# Patient Record
Sex: Male | Born: 1970 | Race: White | Hispanic: No | Marital: Married | Smoking: Former smoker
Health system: Southern US, Community
[De-identification: ages and names within clinical notes are randomized; demographics above are authoritative.]

## PROBLEM LIST (undated history)

## (undated) DIAGNOSIS — G2581 Restless legs syndrome: Secondary | ICD-10-CM

## (undated) DIAGNOSIS — E785 Hyperlipidemia, unspecified: Secondary | ICD-10-CM

## (undated) DIAGNOSIS — I10 Essential (primary) hypertension: Secondary | ICD-10-CM

## (undated) DIAGNOSIS — M549 Dorsalgia, unspecified: Secondary | ICD-10-CM

## (undated) DIAGNOSIS — G20 Parkinson's disease: Secondary | ICD-10-CM

## (undated) DIAGNOSIS — F4310 Post-traumatic stress disorder, unspecified: Secondary | ICD-10-CM

## (undated) DIAGNOSIS — G20A1 Parkinson's disease without dyskinesia, without mention of fluctuations: Secondary | ICD-10-CM

## (undated) DIAGNOSIS — M79609 Pain in unspecified limb: Secondary | ICD-10-CM

## (undated) DIAGNOSIS — J45909 Unspecified asthma, uncomplicated: Secondary | ICD-10-CM

## (undated) DIAGNOSIS — F329 Major depressive disorder, single episode, unspecified: Secondary | ICD-10-CM

## (undated) DIAGNOSIS — G4733 Obstructive sleep apnea (adult) (pediatric): Secondary | ICD-10-CM

## (undated) DIAGNOSIS — F319 Bipolar disorder, unspecified: Secondary | ICD-10-CM

## (undated) DIAGNOSIS — G259 Extrapyramidal and movement disorder, unspecified: Secondary | ICD-10-CM

## (undated) DIAGNOSIS — E559 Vitamin D deficiency, unspecified: Secondary | ICD-10-CM

## (undated) DIAGNOSIS — I219 Acute myocardial infarction, unspecified: Secondary | ICD-10-CM

## (undated) DIAGNOSIS — E119 Type 2 diabetes mellitus without complications: Secondary | ICD-10-CM

## (undated) HISTORY — PX: TONSILLECTOMY: SUR1361

## (undated) HISTORY — DX: Pain in unspecified limb: M79.609

## (undated) HISTORY — DX: Dorsalgia, unspecified: M54.9

## (undated) HISTORY — DX: Hyperlipidemia, unspecified: E78.5

## (undated) HISTORY — DX: Essential (primary) hypertension: I10

## (undated) HISTORY — DX: Obstructive sleep apnea (adult) (pediatric): G47.33

## (undated) HISTORY — DX: Unspecified asthma, uncomplicated: J45.909

## (undated) HISTORY — PX: KNEE SURGERY: SHX244

## (undated) HISTORY — DX: Extrapyramidal and movement disorder, unspecified: G25.9

## (undated) HISTORY — DX: Restless legs syndrome: G25.81

## (undated) HISTORY — PX: CHOLECYSTECTOMY: SHX55

## (undated) HISTORY — DX: Vitamin D deficiency, unspecified: E55.9

---

## 2013-08-14 HISTORY — PX: CARDIAC CATHETERIZATION: SHX172

## 2013-10-21 ENCOUNTER — Encounter: Payer: Self-pay | Admitting: *Deleted

## 2013-10-22 ENCOUNTER — Ambulatory Visit (INDEPENDENT_AMBULATORY_CARE_PROVIDER_SITE_OTHER): Payer: Medicaid Other | Admitting: Cardiology

## 2013-10-22 ENCOUNTER — Encounter: Payer: Self-pay | Admitting: Cardiology

## 2013-10-22 VITALS — BP 118/62 | HR 79 | Ht 68.0 in | Wt 208.0 lb

## 2013-10-22 DIAGNOSIS — R079 Chest pain, unspecified: Secondary | ICD-10-CM

## 2013-10-22 DIAGNOSIS — Z03810 Encounter for observation for suspected exposure to anthrax ruled out: Secondary | ICD-10-CM

## 2013-10-22 DIAGNOSIS — I10 Essential (primary) hypertension: Secondary | ICD-10-CM | POA: Insufficient documentation

## 2013-10-22 DIAGNOSIS — I200 Unstable angina: Secondary | ICD-10-CM

## 2013-10-22 DIAGNOSIS — E785 Hyperlipidemia, unspecified: Secondary | ICD-10-CM

## 2013-10-22 DIAGNOSIS — I2510 Atherosclerotic heart disease of native coronary artery without angina pectoris: Secondary | ICD-10-CM

## 2013-10-22 LAB — CBC WITH DIFFERENTIAL/PLATELET
Basophils Absolute: 0.1 10*3/uL (ref 0.0–0.1)
Basophils Relative: 0.7 % (ref 0.0–3.0)
Eosinophils Absolute: 0.2 10*3/uL (ref 0.0–0.7)
Eosinophils Relative: 2.4 % (ref 0.0–5.0)
HCT: 45.8 % (ref 39.0–52.0)
Hemoglobin: 15.6 g/dL (ref 13.0–17.0)
Lymphocytes Relative: 28.1 % (ref 12.0–46.0)
Lymphs Abs: 2.8 10*3/uL (ref 0.7–4.0)
MCHC: 34.1 g/dL (ref 30.0–36.0)
MCV: 94.4 fl (ref 78.0–100.0)
Monocytes Absolute: 1 10*3/uL (ref 0.1–1.0)
Monocytes Relative: 10 % (ref 3.0–12.0)
Neutro Abs: 5.8 10*3/uL (ref 1.4–7.7)
Neutrophils Relative %: 58.8 % (ref 43.0–77.0)
Platelets: 288 10*3/uL (ref 150.0–400.0)
RBC: 4.86 Mil/uL (ref 4.22–5.81)
RDW: 13.6 % (ref 11.5–14.6)
WBC: 9.8 10*3/uL (ref 4.5–10.5)

## 2013-10-22 LAB — BASIC METABOLIC PANEL
BUN: 11 mg/dL (ref 6–23)
CO2: 29 mEq/L (ref 19–32)
Calcium: 9.7 mg/dL (ref 8.4–10.5)
Chloride: 102 mEq/L (ref 96–112)
Creatinine, Ser: 0.9 mg/dL (ref 0.4–1.5)
GFR: 100.59 mL/min (ref >60.00)
Glucose, Bld: 90 mg/dL (ref 70–99)
Potassium: 4 mEq/L (ref 3.5–5.1)
Sodium: 137 mEq/L (ref 135–145)

## 2013-10-22 LAB — APTT: aPTT: 30 s — ABNORMAL HIGH (ref 21.7–28.8)

## 2013-10-22 MED ORDER — CARVEDILOL 3.125 MG PO TABS: 3.1250 mg | ORAL_TABLET | Freq: Two times a day (BID) | ORAL | Status: DC

## 2013-10-22 NOTE — Addendum Note (Signed)
Addended by: Lars MassonNELSON, Nurah Petrides H on: 10/22/2013 02:51 PM   Modules accepted: Orders

## 2013-10-22 NOTE — Patient Instructions (Addendum)
**Note De-Identified Samuel Williams Obfuscation** Your physician has requested that you have a cardiac catheterization. Cardiac catheterization is used to diagnose and/or treat various heart conditions. Doctors may recommend this procedure for a number of different reasons. The most common reason is to evaluate chest pain. Chest pain can be a symptom of coronary artery disease (CAD), and cardiac catheterization can show whether plaque is narrowing or blocking your heart's arteries. This procedure is also used to evaluate the valves, as well as measure the blood flow and oxygen levels in different parts of your heart. For further information please visit [email or web address] Please follow instruction sheet, as given.  Your physician has recommended you make the following change in your medication: start taking Carvedilol 3.125 twice daily   Your physician recommends that you return for lab work in: today  Your physician recommends that you schedule a follow-up appointment in: after cath

## 2013-10-22 NOTE — Addendum Note (Signed)
Addended by: Lars MassonNELSON, Slevin Gunby H on: 10/22/2013 02:19 PM   Modules accepted: Orders

## 2013-10-22 NOTE — Progress Notes (Signed)
Patient ID: Jahel A Berton, male   DOB: 08/06/1971, 43 y.o.   MRN: [id number]    Patient Name: Samuel Williams Date of Encounter: 10/22/2013  Primary Care Provider:  HAMRICK,MAURA L, MD Primary Cardiologist:  Dimond Crotty H  Problem List   Past Medical History  Diagnosis Date  . Restless legs syndrome (RLS)   . Obstructive sleep apnea (adult) (pediatric)   . Pain in limb   . 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 Cough   . 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 Other and unspecified hyperlipidemia   . Backache, unspecified   . Unspecified essential hypertension   . Diarrhea   . Unspecified vitamin D deficiency   . Pain in limb   . Unspecified asthma(493.90)   . Coronary atherosclerosis of unspecified type of vessel, native or graft    Past Surgical History  Procedure Laterality Date  . Cholecystectomy      Allergies  No Known Allergies  HPI  A 43-year-old male with prior medical history of hypertension, hyperlipidemia and known coronary artery disease. The patient states that in his early 30s he went to 2 different hospitals in [place] and was diagnosed with myocardial infarction on both occasions. He underwent cardiac catheterization that is not sure if he underwent any intervention or stenting. He moved to [place] 4 years ago and was followed with primary care physician only. For the last few months he has experienced profound fatigue and dyspnea on exertion. 3 weeks ago he had an episode of severe retrosternal chest pain that felt like his 2 prior episodes of myocardial infarction. At the time he was at the gas station he was also diaphoretic and was approached by an EMS personnel that found him confused and called EMS. He was taken to the [facility] where myocardial infarction was ruled out and an exercise nuclear stress test didn't show any ischemia. The patient has very significant family history of  coronary artery disease where every  male from the father's side has had a myocardial infarction as early as in their 20s. Exercise nuclear stress test showed normal ejection fraction, no EKG changes and no ischemia. No wall motion abnormalities. He exercised for 7 minutes on standard to do a Bruce protocol and achieved 8 METS.  Home Medications  Prior to Admission medications   Medication Sig Start Date End Date Taking? Authorizing Provider  aspirin 81 MG tablet Take 81 mg by mouth daily.   Yes Historical Provider, MD  cyclobenzaprine (FLEXERIL) 5 MG tablet Take 5 mg by mouth 3 (three) times daily as needed for muscle spasms.   Yes Historical Provider, MD  EPINEPHrine (EPIPEN 2-PAK) 0.3 mg/0.3 mL SOAJ injection Inject into the muscle once.   Yes Historical Provider, MD  gabapentin (NEURONTIN) 300 MG capsule Take 300 mg by mouth 3 (three) times daily.   Yes Historical Provider, MD  meloxicam (MOBIC) 15 MG tablet Take 15 mg by mouth daily.   Yes Historical Provider, MD  omeprazole (PRILOSEC) 20 MG capsule Take 20 mg by mouth daily.   Yes Historical Provider, MD  pravastatin (PRAVACHOL) 40 MG tablet Take 40 mg by mouth daily.   Yes Historical Provider, MD  primidone (MYSOLINE) 50 MG tablet Take by mouth 4 (four) times daily.   Yes Historical Provider, MD    Family History  Family History  Problem Relation Age of Onset  . Hypertension Father   . Heart disease Father   .   Hypercholesterolemia Father   . Thyroid disease Father   . Stroke Father   . Cancer Father   . Hypertension Paternal Grandfather   . Heart disease Paternal Grandfather   . Hypercholesterolemia Paternal Grandfather   . Stroke Paternal Grandfather   . Colon cancer Paternal Grandfather   . Diabetes Paternal Grandfather   . Hypertension Sister   . Hypercholesterolemia Sister   . Heart disease Maternal Grandfather   . Diabetes Maternal Grandfather   . Lung cancer Maternal Grandmother   . Diabetes Maternal Grandmother     . Diabetes Paternal Grandmother   . Arthritis    . Brain cancer Mother     Social History  History   Social History  . Marital Status: Single    Spouse Name: N/A    Number of Children: N/A  . Years of Education: N/A   Occupational History  . Not on file.   Social History Main Topics  . Smoking status: Former Smoker  . Smokeless tobacco: Current User    Types: Chew  . Alcohol Use: Yes     Comment: occasional  . Drug Use: No  . Sexual Activity: Not on file   Other Topics Concern  . Not on file   Social History Narrative  . No narrative on file     Review of Systems, as per HPI, otherwise negative General:  No chills, fever, night sweats or weight changes.  Cardiovascular:  No chest pain, dyspnea on exertion, edema, orthopnea, palpitations, paroxysmal nocturnal dyspnea. Dermatological: No rash, lesions/masses Respiratory: No cough, dyspnea Urologic: No hematuria, dysuria Abdominal:   No nausea, vomiting, diarrhea, bright red blood per rectum, melena, or hematemesis Neurologic:  No visual changes, wkns, changes in mental status. All other systems reviewed and are otherwise negative except as noted above.  Physical Exam  Blood pressure 118/62, pulse 79, height 5' 8" (1.727 m), weight 208 lb (94.348 kg).  General: Pleasant, NAD Psych: Normal affect. Neuro: Alert and oriented X 3. Moves all extremities spontaneously. HEENT: Normal  Neck: Supple without bruits or JVD. Lungs:  Resp regular and unlabored, CTA. Heart: RRR no s3, s4, or murmurs. Abdomen: Soft, non-tender, non-distended, BS + x 4.  Extremities: No clubbing, cyanosis or edema. DP/PT/Radials 2+ and equal bilaterally.  Labs:  Accessory Clinical Findings  echocardiogram  ECG - SR, normal ECG   Assessment & Plan  42-year-old male  1. CAD - now typical chest pain and profound fatigue, prior history of coronary artery disease and myocardial infarction. The patient's EKG is normal and his stress  test is negative, however based on symptoms and history I think we need to Pain to evaluate for his abdomen he and plan for future management. We will continue aspirin, pravastatin, and add carvedilol 3.125 mg twice a day.  2. Hypertension - controlled  3. Hyperlipidemia - currently on pravastatin we'll recheck  We will plan for left cardiac catheterization on Tuesday, 10/28/2013. Followup after cardiac catheterization.   Lizbett Garciagarcia H, MD, FACC 10/22/2013, 10:01 AM     

## 2013-10-28 ENCOUNTER — Encounter (HOSPITAL_COMMUNITY)
Admission: RE | Disposition: A | Discharge status: Home / Self Care | Payer: Self-pay | Source: Ambulatory Visit | Attending: Cardiology

## 2013-10-28 ENCOUNTER — Ambulatory Visit (HOSPITAL_COMMUNITY)
Admission: RE | Admit: 2013-10-28 | Discharge: 2013-10-28 | Disposition: A | Discharge status: Home / Self Care | Payer: Medicaid Other | Source: Ambulatory Visit | Attending: Cardiology | Admitting: Cardiology

## 2013-10-28 DIAGNOSIS — Z8249 Family history of ischemic heart disease and other diseases of the circulatory system: Secondary | ICD-10-CM | POA: Insufficient documentation

## 2013-10-28 DIAGNOSIS — M79609 Pain in unspecified limb: Secondary | ICD-10-CM | POA: Insufficient documentation

## 2013-10-28 DIAGNOSIS — G252 Other specified forms of tremor: Secondary | ICD-10-CM

## 2013-10-28 DIAGNOSIS — Z87891 Personal history of nicotine dependence: Secondary | ICD-10-CM | POA: Insufficient documentation

## 2013-10-28 DIAGNOSIS — I2510 Atherosclerotic heart disease of native coronary artery without angina pectoris: Secondary | ICD-10-CM | POA: Insufficient documentation

## 2013-10-28 DIAGNOSIS — G4733 Obstructive sleep apnea (adult) (pediatric): Secondary | ICD-10-CM | POA: Insufficient documentation

## 2013-10-28 DIAGNOSIS — R0609 Other forms of dyspnea: Secondary | ICD-10-CM | POA: Insufficient documentation

## 2013-10-28 DIAGNOSIS — E785 Hyperlipidemia, unspecified: Secondary | ICD-10-CM | POA: Insufficient documentation

## 2013-10-28 DIAGNOSIS — R079 Chest pain, unspecified: Secondary | ICD-10-CM | POA: Insufficient documentation

## 2013-10-28 DIAGNOSIS — J45909 Unspecified asthma, uncomplicated: Secondary | ICD-10-CM | POA: Insufficient documentation

## 2013-10-28 DIAGNOSIS — I252 Old myocardial infarction: Secondary | ICD-10-CM | POA: Insufficient documentation

## 2013-10-28 DIAGNOSIS — R0989 Other specified symptoms and signs involving the circulatory and respiratory systems: Secondary | ICD-10-CM | POA: Insufficient documentation

## 2013-10-28 DIAGNOSIS — G250 Essential tremor: Secondary | ICD-10-CM | POA: Insufficient documentation

## 2013-10-28 DIAGNOSIS — M549 Dorsalgia, unspecified: Secondary | ICD-10-CM | POA: Insufficient documentation

## 2013-10-28 DIAGNOSIS — G2581 Restless legs syndrome: Secondary | ICD-10-CM | POA: Insufficient documentation

## 2013-10-28 DIAGNOSIS — E559 Vitamin D deficiency, unspecified: Secondary | ICD-10-CM | POA: Insufficient documentation

## 2013-10-28 DIAGNOSIS — Z7982 Long term (current) use of aspirin: Secondary | ICD-10-CM | POA: Insufficient documentation

## 2013-10-28 DIAGNOSIS — I10 Essential (primary) hypertension: Secondary | ICD-10-CM | POA: Insufficient documentation

## 2013-10-28 HISTORY — PX: LEFT HEART CATHETERIZATION WITH CORONARY ANGIOGRAM: SHX5451

## 2013-10-28 LAB — PROTIME-INR
INR: 1.02 (ref 0.00–1.49)
Prothrombin Time: 13.2 seconds (ref 11.6–15.2)

## 2013-10-28 SURGERY — LEFT HEART CATHETERIZATION WITH CORONARY ANGIOGRAM
Anesthesia: LOCAL

## 2013-10-28 MED ORDER — VERAPAMIL HCL 2.5 MG/ML IV SOLN
INTRAVENOUS | Status: AC
  Filled 2013-10-28: qty 2

## 2013-10-28 MED ORDER — SODIUM CHLORIDE 0.9 % IV SOLN
INTRAVENOUS | Status: DC
  Administered 2013-10-28: 07:00:00 via INTRAVENOUS

## 2013-10-28 MED ORDER — NITROGLYCERIN 0.2 MG/ML ON CALL CATH LAB
INTRAVENOUS | Status: AC
  Filled 2013-10-28: qty 1

## 2013-10-28 MED ORDER — HEPARIN (PORCINE) IN NACL 2-0.9 UNIT/ML-% IJ SOLN
INTRAMUSCULAR | Status: AC
  Filled 2013-10-28: qty 1000

## 2013-10-28 MED ORDER — LIDOCAINE HCL (PF) 1 % IJ SOLN
INTRAMUSCULAR | Status: AC
  Filled 2013-10-28: qty 30

## 2013-10-28 MED ORDER — ASPIRIN 81 MG PO CHEW
81.0000 mg | CHEWABLE_TABLET | ORAL | Status: DC
  Filled 2013-10-28: qty 1

## 2013-10-28 MED ORDER — MIDAZOLAM HCL 2 MG/2ML IJ SOLN
INTRAMUSCULAR | Status: AC
  Filled 2013-10-28: qty 2

## 2013-10-28 MED ORDER — SODIUM CHLORIDE 0.9 % IV SOLN
250.0000 mL | INTRAVENOUS | Status: DC | PRN
Start: 2013-10-28 — End: 2013-10-28

## 2013-10-28 MED ORDER — HEPARIN SODIUM (PORCINE) 1000 UNIT/ML IJ SOLN
INTRAMUSCULAR | Status: AC
  Filled 2013-10-28: qty 1

## 2013-10-28 MED ORDER — SODIUM CHLORIDE 0.9 % IJ SOLN
3.0000 mL | INTRAMUSCULAR | Status: DC | PRN
Start: 2013-10-28 — End: 2013-10-28

## 2013-10-28 MED ORDER — FENTANYL CITRATE 0.05 MG/ML IJ SOLN
INTRAMUSCULAR | Status: AC
  Filled 2013-10-28: qty 2

## 2013-10-28 MED ORDER — SODIUM CHLORIDE 0.9 % IV SOLN: 1.0000 mL/kg/h | INTRAVENOUS | Status: DC

## 2013-10-28 MED ORDER — SODIUM CHLORIDE 0.9 % IJ SOLN: 3.0000 mL | Freq: Two times a day (BID) | INTRAMUSCULAR | Status: DC

## 2013-10-28 NOTE — H&P (View-Only) (Signed)
Patient ID: Samuel CrownMichael A Crownover, male   DOB: 30-Dec-1970, 43 y.o.   MRN: [id number]    Patient Name: Samuel Williams Date of Encounter: 10/22/2013  Primary Care Provider:  Ailene RavelHAMRICK,MAURA L, MD Primary Cardiologist:  Lars MassonNELSON, Jeziah Kretschmer H  Problem List   Past Medical History  Diagnosis Date  . Restless legs syndrome (RLS)   . Obstructive sleep apnea (adult) (pediatric)   . Pain in limb   . 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 Cough   . 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 Other and unspecified hyperlipidemia   . Backache, unspecified   . Unspecified essential hypertension   . Diarrhea   . Unspecified vitamin D deficiency   . Pain in limb   . Unspecified asthma(493.90)   . Coronary atherosclerosis of unspecified type of vessel, native or graft    Past Surgical History  Procedure Laterality Date  . Cholecystectomy      Allergies  No Known Allergies  HPI  A 43 year old male with prior medical history of hypertension, hyperlipidemia and known coronary artery disease. The patient states that in his early 1930s he went to 2 different hospitals in [place] and was diagnosed with myocardial infarction on both occasions. He underwent cardiac catheterization that is not sure if he underwent any intervention or stenting. He moved to [place] 4 years ago and was followed with primary care physician only. For the last few months he has experienced profound fatigue and dyspnea on exertion. 3 weeks ago he had an episode of severe retrosternal chest pain that felt like his 2 prior episodes of myocardial infarction. At the time he was at the gas station he was also diaphoretic and was approached by an EMS personnel that found him confused and called EMS. [facility] where myocardial infarction was ruled out and an exercise nuclear stress test didn't show any ischemia. The patient has very significant family history of  coronary artery disease where every  male from the father's side has had a myocardial infarction as early as in their 5220s. Exercise nuclear stress test showed normal ejection fraction, no EKG changes and no ischemia. No wall motion abnormalities. He exercised for 7 minutes on standard to do a Bruce protocol and achieved 8 METS.  Home Medications  Prior to Admission medications   Medication Sig Start Date End Date Taking? Authorizing Provider  aspirin 81 MG tablet Take 81 mg by mouth daily.   Yes Historical Provider, MD  cyclobenzaprine (FLEXERIL) 5 MG tablet Take 5 mg by mouth 3 (three) times daily as needed for muscle spasms.   Yes Historical Provider, MD  EPINEPHrine (EPIPEN 2-PAK) 0.3 mg/0.3 mL SOAJ injection Inject into the muscle once.   Yes Historical Provider, MD  gabapentin (NEURONTIN) 300 MG capsule Take 300 mg by mouth 3 (three) times daily.   Yes Historical Provider, MD  meloxicam (MOBIC) 15 MG tablet Take 15 mg by mouth daily.   Yes Historical Provider, MD  omeprazole (PRILOSEC) 20 MG capsule Take 20 mg by mouth daily.   Yes Historical Provider, MD  pravastatin (PRAVACHOL) 40 MG tablet Take 40 mg by mouth daily.   Yes Historical Provider, MD  primidone (MYSOLINE) 50 MG tablet Take by mouth 4 (four) times daily.   Yes Historical Provider, MD    Family History  Family History  Problem Relation Age of Onset  . Hypertension Father   . Heart disease Father   .  Hypercholesterolemia Father   . Thyroid disease Father   . Stroke Father   . Cancer Father   . Hypertension Paternal Grandfather   . Heart disease Paternal Grandfather   . Hypercholesterolemia Paternal Grandfather   . Stroke Paternal Grandfather   . Colon cancer Paternal Grandfather   . Diabetes Paternal Grandfather   . Hypertension Sister   . Hypercholesterolemia Sister   . Heart disease Maternal Grandfather   . Diabetes Maternal Grandfather   . Lung cancer Maternal Grandmother   . Diabetes Maternal Grandmother     . Diabetes Paternal Grandmother   . Arthritis    . Brain cancer Mother     Social History  History   Social History  . Marital Status: Single    Spouse Name: N/A    Number of Children: N/A  . Years of Education: N/A   Occupational History  . Not on file.   Social History Main Topics  . Smoking status: Former Games developer  . Smokeless tobacco: Current User    Types: Chew  . Alcohol Use: Yes     Comment: occasional  . Drug Use: No  . Sexual Activity: Not on file   Other Topics Concern  . Not on file   Social History Narrative  . No narrative on file     Review of Systems, as per HPI, otherwise negative General:  No chills, fever, night sweats or weight changes.  Cardiovascular:  No chest pain, dyspnea on exertion, edema, orthopnea, palpitations, paroxysmal nocturnal dyspnea. Dermatological: No rash, lesions/masses Respiratory: No cough, dyspnea Urologic: No hematuria, dysuria Abdominal:   No nausea, vomiting, diarrhea, bright red blood per rectum, melena, or hematemesis Neurologic:  No visual changes, wkns, changes in mental status. All other systems reviewed and are otherwise negative except as noted above.  Physical Exam  Blood pressure 118/62, pulse 79, height 5\' 8"  (1.727 m), weight 208 lb (94.348 kg).  General: Pleasant, NAD Psych: Normal affect. Neuro: Alert and oriented X 3. Moves all extremities spontaneously. HEENT: Normal  Neck: Supple without bruits or JVD. Lungs:  Resp regular and unlabored, CTA. Heart: RRR no s3, s4, or murmurs. Abdomen: Soft, non-tender, non-distended, BS + x 4.  Extremities: No clubbing, cyanosis or edema. DP/PT/Radials 2+ and equal bilaterally.  Labs:  Accessory Clinical Findings  echocardiogram  ECG - SR, normal ECG   Assessment & Plan  43 year old male  1. CAD - now typical chest pain and profound fatigue, prior history of coronary artery disease and myocardial infarction. The patient's EKG is normal and his stress  test is negative, however based on symptoms and history I think we need to Pain to evaluate for his abdomen he and plan for future management. We will continue aspirin, pravastatin, and add carvedilol 3.125 mg twice a day.  2. Hypertension - controlled  3. Hyperlipidemia - currently on pravastatin we'll recheck  We will plan for left cardiac catheterization on Tuesday, 10/28/2013. Followup after cardiac catheterization.   Lars Masson, MD, [facility] 10/22/2013, 10:01 AM

## 2013-10-28 NOTE — CV Procedure (Signed)
    Cardiac Catheterization Procedure Note  Name: Samuel Williams MRN: [id number] DOB: Feb 03, 1971  Procedure: Left Heart Cath, Selective Coronary Angiography, LV angiography  Indication: 43 yo WM with history of HTN and hyperlipidemia presents with symptoms of chest pain, fatigue, and dyspnea. Prior Myoview study was normal. Cardiac cath in 2004 in [place] was normal.   Procedural Details: The right wrist was prepped, draped, and anesthetized with 1% lidocaine. Using the modified Seldinger technique, a 5 French sheath was introduced into the right radial artery. 3 mg of verapamil was administered through the sheath, weight-based unfractionated heparin was administered intravenously. Standard Judkins catheters were used for selective coronary angiography and left ventriculography. Catheter exchanges were performed over an exchange length guidewire. There were no immediate procedural complications. A TR band was used for radial hemostasis at the completion of the procedure.  The patient was transferred to the post catheterization recovery area for further monitoring.  Procedural Findings: Hemodynamics: AO 127/80 mean 107 mm Hg LV 133/16 mm Hg  Coronary angiography: Coronary dominance: right  Left mainstem: Normal  Left anterior descending (LAD): Focal 20% mid LAD  Left circumflex (LCx): Normal  Right coronary artery (RCA): Normal  Left ventriculography: Left ventricular systolic function is normal, LVEF is estimated at 55-65%, there is moderate MR post PVC that clears with normal beats.  Final Conclusions:   1. No significant CAD 2. Normal LV function.  Recommendations: Consider other noncardiac reasons for patient's symptoms.  Theron Aristaeter [facility],FACC 10/28/2013, 8:59 AM

## 2013-10-28 NOTE — Progress Notes (Signed)
TRB band removed and 2x2 applied with occlusive dressing.  Site unremarkable.  Will monitor prior to discharge.

## 2013-10-28 NOTE — Progress Notes (Signed)
Received from cath lab via stretcher.  Pt transferred to recliner without difficulty.  Awake, A&O.  Right wrist site CDI with TRB.  Post procedure instructions reviewed with pt and significant other.  Understanding voiced.  Po's offered, call bell in reach.  Will continue to monitor.

## 2013-10-28 NOTE — Interval H&P Note (Signed)
History and Physical Interval Note:  10/28/2013 8:31 AM  Samuel Williams  has presented today for surgery, with the diagnosis of cp  The various methods of treatment have been discussed with the patient and family. After consideration of risks, benefits and other options for treatment, the patient has consented to  Procedure(s): LEFT HEART CATHETERIZATION WITH CORONARY ANGIOGRAM (N/A) as a surgical intervention .  The patient's history has been reviewed, patient examined, no change in status, stable for surgery.  I have reviewed the patient's chart and labs.  Questions were answered to the patient's satisfaction.    Cath Lab Visit (complete for each Cath Lab visit)  Clinical Evaluation Leading to the Procedure:   ACS: no  Non-ACS:    Anginal Classification: CCS III  Anti-ischemic medical therapy: Minimal Therapy (1 class of medications)  Non-Invasive Test Results: Low-risk stress test findings: cardiac mortality <1%/year  Prior CABG: No previous CABG       Theron Aristaeter [facility],FACC 10/28/2013 8:31 AM

## 2013-10-28 NOTE — Discharge Instructions (Signed)

## 2014-05-01 ENCOUNTER — Encounter (HOSPITAL_COMMUNITY): Payer: Self-pay | Admitting: Emergency Medicine

## 2014-05-01 ENCOUNTER — Emergency Department (HOSPITAL_COMMUNITY)
Admission: EM | Admit: 2014-05-01 | Discharge: 2014-05-02 | Disposition: A | Discharge status: Home / Self Care | Payer: Medicaid Other | Attending: Emergency Medicine | Admitting: Emergency Medicine

## 2014-05-01 ENCOUNTER — Emergency Department (HOSPITAL_COMMUNITY): Payer: Medicaid Other

## 2014-05-01 DIAGNOSIS — Z8669 Personal history of other diseases of the nervous system and sense organs: Secondary | ICD-10-CM | POA: Diagnosis not present

## 2014-05-01 DIAGNOSIS — I10 Essential (primary) hypertension: Secondary | ICD-10-CM | POA: Insufficient documentation

## 2014-05-01 DIAGNOSIS — Z87891 Personal history of nicotine dependence: Secondary | ICD-10-CM | POA: Insufficient documentation

## 2014-05-01 DIAGNOSIS — R61 Generalized hyperhidrosis: Secondary | ICD-10-CM | POA: Insufficient documentation

## 2014-05-01 DIAGNOSIS — Z7982 Long term (current) use of aspirin: Secondary | ICD-10-CM | POA: Diagnosis not present

## 2014-05-01 DIAGNOSIS — R0789 Other chest pain: Secondary | ICD-10-CM | POA: Diagnosis not present

## 2014-05-01 DIAGNOSIS — R079 Chest pain, unspecified: Secondary | ICD-10-CM | POA: Diagnosis present

## 2014-05-01 DIAGNOSIS — IMO0002 Reserved for concepts with insufficient information to code with codable children: Secondary | ICD-10-CM | POA: Diagnosis not present

## 2014-05-01 DIAGNOSIS — J45909 Unspecified asthma, uncomplicated: Secondary | ICD-10-CM | POA: Diagnosis not present

## 2014-05-01 DIAGNOSIS — I2510 Atherosclerotic heart disease of native coronary artery without angina pectoris: Secondary | ICD-10-CM | POA: Diagnosis not present

## 2014-05-01 DIAGNOSIS — E785 Hyperlipidemia, unspecified: Secondary | ICD-10-CM | POA: Diagnosis not present

## 2014-05-01 DIAGNOSIS — R110 Nausea: Secondary | ICD-10-CM | POA: Insufficient documentation

## 2014-05-01 LAB — COMPREHENSIVE METABOLIC PANEL
ALT: 45 U/L (ref 0–53)
ANION GAP: 15 (ref 5–15)
AST: 45 U/L — AB (ref 0–37)
Albumin: 3.6 g/dL (ref 3.5–5.2)
Alkaline Phosphatase: 71 U/L (ref 39–117)
BILIRUBIN TOTAL: 0.5 mg/dL (ref 0.3–1.2)
BUN: 8 mg/dL (ref 6–23)
CHLORIDE: 105 meq/L (ref 96–112)
CO2: 23 mEq/L (ref 19–32)
CREATININE: 0.79 mg/dL (ref 0.50–1.35)
Calcium: 8.8 mg/dL (ref 8.4–10.5)
GFR calc Af Amer: >90 mL/min (ref >90)
GFR calc non Af Amer: >90 mL/min (ref >90)
Glucose, Bld: 90 mg/dL (ref 70–99)
Potassium: 3.5 mEq/L — ABNORMAL LOW (ref 3.7–5.3)
Sodium: 143 mEq/L (ref 137–147)
Total Protein: 6.5 g/dL (ref 6.0–8.3)

## 2014-05-01 LAB — URINALYSIS, ROUTINE W REFLEX MICROSCOPIC
BILIRUBIN URINE: NEGATIVE
Glucose, UA: NEGATIVE mg/dL
Hgb urine dipstick: NEGATIVE
Ketones, ur: NEGATIVE mg/dL
LEUKOCYTES UA: NEGATIVE
NITRITE: NEGATIVE
Protein, ur: NEGATIVE mg/dL
SPECIFIC GRAVITY, URINE: 1.008 (ref 1.005–1.030)
UROBILINOGEN UA: 0.2 mg/dL (ref 0.0–1.0)
pH: 5.5 (ref 5.0–8.0)

## 2014-05-01 LAB — TROPONIN I

## 2014-05-01 LAB — CBC
HCT: 43.3 % (ref 39.0–52.0)
Hemoglobin: 15.1 g/dL (ref 13.0–17.0)
MCH: 32.7 pg (ref 26.0–34.0)
MCHC: 34.9 g/dL (ref 30.0–36.0)
MCV: 93.7 fL (ref 78.0–100.0)
PLATELETS: 267 10*3/uL (ref 150–400)
RBC: 4.62 MIL/uL (ref 4.22–5.81)
RDW: 13 % (ref 11.5–15.5)
WBC: 9.7 10*3/uL (ref 4.0–10.5)

## 2014-05-01 LAB — I-STAT TROPONIN, ED: TROPONIN I, POC: 0 ng/mL (ref 0.00–0.08)

## 2014-05-01 IMAGING — CR DG CHEST 2V
2 series · 2 of 2 positions shown · non-contrast
Comparison: [DATE]

CLINICAL DATA: Chest pain

EXAM:
CHEST  2 VIEW

[w chest pa]
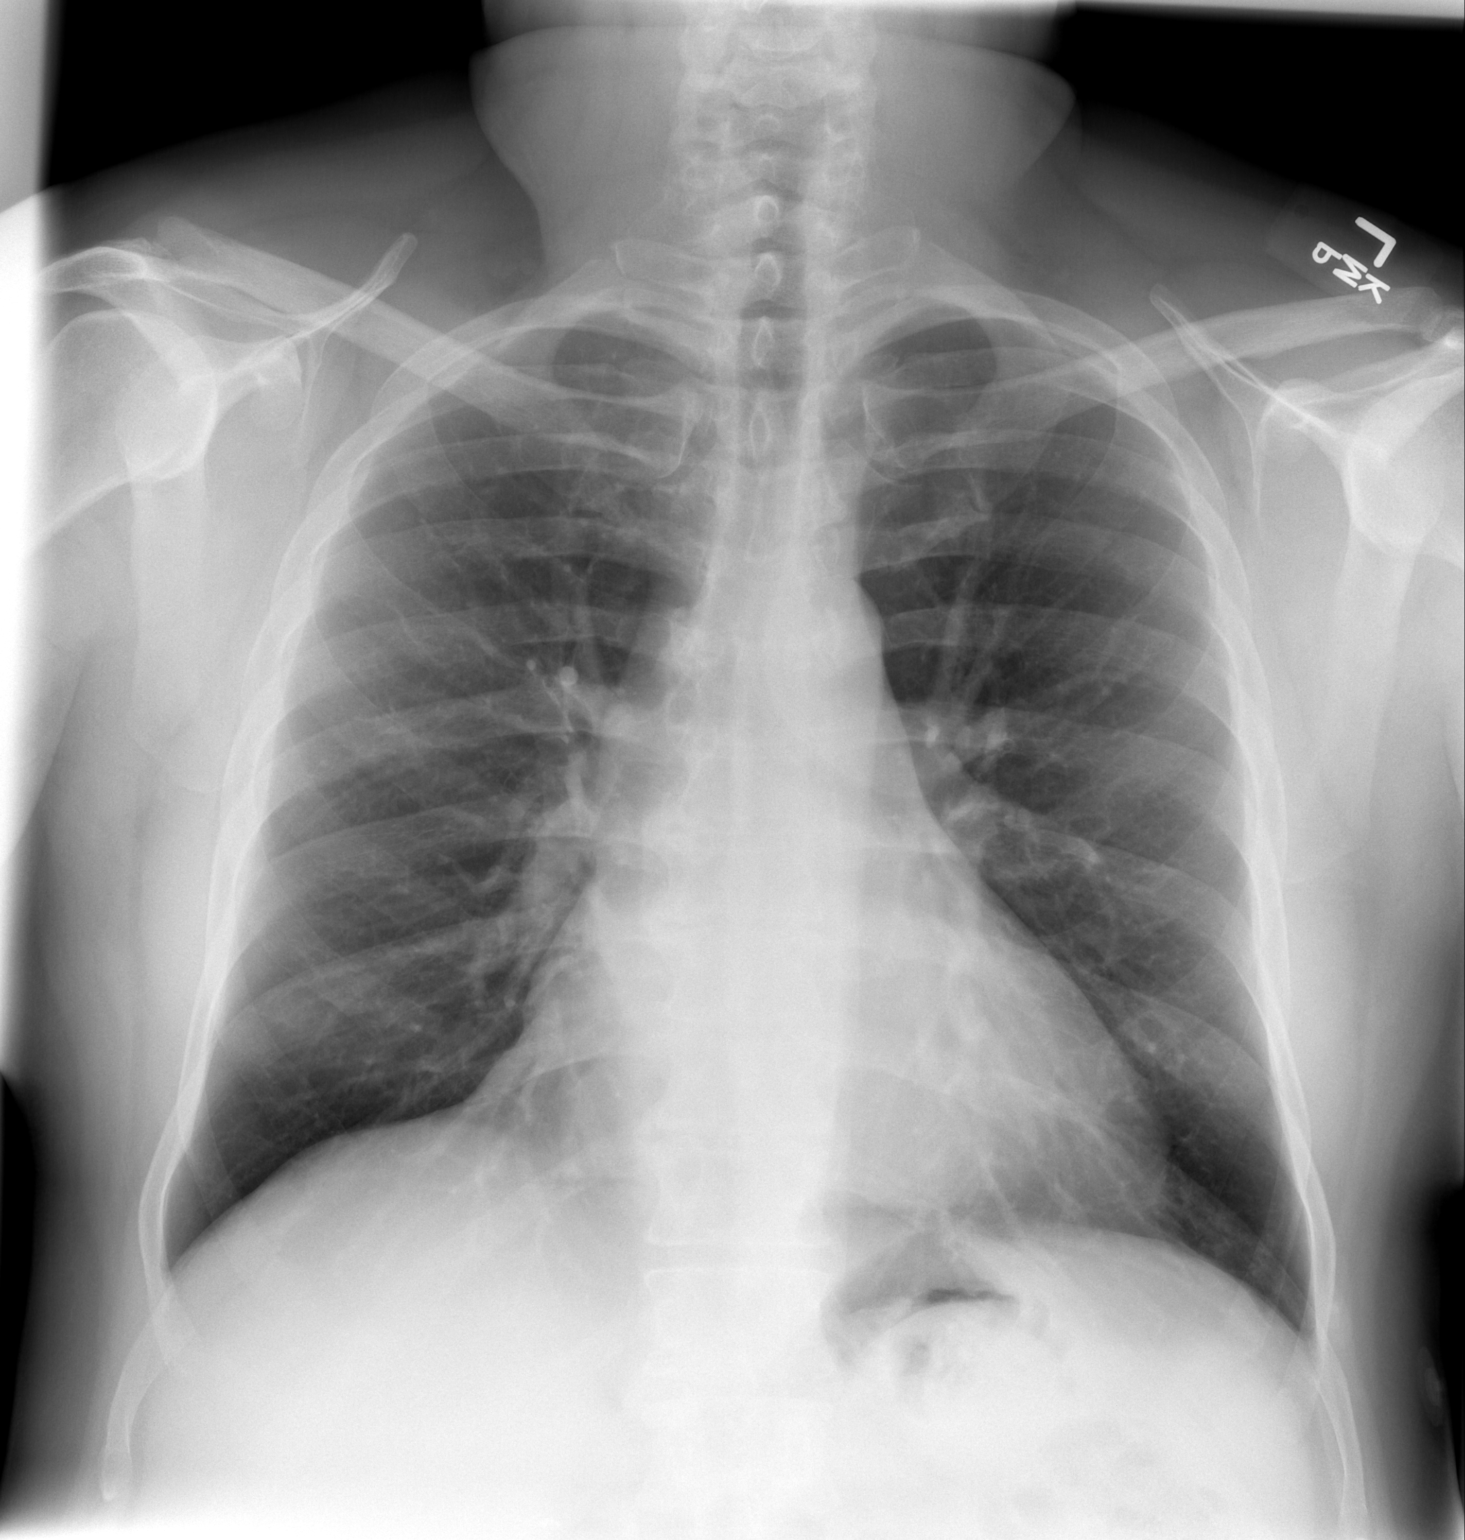

[w chest lat]
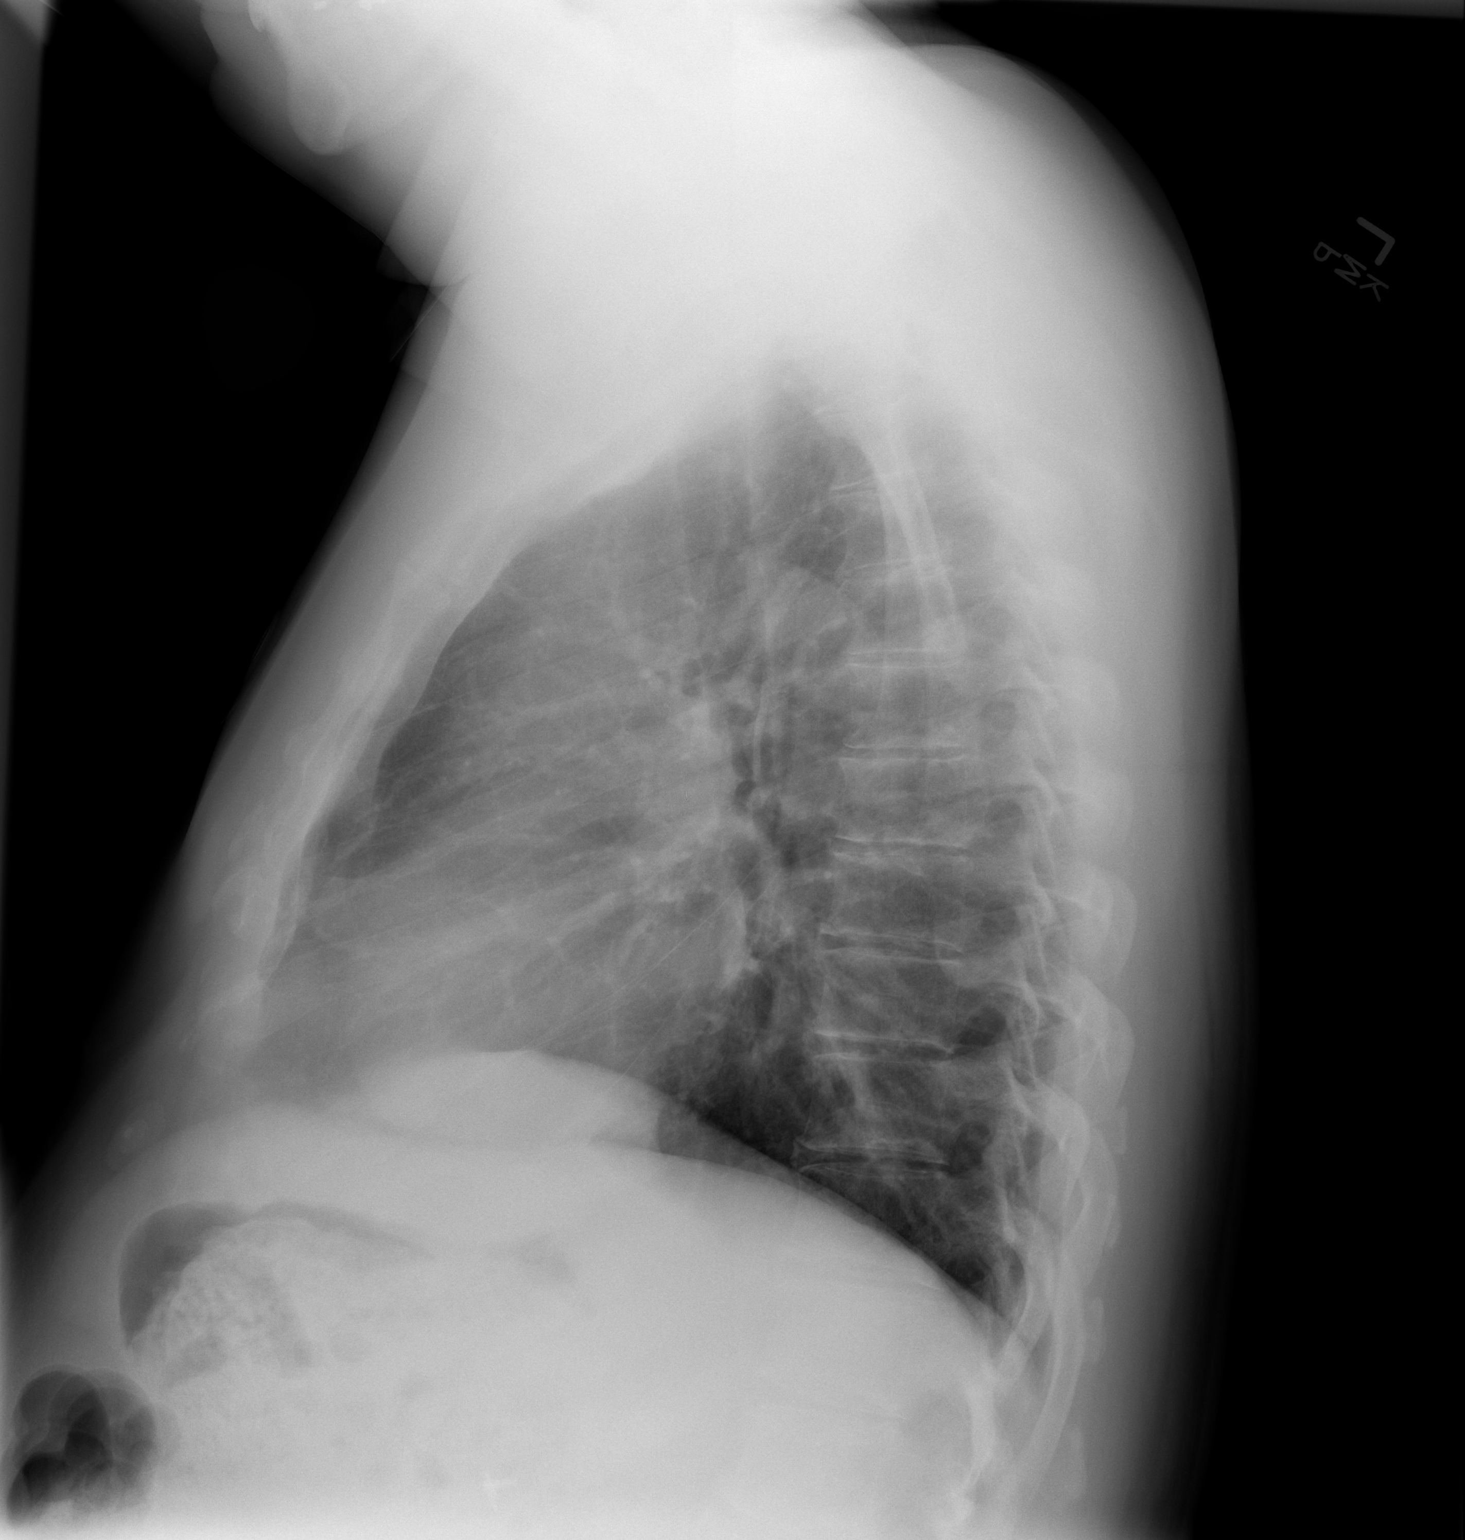

[2 of 2 positions shown; findings below may reference images not displayed]

FINDINGS: The heart size and mediastinal contours are within normal limits.
Both lungs are clear. The visualized skeletal structures are
unremarkable.
IMPRESSION: No active cardiopulmonary disease.

## 2014-05-01 MED ORDER — ASPIRIN 81 MG PO CHEW
324.0000 mg | CHEWABLE_TABLET | Freq: Once | ORAL | Status: AC
  Administered 2014-05-01: 324 mg via ORAL
  Filled 2014-05-01 (×3): qty 4

## 2014-05-01 MED ORDER — NITROGLYCERIN 2 % TD OINT
1.0000 [in_us] | TOPICAL_OINTMENT | Freq: Once | TRANSDERMAL | Status: AC
  Administered 2014-05-01: 1 [in_us] via TOPICAL
  Filled 2014-05-01 (×2): qty 1

## 2014-05-01 NOTE — ED Notes (Signed)
The pt wants to go home.  He does not want to wait for lab work.  hes ready to go

## 2014-05-01 NOTE — ED Provider Notes (Signed)
CSN: [id number]     Arrival date & time 05/01/14  1923 History   First MD Initiated Contact with Patient 05/01/14 1923     Chief Complaint  Patient presents with  . Chest Pain     (Consider location/radiation/quality/duration/timing/severity/associated sxs/prior Treatment) The history is provided by the patient and medical records. No language interpreter was used.    Samuel Williams is a 43 y.o. male  with a hx of MI (age 50, 64, 89, 37)  presents to the Emergency Department complaining of gradual, persistent, progressively worsening chest pressure onset around 6pm rapidly worsening within 10 minutes.  Last MI in Feb 2015.  Pt reports tonight he had crushing chest pressure that radiated down his right arm that was not relieved with 2 rounds of nitro.  Pt reports he feels better now, but is still having some chest pressure/tightness.  Associated symptoms include nausea, sweaty palms, dizziness.  Pt took 1 baby ASA before calling 911.  Nitro makes the chest pressure better and exertion makes it worse.  Pt denies fever, chills, headache, neck pain, dental pain, vomiting, diarrhea, syncope, near syncope, dysuria, hematuria.    Cardiologist: [facility] PCP: [facility]  Past Medical History  Diagnosis Date  . Restless legs syndrome (RLS)   . Obstructive sleep apnea (adult) (pediatric)   . Pain in limb   . 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 Cough   . 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 Other and unspecified hyperlipidemia   . Backache, unspecified   . Unspecified essential hypertension   . Diarrhea   . Unspecified vitamin D deficiency   . Pain in limb   . Unspecified asthma(493.90)   . Coronary atherosclerosis of unspecified type of vessel, native or graft    Past Surgical History  Procedure Laterality Date  . Cholecystectomy     Family History  Problem Relation Age of Onset  . Hypertension Father   . Heart  disease Father   . Hypercholesterolemia Father   . Thyroid disease Father   . Stroke Father   . Cancer Father   . Hypertension Paternal Grandfather   . Heart disease Paternal Grandfather   . Hypercholesterolemia Paternal Grandfather   . Stroke Paternal Grandfather   . Colon cancer Paternal Grandfather   . Diabetes Paternal Grandfather   . Hypertension Sister   . Hypercholesterolemia Sister   . Heart disease Maternal Grandfather   . Diabetes Maternal Grandfather   . Lung cancer Maternal Grandmother   . Diabetes Maternal Grandmother   . Diabetes Paternal Grandmother   . Arthritis    . Brain cancer Mother    History  Substance Use Topics  . Smoking status: Former Games developer  . Smokeless tobacco: Current User    Types: Chew  . Alcohol Use: Yes     Comment: occasional    Review of Systems  Constitutional: Positive for diaphoresis. Negative for fever, appetite change, fatigue and unexpected weight change.  HENT: Negative for mouth sores.   Eyes: Negative for visual disturbance.  Respiratory: Positive for chest tightness. Negative for cough, shortness of breath and wheezing.   Cardiovascular: Positive for chest pain.  Gastrointestinal: Positive for nausea. Negative for vomiting, abdominal pain, diarrhea and constipation.  Endocrine: Negative for polydipsia, polyphagia and polyuria.  Genitourinary: Negative for dysuria, urgency, frequency and hematuria.  Musculoskeletal: Negative for back pain and neck stiffness.  Skin: Negative for rash.  Allergic/Immunologic: Negative for immunocompromised state.  Neurological:  Negative for syncope, light-headedness and headaches.  Hematological: Does not bruise/bleed easily.  Psychiatric/Behavioral: Negative for sleep disturbance. The patient is not nervous/anxious.       Allergies  Bee venom and Mushroom extract complex  Home Medications   Prior to Admission medications   Medication Sig Start Date End Date Taking? Authorizing Provider   aspirin 81 MG tablet Take 81 mg by mouth daily.   Yes Historical Provider, MD  cyclobenzaprine (FLEXERIL) 5 MG tablet Take 5 mg by mouth 3 (three) times daily as needed for muscle spasms.   Yes Historical Provider, MD  EPINEPHrine (EPIPEN 2-PAK) 0.3 mg/0.3 mL SOAJ injection Inject 0.3 mg into the muscle once.    Yes Historical Provider, MD  meloxicam (MOBIC) 15 MG tablet Take 15 mg by mouth daily.   Yes Historical Provider, MD  nitroGLYCERIN (NITROSTAT) 0.4 MG SL tablet Place 0.4 mg under the tongue every 5 (five) minutes as needed for chest pain.   Yes Historical Provider, MD  pravastatin (PRAVACHOL) 40 MG tablet Take 40 mg by mouth daily.   Yes Historical Provider, MD  primidone (MYSOLINE) 50 MG tablet Take 300 mg by mouth 2 (two) times daily.    Yes Historical Provider, MD   BP 112/71  Pulse 84  Temp(Src) 98.1 F (36.7 C) (Oral)  Resp 16  SpO2 100% Physical Exam  Nursing note and vitals reviewed. Constitutional: He appears well-developed and well-nourished. No distress.  Awake, alert, nontoxic appearance  HENT:  Head: Normocephalic and atraumatic.  Mouth/Throat: Oropharynx is clear and moist. No oropharyngeal exudate.  Eyes: Conjunctivae are normal. No scleral icterus.  Neck: Normal range of motion. Neck supple.  Cardiovascular: Normal rate, regular rhythm, normal heart sounds and intact distal pulses.   No murmur heard. Pulmonary/Chest: Effort normal and breath sounds normal. No respiratory distress. He has no wheezes.  Equal chest expansion Clear and equal breath sounds  Abdominal: Soft. Bowel sounds are normal. He exhibits no mass. There is no tenderness. There is no rebound and no guarding.  Musculoskeletal: Normal range of motion. He exhibits no edema.  Neurological: He is alert.  Speech is clear and goal oriented Moves extremities without ataxia  Skin: Skin is warm and dry. He is not diaphoretic.  Psychiatric: He has a normal mood and affect.    ED Course  Procedures  (including critical care time) Labs Review Labs Reviewed  COMPREHENSIVE METABOLIC PANEL - Abnormal; Notable for the following:    Potassium 3.5 (*)    AST 45 (*)    All other components within normal limits  TROPONIN I  URINALYSIS, ROUTINE W REFLEX MICROSCOPIC  CBC  TROPONIN I  I-STAT TROPOININ, ED  Rosezena Sensor, ED    Imaging Review Dg Chest 2 View  05/01/2014   CLINICAL DATA:  Chest pain  EXAM: CHEST  2 VIEW  COMPARISON:  09/17/2013  FINDINGS: The heart size and mediastinal contours are within normal limits. Both lungs are clear. The visualized skeletal structures are unremarkable.  IMPRESSION: No active cardiopulmonary disease.   Electronically Signed   By: Amie Portland M.D.   On: 05/01/2014 20:56     EKG Interpretation   Date/Time:  Friday May 01 2014 19:37:26 EDT Ventricular Rate:  103 PR Interval:  156 QRS Duration: 94 QT Interval:  356 QTC Calculation: 466 R Axis:   35 Text Interpretation:  Sinus tachycardia Otherwise normal ECG No old  tracing to compare Confirmed by BELFI  MD, MELANIE ([id number]) on 05/02/2014  12:24:43 AM  ECG:  Date: 05/01/2014  Rate: 103  Rhythm: sinus tachycardia  QRS Axis: normal  Intervals: normal  ST/T Wave abnormalities: normal  Conduction Disutrbances:none  Narrative Interpretation: nonischemic ECG, unchanged from 10/22/13  Old EKG Reviewed: unchanged    10/28/13 Cath: Left ventriculography: Left ventricular systolic function is normal, LVEF is estimated at 55-65%, there is moderate MR post PVC that clears with normal beats.   Final Conclusions:  1. No significant CAD  2. Normal LV function.   MDM   Final diagnoses:  Chest pain, unspecified chest pain type   Baldwin Crown presents with chest pain, sudden onset 6pm tonight without relief from home nitro.  Pt with reported Hx of MI x5 and reports pain tonight is the same as previous.  ECT via EMS without ischemia. No STEMI.    10:01 PM Discussed with  Cardiology (Dr. Adolm Joseph) who is comfortable with 6 hour troponin and d/c home if normal.  Pt is chest pain free at this time.  Pt with normal Cath in 2015 and hx of normal NST.  ECG without ischemia.    12:38 AM Repeat troponin negative.  Chest pain is not likely of cardiac etiology d/t presentation, PERC negative, VSS, no tracheal deviation, no JVD or new murmur, RRR, breath sounds equal bilaterally, EKG without acute abnormalities, negative troponin x2, and negative CXR and recent clean cardiac cath. Pt has been advised to return to the ED if symptoms return, specifically, if CP becomes exertional, associated with diaphoresis or nausea, radiates to left jaw/arm, worsens or becomes concerning in any way. Pt appears reliable for follow up and is agreeable to discharge.   Case has been discussed with Dr. Fredderick Phenix who agrees with the above plan to discharge.    BP 112/71  Pulse 84  Temp(Src) 98.1 F (36.7 C) (Oral)  Resp 16  SpO2 100%   Dierdre Forth, PA-C 05/02/14 0059

## 2014-05-01 NOTE — ED Notes (Signed)
[facility] presents with a 43 yo male from home with chest pain.  Pt was eating dinner and chest pain episode occurred with right arm numbness and bilateral shoulder pain.  Pain was 8 out of 10 and pt took 1 81 mg ASA and 2 NTG with no relief.  [facility] gave 1 NTG and pain reduced to 2 out 10 uypon arrival to this facility.  Hx of CP and cardiac issues.  NSR on monitor.

## 2014-05-01 NOTE — Consult Note (Signed)
CARDIOLOGY CONSULT NOTE  Patient ID: Samuel Williams, MRN: [id number], DOB/AGE: 43-Jan-1972 43 y.o. Admit date: 05/01/2014 Date of Consult: 05/01/2014  Primary Physician: Ailene Ravel, MD Primary Cardiologist: Dr. Delton See  Chief Complaint: chest pain Reason for Consultation: disposition  HPI: 43 y.o. male w/ PMHx significant for prior history of multiple heart attacks with 3 caths, no interventions, HTN though off medications, reports Parkinson disease ([facility] from [facility] tremor) who presented to [facility] on 05/01/2014 with complaints of chest pain. Started at 6, central location that radiates to his right arm.  Occcured after starting to eat dinner, spaghetti. Resolved after 1 hour after being seen by EMS and given SL nitro.  Reviewed recent history in chart and with patient. He states that over that last 10 years, he has had 5 heart attacks, diagnosed in [place] at [place]. Leane Call and [facility]. Has had 2 caths at OSH but no interventions and no blockages. Previously reports being on multiple medications for cholesterol, blood pressure but over time, found that he no longer need BP meds and his  Had stress in 10/2013 at [facility] that was negative for ischemia and then was evaluated by Dr. Delton See who based upon symptoms, warranted a cath to investigate symptoms. Underwent catheterization that demonstrated a 20% lesion in LAD but otherwise, none other. Was discharged back to PCP with the understanding that his symptoms were not due to epicardial dz.  He has never had an EGD but he is to see a Solicitor for ongoing postprandial diarrhea. Also reports having a cholecystectomy for abdominal discomfort though the surgery did not resolve his symptoms. Previously was on antiacids for a prolonged period though he does not think this helped with the pain. These chest pain are rare episodes every couple of months.  FH of father with MI at advanced age. Nonsmoker.  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  Past Medical History  Diagnosis Date  . Restless legs syndrome (RLS)   . Obstructive sleep apnea (adult) (pediatric)   . Pain in limb   . 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 Cough   . 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 Other and unspecified hyperlipidemia   . Backache, unspecified   . Unspecified essential hypertension   . Diarrhea   . Unspecified vitamin D deficiency   . Pain in limb   . Unspecified asthma(493.90)   . Coronary atherosclerosis of unspecified type of vessel, native or graft       Surgical History:  Past Surgical History  Procedure Laterality Date  . Cholecystectomy       Home Meds: Prior to Admission medications   Medication Sig Start Date End Date Taking? Authorizing Provider  aspirin 81 MG tablet Take 81 mg by mouth daily.   Yes Historical Provider, MD  cyclobenzaprine (FLEXERIL) 5 MG tablet Take 5 mg by mouth 3 (three) times daily as needed for muscle spasms.   Yes Historical Provider, MD  EPINEPHrine (EPIPEN 2-PAK) 0.3 mg/0.3 mL SOAJ injection Inject 0.3 mg into the muscle once.    Yes Historical Provider, MD  meloxicam (MOBIC) 15 MG tablet Take 15 mg by mouth daily.   Yes Historical Provider, MD  nitroGLYCERIN (NITROSTAT) 0.4 MG SL tablet Place 0.4 mg under the tongue every 5 (five) minutes as needed for chest pain.   Yes Historical Provider, MD  pravastatin (PRAVACHOL) 40 MG tablet Take 40 mg by mouth daily.   Yes  Historical Provider, MD  primidone (MYSOLINE) 50 MG tablet Take 300 mg by mouth 2 (two) times daily.    Yes Historical Provider, MD    Inpatient Medications:    Allergies:  Allergies  Allergen Reactions  . Bee Venom Anaphylaxis  . Mushroom Extract Complex Anaphylaxis    RAW ONLY    History   Social History  . Marital Status: Single    Spouse Name: N/A    Number of Children: N/A  . Years of Education: N/A   Occupational History   . Not on file.   Social History Main Topics  . Smoking status: Former Games developer  . Smokeless tobacco: Current User    Types: Chew  . Alcohol Use: Yes     Comment: occasional  . Drug Use: No  . Sexual Activity: Not on file   Other Topics Concern  . Not on file   Social History Narrative  . No narrative on file     Family History  Problem Relation Age of Onset  . Hypertension Father   . Heart disease Father   . Hypercholesterolemia Father   . Thyroid disease Father   . Stroke Father   . Cancer Father   . Hypertension Paternal Grandfather   . Heart disease Paternal Grandfather   . Hypercholesterolemia Paternal Grandfather   . Stroke Paternal Grandfather   . Colon cancer Paternal Grandfather   . Diabetes Paternal Grandfather   . Hypertension Sister   . Hypercholesterolemia Sister   . Heart disease Maternal Grandfather   . Diabetes Maternal Grandfather   . Lung cancer Maternal Grandmother   . Diabetes Maternal Grandmother   . Diabetes Paternal Grandmother   . Arthritis    . Brain cancer Mother      Review of Systems: General: negative for chills, fever, night sweats or weight changes.  Cardiovascular: see HPI Dermatological: negative for rash Respiratory: negative for cough or wheezing Urologic: negative for hematuria Abdominal: negative for nausea, vomiting, +diarrhea, bright red blood per rectum, melena, or hematemesis Neurologic: negative for visual changes, syncope, or dizziness All other systems reviewed and are otherwise negative except as noted above.  Labs:  Recent Labs  05/01/14 2035  TROPONINI <0.30   Lab Results  Component Value Date   WBC 9.7 05/01/2014   HGB 15.1 05/01/2014   HCT 43.3 05/01/2014   MCV 93.7 05/01/2014   PLT 267 05/01/2014    Recent Labs Lab 05/01/14 2035  NA 143  K 3.5*  CL 105  CO2 23  BUN 8  CREATININE 0.79  CALCIUM 8.8  PROT 6.5  BILITOT 0.5  ALKPHOS 71  ALT 45  AST 45*  GLUCOSE 90   No results found for this  basename: CHOL, HDL, LDLCALC, TRIG    Radiology/Studies:  Dg Chest 2 View  05/01/2014   CLINICAL DATA:  Chest pain  EXAM: CHEST  2 VIEW  COMPARISON:  09/17/2013  FINDINGS: The heart size and mediastinal contours are within normal limits. Both lungs are clear. The visualized skeletal structures are unremarkable.  IMPRESSION: No active cardiopulmonary disease.   Electronically Signed   By: Amie Portland M.D.   On: 05/01/2014 20:56    EKG: sinus, normal  Physical Exam: Blood pressure 138/87, pulse 86, temperature 98.1 F (36.7 C), temperature source Oral, resp. rate 18, SpO2 98.00%. General: Well developed, well nourished, in no acute distress. Obese, multiple tattoos Head: Normocephalic, atraumatic, sclera non-icteric, no xanthomas, nares are without discharge.  Neck: Supple. Negative for carotid  bruits. JVD not elevated. Lungs: Clear bilaterally to auscultation without wheezes, rales, or rhonchi. Breathing is unlabored. Heart: RRR with S1 S2. No murmurs, rubs, or gallops appreciated. Abdomen: Soft, non-tender, non-distended with normoactive bowel sounds. No hepatomegaly. No rebound/guarding. No obvious abdominal masses. Msk:  Strength and tone appear normal for age. Extremities: No clubbing or cyanosis. No edema.  Distal pedal pulses are 2+ and equal bilaterally. Neuro: Alert and oriented X 3. Moves all extremities spontaneously. Psych:  Responds to questions appropriately with a normal affect.     Assessment and Plan:  43 y.o. male w/ PMHx significant for prior history of multiple heart attacks with 3 caths, no interventions, HTN though off medications, reports Parkinson disease ([facility] from [facility] tremor) who presented to [facility] on 05/01/2014 with complaints of chest pain.  Encouragingly, has had negative troponin initially and a completely normal EKG. His Heart score (ignoring the questionable history of multiple heart attacks) is low. Furthermore, had similar symptoms  in March of this year and had both a negative stress and no signficant epicardial disease noted on cath. I am not sure what to make of this history of multiple MIs previously (do not have the records) but it is atypical to have had 5 heart attacks and 3 negative caths. Does not fit the picture of Prinzmetals (normal EKG, not male, no diabetic).  Also interesting constellation of tremor (he says Parkinsons but [facility]where says tremor) and post-prandial diarrhea. The GI symptoms suggest that perhaps he has some GI etiology of the symptoms and his upcoming visit to a gastroenterologist will be helpful.  In summary, he is rather low risk given nl EKG, minimal risk factors, and a negative workup for epicardial disease 6 months ago. If follow up 7 hr post troponin is negative, he can be safely discharged home. Discussed indications to seek emergent care again and both him and his wife verbalized understanding. He is welcome to followup with Dr. Delton See for cardiac needs but hopefully, the GI consult will be clarifying.    Signed, Adolm Joseph, Nayvie Lips C. MD 05/01/2014, 11:22 PM

## 2014-05-02 LAB — TROPONIN I

## 2014-05-02 LAB — I-STAT TROPONIN, ED: Troponin i, poc: 0 ng/mL (ref 0.00–0.08)

## 2014-05-02 NOTE — ED Provider Notes (Signed)
Medical screening examination/treatment/procedure(s) were performed by non-physician practitioner and as supervising physician I was immediately available for consultation/collaboration.   EKG Interpretation   Date/Time:  Friday May 01 2014 19:37:26 EDT Ventricular Rate:  103 PR Interval:  156 QRS Duration: 94 QT Interval:  356 QTC Calculation: 466 R Axis:   35 Text Interpretation:  Sinus tachycardia Otherwise normal ECG No old  tracing to compare Confirmed by Lequita Meadowcroft  MD, Nazeer Romney ([id number]) on 05/02/2014  12:24:43 AM        Rolan Bucco, MD 05/02/14 1503

## 2014-05-02 NOTE — Discharge Instructions (Signed)
1. Medications: usual home medications 2. Treatment: rest, drink plenty of fluids,  3. Follow Up: Please followup with your primary doctor and your cardiologist for discussion of your diagnoses and further evaluation after today's visit;      Chest Pain (Nonspecific) It is often hard to give a specific diagnosis for the cause of chest pain. There is always a chance that your pain could be related to something serious, such as a heart attack or a blood clot in the lungs. You need to follow up with your health care provider for further evaluation. CAUSES   Heartburn.  Pneumonia or bronchitis.  Anxiety or stress.  Inflammation around your heart (pericarditis) or lung (pleuritis or pleurisy).  A blood clot in the lung.  A collapsed lung (pneumothorax). It can develop suddenly on its own (spontaneous pneumothorax) or from trauma to the chest.  Shingles infection (herpes zoster virus). The chest wall is composed of bones, muscles, and cartilage. Any of these can be the source of the pain.  The bones can be bruised by injury.  The muscles or cartilage can be strained by coughing or overwork.  The cartilage can be affected by inflammation and become sore (costochondritis). DIAGNOSIS  Lab tests or other studies may be needed to find the cause of your pain. Your health care provider may have you take a test called an ambulatory electrocardiogram (ECG). An ECG records your heartbeat patterns over a 24-hour period. You may also have other tests, such as:  Transthoracic echocardiogram (TTE). During echocardiography, sound waves are used to evaluate how blood flows through your heart.  Transesophageal echocardiogram (TEE).  Cardiac monitoring. This allows your health care provider to monitor your heart rate and rhythm in real time.  Holter monitor. This is a portable device that records your heartbeat and can help diagnose heart arrhythmias. It allows your health care provider to track your  heart activity for several days, if needed.  Stress tests by exercise or by giving medicine that makes the heart beat faster. TREATMENT   Treatment depends on what may be causing your chest pain. Treatment may include:  Acid blockers for heartburn.  Anti-inflammatory medicine.  Pain medicine for inflammatory conditions.  Antibiotics if an infection is present.  You may be advised to change lifestyle habits. This includes stopping smoking and avoiding alcohol, caffeine, and chocolate.  You may be advised to keep your head raised (elevated) when sleeping. This reduces the chance of acid going backward from your stomach into your esophagus. Most of the time, nonspecific chest pain will improve within 2-3 days with rest and mild pain medicine.  HOME CARE INSTRUCTIONS   If antibiotics were prescribed, take them as directed. Finish them even if you start to feel better.  For the next few days, avoid physical activities that bring on chest pain. Continue physical activities as directed.  Do not use any tobacco products, including cigarettes, chewing tobacco, or electronic cigarettes.  Avoid drinking alcohol.  Only take medicine as directed by your health care provider.  Follow your health care provider's suggestions for further testing if your chest pain does not go away.  Keep any follow-up appointments you made. If you do not go to an appointment, you could develop lasting (chronic) problems with pain. If there is any problem keeping an appointment, call to reschedule. SEEK MEDICAL CARE IF:   Your chest pain does not go away, even after treatment.  You have a rash with blisters on your chest.  You have  a fever. SEEK IMMEDIATE MEDICAL CARE IF:   You have increased chest pain or pain that spreads to your arm, neck, jaw, back, or abdomen.  You have shortness of breath.  You have an increasing cough, or you cough up blood.  You have severe back or abdominal pain.  You feel  nauseous or vomit.  You have severe weakness.  You faint.  You have chills. This is an emergency. Do not wait to see if the pain will go away. Get medical help at once. Call your local emergency services (911 in [place] Do not drive yourself to the hospital. MAKE SURE YOU:   Understand these instructions.  Will watch your condition.  Will get help right away if you are not doing well or get worse. Document Released: 05/10/2005 Document Revised: 08/05/2013 Document Reviewed: 03/05/2008 [facility] Patient Information 2015 [place], [place]. This information is not intended to replace advice given to you by your health care provider. Make sure you discuss any questions you have with your health care provider.

## 2014-07-18 ENCOUNTER — Encounter (HOSPITAL_COMMUNITY): Payer: Self-pay | Admitting: Nurse Practitioner

## 2014-07-18 ENCOUNTER — Inpatient Hospital Stay (HOSPITAL_COMMUNITY)
Admission: EM | Admit: 2014-07-18 | Discharge: 2014-07-20 | DRG: 918 | Disposition: A | Discharge status: Home / Self Care | Payer: Medicaid Other | Attending: Internal Medicine | Admitting: Internal Medicine

## 2014-07-18 ENCOUNTER — Emergency Department (HOSPITAL_COMMUNITY): Payer: Medicaid Other

## 2014-07-18 DIAGNOSIS — Z91030 Bee allergy status: Secondary | ICD-10-CM

## 2014-07-18 DIAGNOSIS — E785 Hyperlipidemia, unspecified: Secondary | ICD-10-CM | POA: Diagnosis present

## 2014-07-18 DIAGNOSIS — F1010 Alcohol abuse, uncomplicated: Secondary | ICD-10-CM | POA: Diagnosis present

## 2014-07-18 DIAGNOSIS — G20 Parkinson's disease: Secondary | ICD-10-CM | POA: Diagnosis present

## 2014-07-18 DIAGNOSIS — G20A1 Parkinson's disease without dyskinesia, without mention of fluctuations: Secondary | ICD-10-CM | POA: Diagnosis present

## 2014-07-18 DIAGNOSIS — Z8249 Family history of ischemic heart disease and other diseases of the circulatory system: Secondary | ICD-10-CM

## 2014-07-18 DIAGNOSIS — G2581 Restless legs syndrome: Secondary | ICD-10-CM | POA: Diagnosis present

## 2014-07-18 DIAGNOSIS — R079 Chest pain, unspecified: Secondary | ICD-10-CM | POA: Diagnosis present

## 2014-07-18 DIAGNOSIS — R55 Syncope and collapse: Secondary | ICD-10-CM | POA: Diagnosis present

## 2014-07-18 DIAGNOSIS — G4733 Obstructive sleep apnea (adult) (pediatric): Secondary | ICD-10-CM | POA: Diagnosis present

## 2014-07-18 DIAGNOSIS — I2510 Atherosclerotic heart disease of native coronary artery without angina pectoris: Secondary | ICD-10-CM | POA: Diagnosis present

## 2014-07-18 DIAGNOSIS — Z955 Presence of coronary angioplasty implant and graft: Secondary | ICD-10-CM

## 2014-07-18 DIAGNOSIS — Z7982 Long term (current) use of aspirin: Secondary | ICD-10-CM

## 2014-07-18 DIAGNOSIS — T426X5A Adverse effect of other antiepileptic and sedative-hypnotic drugs, initial encounter: Secondary | ICD-10-CM | POA: Diagnosis present

## 2014-07-18 DIAGNOSIS — S0990XA Unspecified injury of head, initial encounter: Secondary | ICD-10-CM

## 2014-07-18 DIAGNOSIS — I10 Essential (primary) hypertension: Secondary | ICD-10-CM | POA: Diagnosis present

## 2014-07-18 DIAGNOSIS — F17220 Nicotine dependence, chewing tobacco, uncomplicated: Secondary | ICD-10-CM | POA: Diagnosis present

## 2014-07-18 DIAGNOSIS — Z833 Family history of diabetes mellitus: Secondary | ICD-10-CM

## 2014-07-18 DIAGNOSIS — T426X1A Poisoning by other antiepileptic and sedative-hypnotic drugs, accidental (unintentional), initial encounter: Principal | ICD-10-CM | POA: Diagnosis present

## 2014-07-18 DIAGNOSIS — I252 Old myocardial infarction: Secondary | ICD-10-CM

## 2014-07-18 DIAGNOSIS — E119 Type 2 diabetes mellitus without complications: Secondary | ICD-10-CM | POA: Diagnosis present

## 2014-07-18 HISTORY — DX: Hyperlipidemia, unspecified: E78.5

## 2014-07-18 HISTORY — DX: Parkinson's disease: G20

## 2014-07-18 HISTORY — DX: Type 2 diabetes mellitus without complications: E11.9

## 2014-07-18 HISTORY — DX: Parkinson's disease without dyskinesia, without mention of fluctuations: G20.A1

## 2014-07-18 HISTORY — DX: Acute myocardial infarction, unspecified: I21.9

## 2014-07-18 LAB — COMPREHENSIVE METABOLIC PANEL
ALT: 26 U/L (ref 0–53)
AST: 24 U/L (ref 0–37)
Albumin: 4 g/dL (ref 3.5–5.2)
Alkaline Phosphatase: 72 U/L (ref 39–117)
Anion gap: 14 (ref 5–15)
BUN: 11 mg/dL (ref 6–23)
CALCIUM: 9.1 mg/dL (ref 8.4–10.5)
CO2: 26 meq/L (ref 19–32)
Chloride: 105 mEq/L (ref 96–112)
Creatinine, Ser: 0.83 mg/dL (ref 0.50–1.35)
GFR calc Af Amer: >90 mL/min (ref >90)
GLUCOSE: 91 mg/dL (ref 70–99)
Potassium: 4.1 mEq/L (ref 3.7–5.3)
SODIUM: 145 meq/L (ref 137–147)
Total Bilirubin: 0.4 mg/dL (ref 0.3–1.2)
Total Protein: 7.1 g/dL (ref 6.0–8.3)

## 2014-07-18 LAB — RAPID URINE DRUG SCREEN, HOSP PERFORMED
Amphetamines: NOT DETECTED
BARBITURATES: NOT DETECTED
Benzodiazepines: NOT DETECTED
Cocaine: NOT DETECTED
Opiates: NOT DETECTED
Tetrahydrocannabinol: NOT DETECTED

## 2014-07-18 LAB — URINALYSIS, ROUTINE W REFLEX MICROSCOPIC
BILIRUBIN URINE: NEGATIVE
GLUCOSE, UA: NEGATIVE mg/dL
HGB URINE DIPSTICK: NEGATIVE
Ketones, ur: NEGATIVE mg/dL
Leukocytes, UA: NEGATIVE
Nitrite: NEGATIVE
PROTEIN: NEGATIVE mg/dL
Specific Gravity, Urine: 1.006 (ref 1.005–1.030)
Urobilinogen, UA: 0.2 mg/dL (ref 0.0–1.0)
pH: 5.5 (ref 5.0–8.0)

## 2014-07-18 LAB — CBC
HCT: 45.4 % (ref 39.0–52.0)
Hemoglobin: 15.8 g/dL (ref 13.0–17.0)
MCH: 32.3 pg (ref 26.0–34.0)
MCHC: 34.8 g/dL (ref 30.0–36.0)
MCV: 92.8 fL (ref 78.0–100.0)
PLATELETS: 255 10*3/uL (ref 150–400)
RBC: 4.89 MIL/uL (ref 4.22–5.81)
RDW: 12.9 % (ref 11.5–15.5)
WBC: 11.5 10*3/uL — ABNORMAL HIGH (ref 4.0–10.5)

## 2014-07-18 LAB — TROPONIN I
Troponin I: <0.3 ng/mL (ref <0.30)
Troponin I: <0.3 ng/mL (ref <0.30)

## 2014-07-18 LAB — PRO B NATRIURETIC PEPTIDE: Pro B Natriuretic peptide (BNP): 43.5 pg/mL (ref 0–125)

## 2014-07-18 IMAGING — CR DG CHEST 1V PORT
1 series · 1 of 1 positions shown · non-contrast
Comparison: [DATE]

CLINICAL DATA: Shortness of breath, chest pain, nausea x2 days

EXAM:
PORTABLE CHEST - 1 VIEW

[AP]
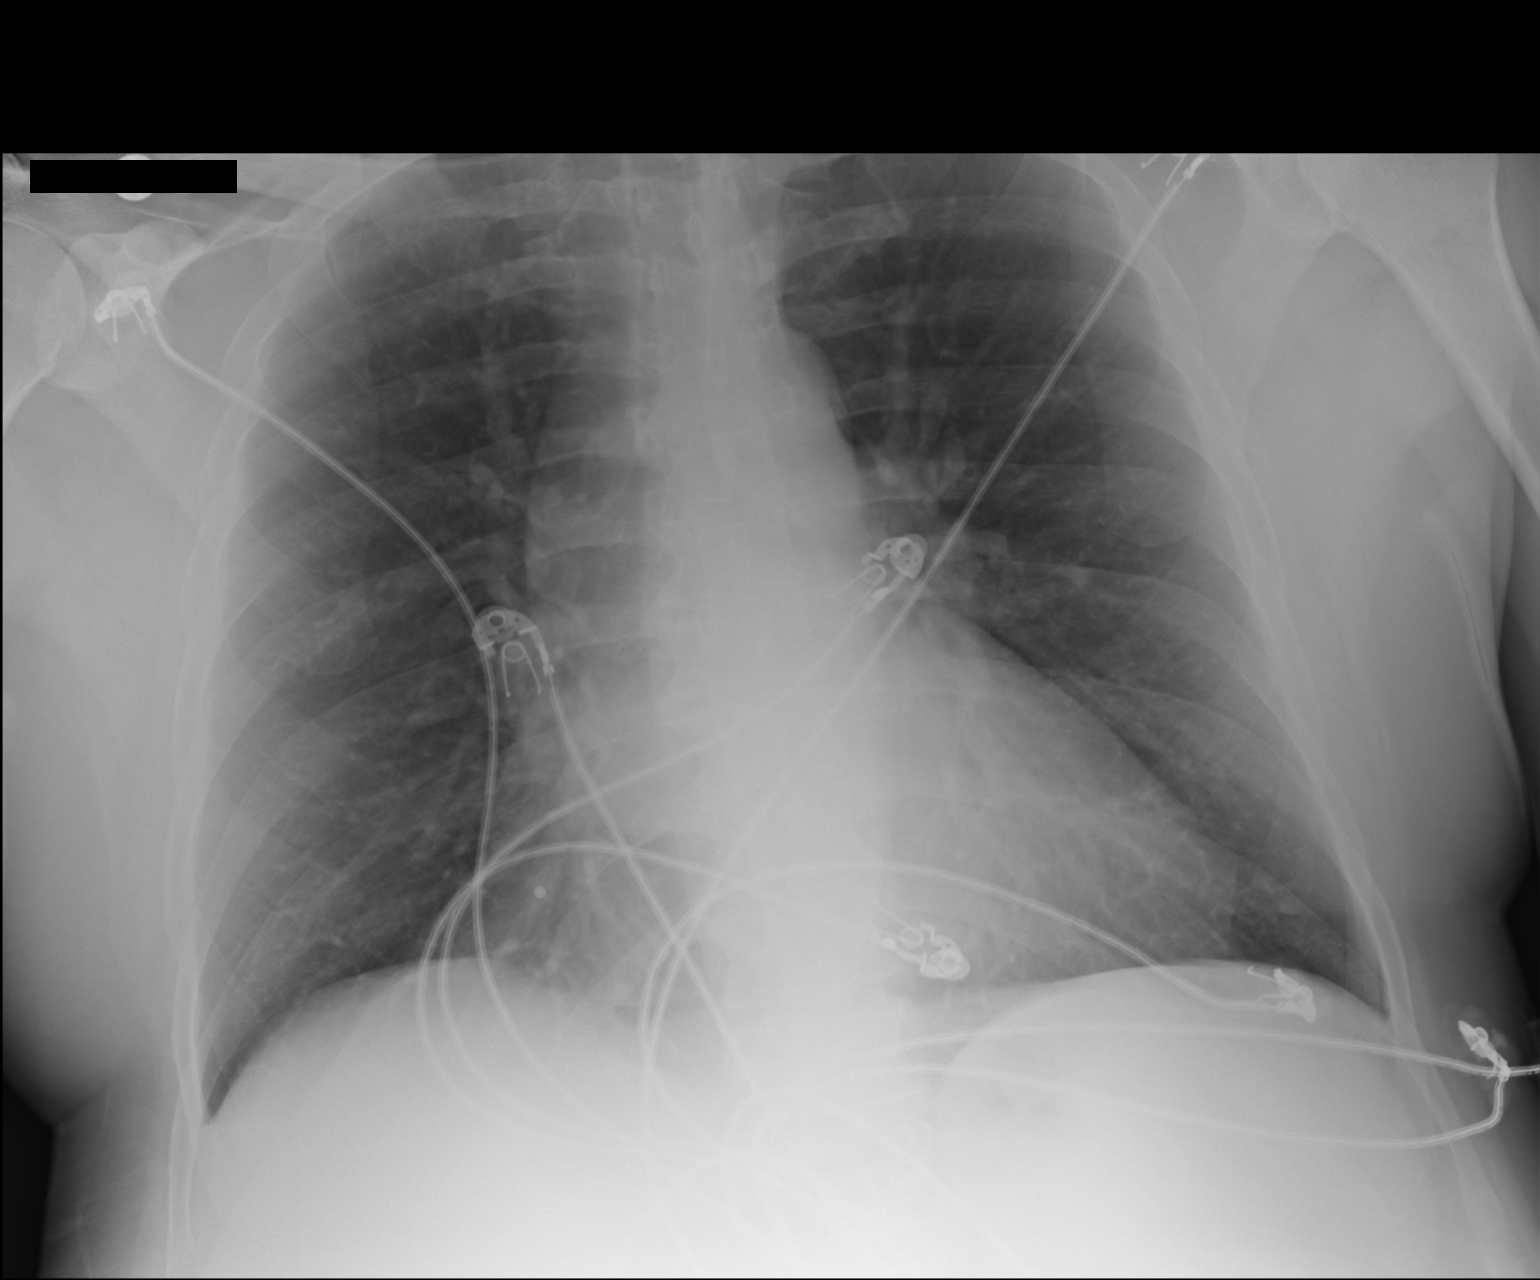

[1 of 1 positions shown; findings below may reference images not displayed]

FINDINGS: Lungs are clear.  No pleural effusion or pneumothorax.

Cardiomegaly.
IMPRESSION: No evidence of acute cardiopulmonary disease.

## 2014-07-18 MED ORDER — NITROGLYCERIN 2 % TD OINT
1.0000 [in_us] | TOPICAL_OINTMENT | Freq: Once | TRANSDERMAL | Status: AC
Start: 2014-07-18 — End: 2014-07-18
  Administered 2014-07-18: 1 [in_us] via TOPICAL
  Filled 2014-07-18: qty 1

## 2014-07-18 NOTE — ED Provider Notes (Signed)
Pt with hx of CP X 3 days - had syncope events X 2 back to back today - this was ! 30 minutes on the ground unconscious according to son who witnessed it - denies ETOH or drug abuse.  On exam has normal ECG and normal heart and lung sounds, neuro normal  Needs admission for cardiac monitoring.  Medical screening examination/treatment/procedure(s) were conducted as a shared visit with non-physician practitioner(s) and myself.  I personally evaluated the patient during the encounter.  Clinical Impression:   Final diagnoses:  Syncope and collapse  Chest pain, unspecified chest pain type         Vida RollerBrian D Kadeja Granada, MD 07/18/14 [phone]

## 2014-07-18 NOTE — ED Notes (Signed)
Per EMS pt was in the woods and walking back to house and sts passed out x2 while walking back. Pt endorses heaviness & crushing pain 9/10 chest pain at 4:45pm. Pt took 1 nitro at home pain 7/10. Pt endorses moderate nausea and ShOB. Pt has had 324 ASA 250mL of LR and 1nitro- pt now 1/10  EKG- initially ST rate 102 and NSR on arrival

## 2014-07-18 NOTE — H&P (Signed)
PCP:  Ailene RavelHAMRICK,MAURA L, MD  [facility]  Chief Complaint:  syncope  HPI: [name] is a 43 y.o. 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   Presented with  Patient has been out in the woods building a "man-cave" with his teenage son. He has been drinking some reports about 5-6 beer bottles and some mountain dew. Patient was sitting by the fire he stood up and passed out. Hit the floor he stood up and fainted again. He started to walk towards the house and fainted again. For the total of 3 times. Each episode would last for almost 10 min. Son checked his pulse and noted that patient was breathing. Reports no seizure activity, no myoclonic jerks. Son reports that patient hit his head. Patient reports that this was associated with sensation of train pounding him in his chest. Currently he is chest pain free.  Of note last Cardiac cath from March 2015 showed no CAD.  In ER troponin wnl, CXR wnl  Hospitalist was called for admission for Syncope  Review of Systems:    Pertinent positives include:  syncope palpitations. He reports for a about a week his feet been feeling ice cold  Constitutional:  No weight loss, night sweats, Fevers, chills, fatigue, weight loss  HEENT:  No headaches, Difficulty swallowing,Tooth/dental problems,Sore throat,  No sneezing, itching, ear ache, nasal congestion, post nasal drip,  Cardio-vascular:  No  chest pain, Orthopnea, PND, anasarca, dizziness,no Bilateral lower extremity swelling  GI:  No heartburn, indigestion, abdominal pain, nausea, vomiting, diarrhea, change in bowel habits, loss of appetite, melena, blood in stool, hematemesis Resp:  no shortness of breath at rest. No dyspnea on exertion, No excess mucus, no productive cough, No non-productive cough, No coughing up of blood.No change in color of mucus.No wheezing. Skin:  no rash or lesions. No jaundice GU:  no dysuria, change in color of urine, no urgency or frequency. No straining to urinate.  No flank pain.  Musculoskeletal:  No joint pain or no joint swelling. No decreased range of motion. No back pain.  Psych:  No change in mood or affect. No depression or anxiety. No memory loss.  Neuro: no localizing neurological complaints, no tingling, no weakness, no double vision, no gait abnormality, no slurred speech, no confusion  Otherwise ROS are negative except for above, 10 systems were reviewed  Past Medical History: Past Medical History  Diagnosis Date  . Restless legs syndrome (RLS)   . Obstructive sleep apnea (adult) (pediatric)   . Pain in limb   . 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 Cough   . 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 Other and unspecified hyperlipidemia   . Backache, unspecified   . Unspecified essential hypertension   . Diarrhea   . Unspecified vitamin D deficiency   . Pain in limb   . Unspecified asthma(493.90)   . Coronary atherosclerosis of unspecified type of vessel, native or graft   . MI (myocardial infarction) x5  . Diabetes mellitus without complication   . Parkinson disease  Past Surgical History  Procedure Laterality Date  . Cholecystectomy    . Coronary stent placement       Medications: Prior to Admission medications   Medication Sig Start Date End Date Taking? Authorizing Provider  aspirin 81 MG tablet Take 81 mg by mouth  daily.   Yes Historical Provider, MD  cyclobenzaprine (FLEXERIL) 5 MG tablet Take 5 mg by mouth 3 (three) times daily as needed for muscle spasms.   Yes Historical Provider, MD  EPINEPHrine (EPIPEN 2-PAK) 0.3 mg/0.3 mL SOAJ injection Inject 0.3 mg into the muscle as needed (anaphylaxis).    Yes Historical Provider, MD  nitroGLYCERIN (NITROSTAT) 0.4 MG SL tablet Place 0.4 mg under the tongue every 5 (five) minutes as needed for chest pain.   Yes Historical Provider, MD  pravastatin (PRAVACHOL) 40 MG tablet Take 40 mg by mouth daily.   Yes Historical Provider, MD  primidone (MYSOLINE) 50 MG tablet Take 300 mg by mouth 2 (two) times daily.    Yes Historical Provider, MD    Allergies:   Allergies  Allergen Reactions  . Bee Venom Anaphylaxis  . Mushroom Extract Complex Anaphylaxis    RAW ONLY    Social History:  Ambulatory   Independently  Lives at home  With family     reports that he has quit smoking. His smokeless tobacco use includes Chew. He reports that he drinks alcohol. He reports that he does not use illicit drugs.    Family History: family history includes Arthritis in an other family member; Brain cancer in his mother; Cancer in his father; Colon cancer in his paternal grandfather; Diabetes in his maternal grandfather, maternal grandmother, paternal grandfather, and paternal grandmother; Heart disease in his father, maternal grandfather, and paternal grandfather; Hypercholesterolemia in his father, paternal grandfather, and sister; Hypertension in his father, paternal grandfather, and sister; Lung cancer in his maternal grandmother; Stroke in his father and paternal grandfather; Thyroid disease in his father.    Physical Exam: Patient Vitals for the past 24 hrs:  BP Temp Temp src Pulse Resp SpO2 Height Weight  07/18/14 2145 116/62 mmHg - - 94 18 95 % - -  07/18/14 2130 112/60 mmHg - - 91 17 96 % - -  07/18/14 2115 117/70 mmHg - - 87 18 96 % - -  07/18/14 2100 114/76 mmHg - -  89 21 97 % - -  07/18/14 2045 128/77 mmHg - - 87 18 98 % - -  07/18/14 2030 140/83 mmHg - - 93 23 99 % - -  07/18/14 2015 126/81 mmHg - - 89 19 98 % - -  07/18/14 2000 123/85 mmHg - - 88 19 97 % - -  07/18/14 1945 132/82 mmHg - - 86 12 98 % - -  07/18/14 1934 - - - - - - 5\' 6"  (1.676 m) 87.091 kg (192 lb)  07/18/14 1930 134/79 mmHg - - 86 18 97 % - -  07/18/14 1923 137/82 mmHg 98.7 F (37.1 C) Oral 95 18 100 % - -  07/18/14 1918 - - - 96 20 100 % - -  07/18/14 1912 - - - - - 98 % - -    1. General:  in No Acute distress disheveled 2. Psychological: Alert and Oriented 3. Head/ENT:   Moist  Mucous Membranes                          Head Non traumatic, neck supple  Poor Dentition 4. SKIN:   decreased Skin turgor,  Skin clean Dry and intact no rash 5. Heart: Regular rate and rhythm no Murmur, Rub or gallop 6. Lungs: no wheezes occasional crackles   7. Abdomen: Soft, non-tender, Non distended 8. Lower extremities: no clubbing, cyanosis, or edema 9. Neurologically strength 5 out of 5 in all 4 extremities cranial nerves intact 10. MSK: Normal range of motion  body mass index is 31 kg/(m^2).   Labs on Admission:   Results for orders placed or performed during the hospital encounter of 07/18/14 (from the past 24 hour(s))  CBC     Status: Abnormal   Collection Time: 07/18/14  7:29 PM  Result Value Ref Range   WBC 11.5 (H) 4.0 - 10.5 K/uL   RBC 4.89 4.22 - 5.81 MIL/uL   Hemoglobin 15.8 13.0 - 17.0 g/dL   HCT 96.0 45.4 - 09.8 %   MCV 92.8 78.0 - 100.0 fL   MCH 32.3 26.0 - 34.0 pg   MCHC 34.8 30.0 - 36.0 g/dL   RDW 11.9 14.7 - 82.9 %   Platelets 255 150 - 400 K/uL  Troponin I (order at [facility])     Status: None   Collection Time: 07/18/14  7:29 PM  Result Value Ref Range   Troponin I <0.30 <0.30 ng/mL  Comprehensive metabolic panel     Status: None   Collection Time: 07/18/14  7:29 PM  Result Value Ref Range   Sodium 145 137 - 147 mEq/L   Potassium 4.1 3.7  - 5.3 mEq/L   Chloride 105 96 - 112 mEq/L   CO2 26 19 - 32 mEq/L   Glucose, Bld 91 70 - 99 mg/dL   BUN 11 6 - 23 mg/dL   Creatinine, Ser 5.62 0.50 - 1.35 mg/dL   Calcium 9.1 8.4 - 13.0 mg/dL   Total Protein 7.1 6.0 - 8.3 g/dL   Albumin 4.0 3.5 - 5.2 g/dL   AST 24 0 - 37 U/L   ALT 26 0 - 53 U/L   Alkaline Phosphatase 72 39 - 117 U/L   Total Bilirubin 0.4 0.3 - 1.2 mg/dL   GFR calc non Af Amer >90 >90 mL/min   GFR calc Af Amer >90 >90 mL/min   Anion gap 14 5 - 15  Pro b natriuretic peptide (order if patient c/o SOB ONLY)     Status: None   Collection Time: 07/18/14  7:33 PM  Result Value Ref Range   Pro B Natriuretic peptide (BNP) 43.5 0 - 125 pg/mL  Urinalysis, Routine w reflex microscopic     Status: None   Collection Time: 07/18/14  7:45 PM  Result Value Ref Range   Color, Urine YELLOW YELLOW   APPearance CLEAR CLEAR   Specific Gravity, Urine 1.006 1.005 - 1.030   pH 5.5 5.0 - 8.0   Glucose, UA NEGATIVE NEGATIVE mg/dL   Hgb urine dipstick NEGATIVE NEGATIVE   Bilirubin Urine NEGATIVE NEGATIVE   Ketones, ur NEGATIVE NEGATIVE mg/dL   Protein, ur NEGATIVE NEGATIVE mg/dL   Urobilinogen, UA 0.2 0.0 - 1.0 mg/dL   Nitrite NEGATIVE NEGATIVE   Leukocytes, UA NEGATIVE NEGATIVE  Urine rapid drug screen (hosp performed)     Status: None   Collection Time: 07/18/14  8:40 PM  Result Value Ref Range   Opiates NONE DETECTED NONE DETECTED   Cocaine NONE DETECTED NONE DETECTED   Benzodiazepines NONE DETECTED NONE DETECTED   Amphetamines NONE DETECTED NONE DETECTED   Tetrahydrocannabinol  NONE DETECTED NONE DETECTED   Barbiturates NONE DETECTED NONE DETECTED    UA no evidence of UTI  No results found for: HGBA1C  Estimated Creatinine Clearance: 118.7 mL/min (by C-G formula based on Cr of 0.83).  BNP (last 3 results)  Recent Labs  07/18/14 1933  PROBNP 43.5    Other results:  I have pearsonaly reviewed this: ECG REPORT  Rate: 93  Rhythm: Sinus rhythm ST&T Change: No acute  ischemic changes   Filed Weights   07/18/14 1934  Weight: 87.091 kg (192 lb)     Cultures: No results found for: SDES, SPECREQUEST, CULT, REPTSTATUS   Radiological Exams on Admission: Dg Chest Portable 1 View  07/18/2014   CLINICAL DATA:  Shortness of breath, chest pain, nausea x2 days  EXAM: PORTABLE CHEST - 1 VIEW  COMPARISON:  05/01/2014  FINDINGS: Lungs are clear.  No pleural effusion or pneumothorax.  Cardiomegaly.  IMPRESSION: No evidence of acute cardiopulmonary disease.   Electronically Signed   By: Charline Bills M.D.   On: 07/18/2014 20:28    Chart has been reviewed  Assessment/Plan  44 year old male with history of essential hypertension hyperlipidemia and self-reported coronary artery disease with a catheterization done at [facility] in March 2015 showing preserved EF and no evidence of coronary artery disease presents with chest pain and syncope possibly orthostatic.  Present on Admission:  . Syncope and collapse - in a setting of alcohol use, and caffeinated beverages syncope upon standing not. Last cardiac catheterization was done in March was unremarkable  . Essential hypertension - continue home medications currently stable  . Hyperlipidemia continue statin  . Chest pain - somewhat atypical but will cycle cardiac enzymes make nothing by mouth for possible stress test obtain echogram check lipid panel and TSH chest pain nonpleuritic currently resolved the fact that patient had had negative coronary artery catheterization within this year is reassuring  . Parkinson disease - continue home medications this could contribute to vital signs and stability Patient reports history of hitting his head of obtain CT of the head  Prophylaxis: Lovenox if CT head negative  , Protonix  CODE STATUS:  FULL CODE   Other plan as per orders.  I have spent a total of 55 min on this admission  Aldeen Riga 07/18/2014, 9:58 PM  [facility]  Pager [phone]   after 2  AM please page floor coverage PA If 7AM-7PM, please contact the day team taking care of the patient  [email or web address]  Password [email or web address]

## 2014-07-18 NOTE — Progress Notes (Signed)
Patient arrived to floor via stretcher awake and oriented x 4. Denies c/o pain/dsicomfort at present time. Maintained on telemetry and is NSR, with a heart rate of 87. Patient oriented to room and call bell system. Placed on high fall risk protocol, and encouraged to call for assistance when ambulating to the bathroom secondary to recent fall. Will continue to monitor.  Sharlene Doryanya Branston Halsted, RN

## 2014-07-18 NOTE — ED Provider Notes (Signed)
CSN: [id number]     Arrival date & time 07/18/14  1859 History   First MD Initiated Contact with Patient 07/18/14 1901     Chief Complaint  Patient presents with  . Chest Pain     (Consider location/radiation/quality/duration/timing/severity/associated sxs/prior Treatment) HPI Comments: Patient is a 43 year old male past medical history significant for DM, hypertension, hyperlipidemia, history of MI with 3 coronary stent placements (last 2 years ago) presenting to the emergency department for central chest heaviness with radiation to bilateral shoulders that began Thursday and has been getting progressively worse. Patient states he developed shortness of breath, nausea, vomiting today. He had 2 syncopal episodes due to increased pain today. His pain has improved to a 2 out of 10 after sublingual nitroglycerin and 324 mg of aspirin given via EMS. His last echocardiogram was 6 months ago. He also underwent a stress test at that time but was unable to complete due to syncope.  Patient is a 43 y.o. male presenting with chest pain.  Chest Pain Associated symptoms: diaphoresis, nausea, shortness of breath and vomiting     Past Medical History  Diagnosis Date  . Restless legs syndrome (RLS)   . Obstructive sleep apnea (adult) (pediatric)   . Pain in limb   . 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 Cough   . 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 Other and unspecified hyperlipidemia   . Backache, unspecified   . Unspecified essential hypertension   . Diarrhea   . Unspecified vitamin D deficiency   . Pain in limb   . Unspecified asthma(493.90)   . Coronary atherosclerosis of unspecified type of vessel, native or graft   . MI (myocardial infarction) x5  . Diabetes mellitus without complication   . Parkinson disease    Past Surgical History  Procedure Laterality Date  . Cholecystectomy    . Coronary stent placement     Family History  Problem  Relation Age of Onset  . Hypertension Father   . Heart disease Father   . Hypercholesterolemia Father   . Thyroid disease Father   . Stroke Father   . Cancer Father   . Hypertension Paternal Grandfather   . Heart disease Paternal Grandfather   . Hypercholesterolemia Paternal Grandfather   . Stroke Paternal Grandfather   . Colon cancer Paternal Grandfather   . Diabetes Paternal Grandfather   . Hypertension Sister   . Hypercholesterolemia Sister   . Heart disease Maternal Grandfather   . Diabetes Maternal Grandfather   . Lung cancer Maternal Grandmother   . Diabetes Maternal Grandmother   . Diabetes Paternal Grandmother   . Arthritis    . Brain cancer Mother    History  Substance Use Topics  . Smoking status: Former Games developermoker  . Smokeless tobacco: Current User    Types: Chew  . Alcohol Use: Yes     Comment: occasional    Review of Systems  Constitutional: Positive for diaphoresis.  Respiratory: Positive for shortness of breath.   Cardiovascular: Positive for chest pain.  Gastrointestinal: Positive for nausea and vomiting.  All other systems reviewed and are negative.     Allergies  Bee venom and Mushroom extract complex  Home Medications   Prior to Admission medications   Medication Sig Start Date End Date Taking? Authorizing Provider  aspirin 81 MG tablet Take 81 mg by mouth daily.   Yes Historical Provider, MD  cyclobenzaprine (FLEXERIL) 5 MG tablet Take 5 mg by mouth  3 (three) times daily as needed for muscle spasms.   Yes Historical Provider, MD  EPINEPHrine (EPIPEN 2-PAK) 0.3 mg/0.3 mL SOAJ injection Inject 0.3 mg into the muscle as needed (anaphylaxis).    Yes Historical Provider, MD  nitroGLYCERIN (NITROSTAT) 0.4 MG SL tablet Place 0.4 mg under the tongue every 5 (five) minutes as needed for chest pain.   Yes Historical Provider, MD  pravastatin (PRAVACHOL) 40 MG tablet Take 40 mg by mouth daily.   Yes Historical Provider, MD  primidone (MYSOLINE) 50 MG tablet  Take 300 mg by mouth 2 (two) times daily.    Yes Historical Provider, MD   BP 115/86 mmHg  Pulse 87  Temp(Src) 98.7 F (37.1 C) (Oral)  Resp 20  Ht 5\' 6"  (1.676 m)  Wt 192 lb (87.091 kg)  BMI 31.00 kg/m2  SpO2 99% Physical Exam  Constitutional: He is oriented to person, place, and time. He appears well-developed and well-nourished. No distress.  HENT:  Head: Normocephalic and atraumatic.  Right Ear: External ear normal.  Left Ear: External ear normal.  Nose: Nose normal.  Mouth/Throat: Oropharynx is clear and moist.  Eyes: Conjunctivae are normal.  Neck: Normal range of motion. Neck supple.  Cardiovascular: Normal rate, regular rhythm, normal heart sounds and intact distal pulses.   Pulmonary/Chest: Effort normal and breath sounds normal. No respiratory distress.  Abdominal: Soft. There is no tenderness.  Musculoskeletal: Normal range of motion.  Neurological: He is alert and oriented to person, place, and time.  Skin: Skin is warm and dry. He is not diaphoretic.  Psychiatric: He has a normal mood and affect.  Nursing note and vitals reviewed.   ED Course  Procedures (including critical care time) Medications  nitroGLYCERIN (NITROGLYN) 2 % ointment 1 inch (1 inch Topical Given 07/18/14 1948)    Labs Review Labs Reviewed  CBC - Abnormal; Notable for the following:    WBC 11.5 (*)    All other components within normal limits  PRO B NATRIURETIC PEPTIDE  TROPONIN I  URINALYSIS, ROUTINE W REFLEX MICROSCOPIC  COMPREHENSIVE METABOLIC PANEL  URINE RAPID DRUG SCREEN (HOSP PERFORMED)  TROPONIN I  TROPONIN I  TROPONIN I    Imaging Review Dg Chest Portable 1 View  07/18/2014   CLINICAL DATA:  Shortness of breath, chest pain, nausea x2 days  EXAM: PORTABLE CHEST - 1 VIEW  COMPARISON:  05/01/2014  FINDINGS: Lungs are clear.  No pleural effusion or pneumothorax.  Cardiomegaly.  IMPRESSION: No evidence of acute cardiopulmonary disease.   Electronically Signed   By: Charline Bills M.D.   On: 07/18/2014 20:28     EKG Interpretation None       Date: 07/18/2014  Rate: 93  Rhythm: normal sinus rhythm  QRS Axis: normal  Intervals: normal  ST/T Wave abnormalities: normal  Conduction Disutrbances:none  Narrative Interpretation:   Old EKG Reviewed: unchanged   MDM   Final diagnoses:  Syncope and collapse  Chest pain, unspecified chest pain type    Filed Vitals:   07/18/14 2230  BP: 115/86  Pulse: 87  Temp:   Resp: 20   Afebrile, NAD, non-toxic appearing, AAOx4.  Concern for cardiac etiology of Chest Pain/Syncope. Hospitalist has been consulted and will see patient in the ED for likely admit. Pt does not meet criteria for CP protocol and a further evaluation is recommended. Pt has been re-evaluated prior to consult and VSS, NAD, heart RRR, pain 0/10, lungs CTAB. No acute abnormalities found on EKG and first  round of cardiac enzymes negative. This case was discussed with Dr. Hyacinth MeekerMiller who has seen the patient and agrees with plan to admit.      Jeannetta EllisJennifer L Emaya Preston, PA-C 07/18/14 2250  Vida RollerBrian D Miller, MD 07/18/14 [phone]

## 2014-07-19 ENCOUNTER — Observation Stay (HOSPITAL_COMMUNITY): Payer: Medicaid Other

## 2014-07-19 DIAGNOSIS — R55 Syncope and collapse: Secondary | ICD-10-CM

## 2014-07-19 DIAGNOSIS — R072 Precordial pain: Secondary | ICD-10-CM

## 2014-07-19 DIAGNOSIS — I519 Heart disease, unspecified: Secondary | ICD-10-CM

## 2014-07-19 LAB — LIPID PANEL
Cholesterol: 224 mg/dL — ABNORMAL HIGH (ref 0–200)
HDL: 41 mg/dL (ref >39)
LDL CALC: 117 mg/dL — AB (ref 0–99)
Total CHOL/HDL Ratio: 5.5 RATIO
Triglycerides: 332 mg/dL — ABNORMAL HIGH (ref <150)
VLDL: 66 mg/dL — ABNORMAL HIGH (ref 0–40)

## 2014-07-19 LAB — TROPONIN I
Troponin I: <0.3 ng/mL (ref <0.30)
Troponin I: <0.3 ng/mL (ref <0.30)

## 2014-07-19 LAB — TSH: TSH: 1.56 u[IU]/mL (ref 0.350–4.500)

## 2014-07-19 IMAGING — CT CT HEAD W/O CM
1 series · 16 of 29 positions shown, 20 images · non-contrast
Comparison: [DATE]

CLINICAL DATA: Syncope

EXAM:
CT HEAD WITHOUT CONTRAST
TECHNIQUE: Contiguous axial images were obtained from the base of the skull
through the vertex without intravenous contrast.

[Series 2: head 5.0 h30s · axial · 0.42mm/px · z∈[-95,+35]mm · 16 of 29 slices shown, 20 images]
[im 2/29  brain]
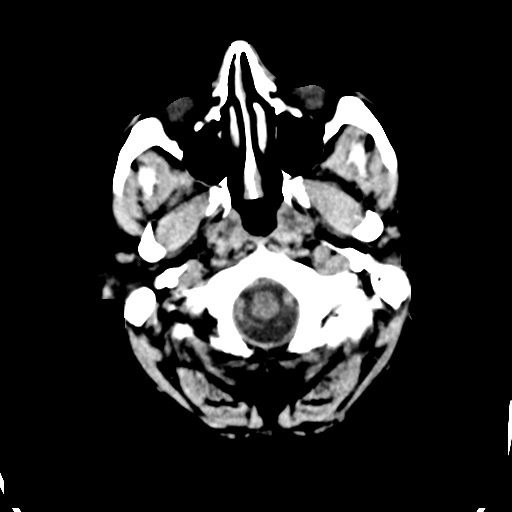
[im 2/29  bone]
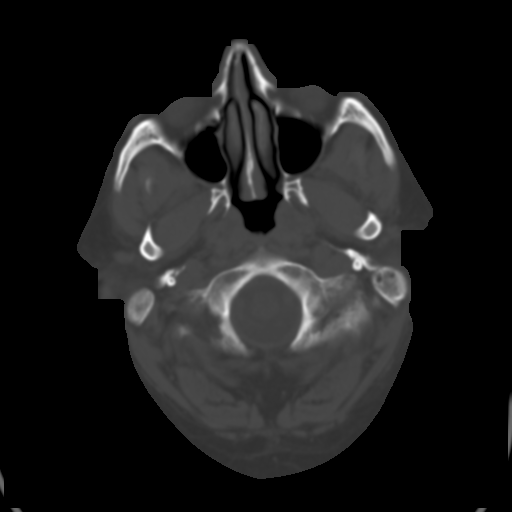
[im 4/29  brain]
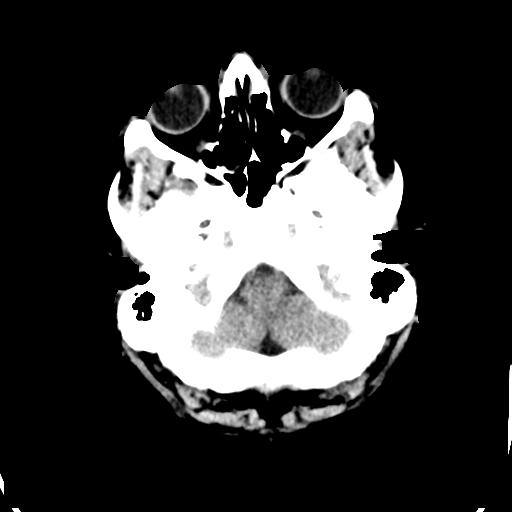
[im 6/29  brain]
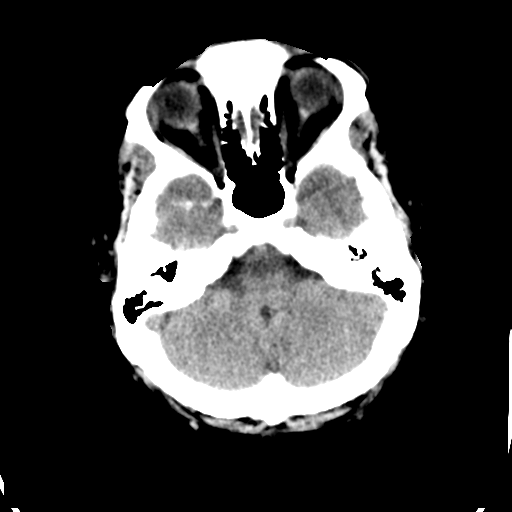
[im 7/29  brain]
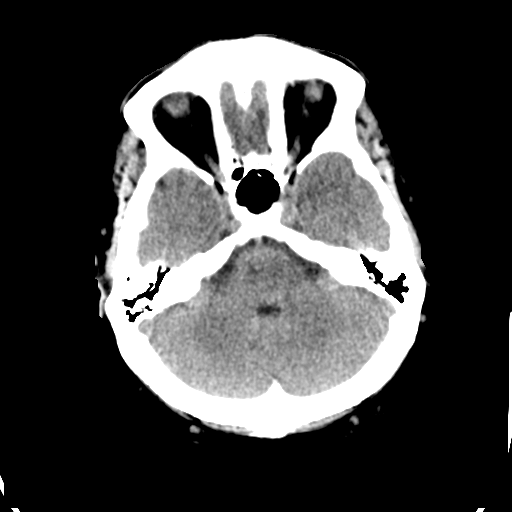
[im 9/29  brain]
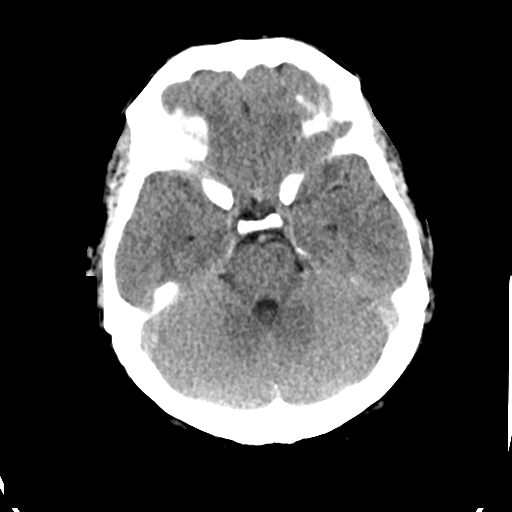
[im 9/29  bone]
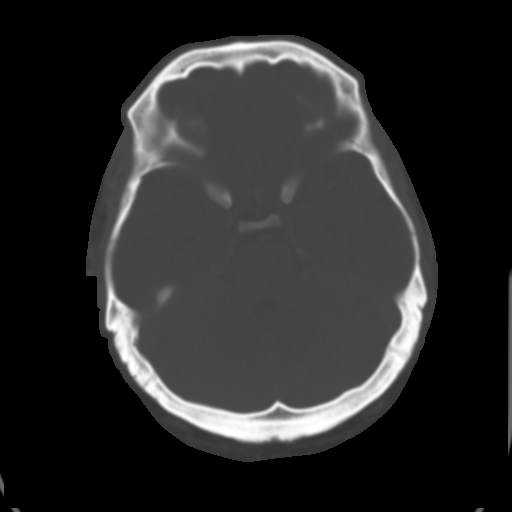
[im 11/29  brain]
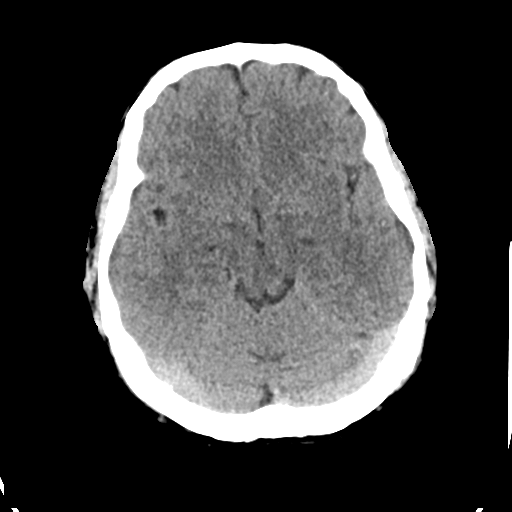
[im 12/29  brain]
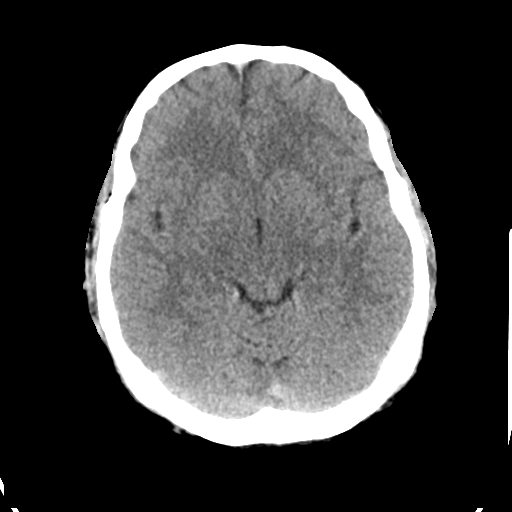
[im 14/29  brain]
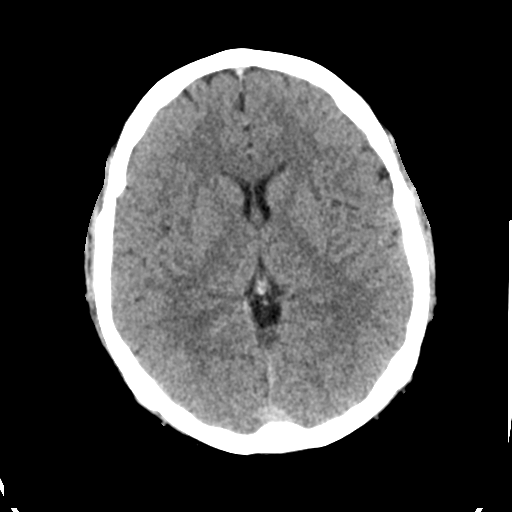
[im 16/29  brain]
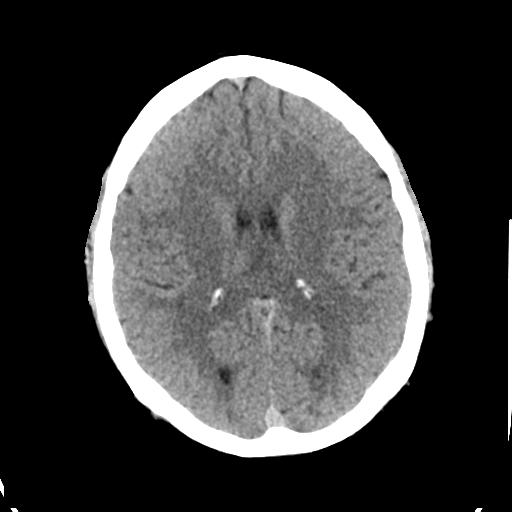
[im 16/29  bone]
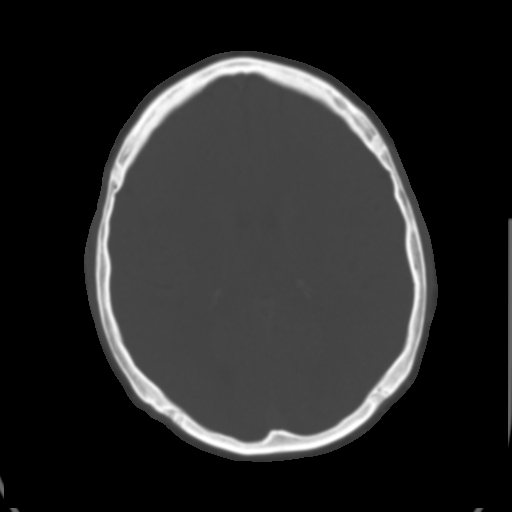
[im 18/29  brain]
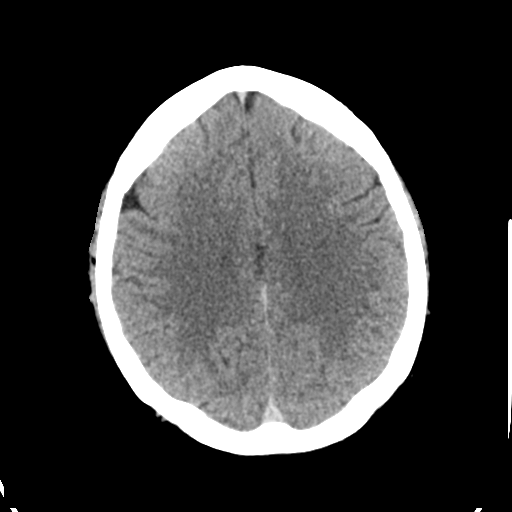
[im 19/29  brain]
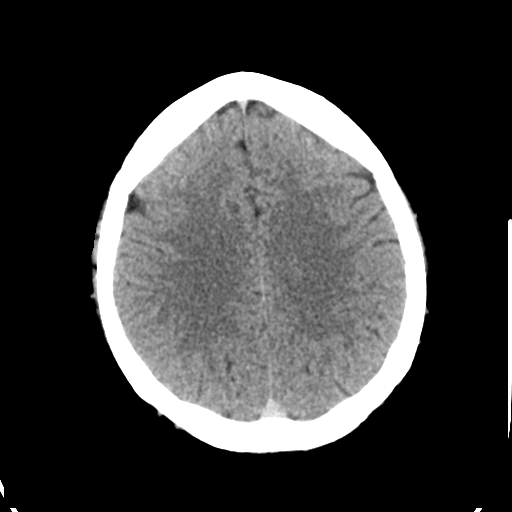
[im 21/29  brain]
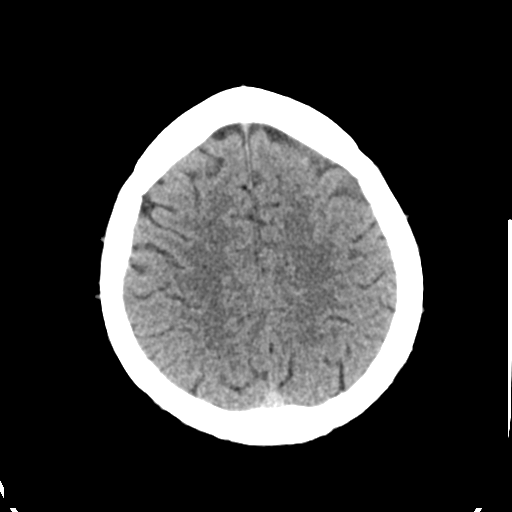
[im 23/29  brain]
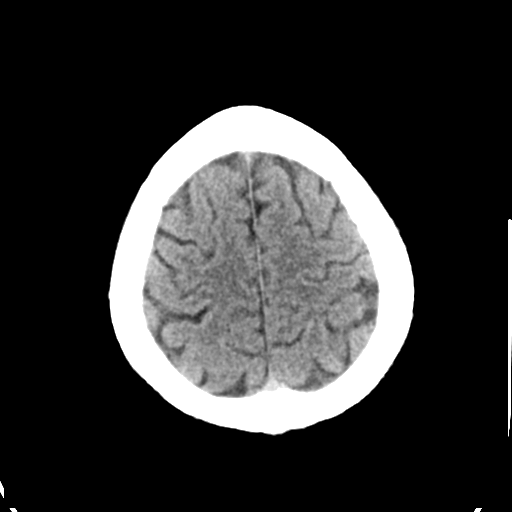
[im 23/29  bone]
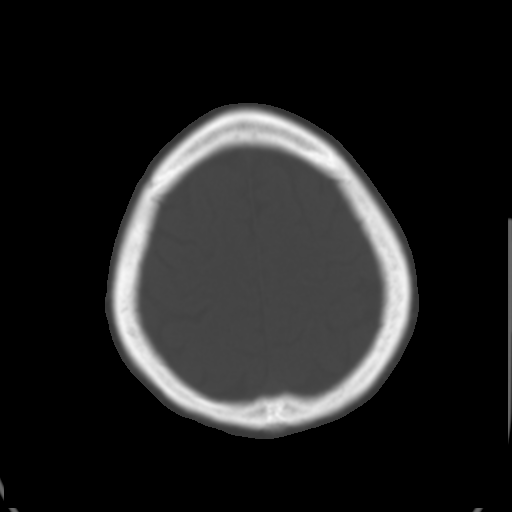
[im 24/29  brain]
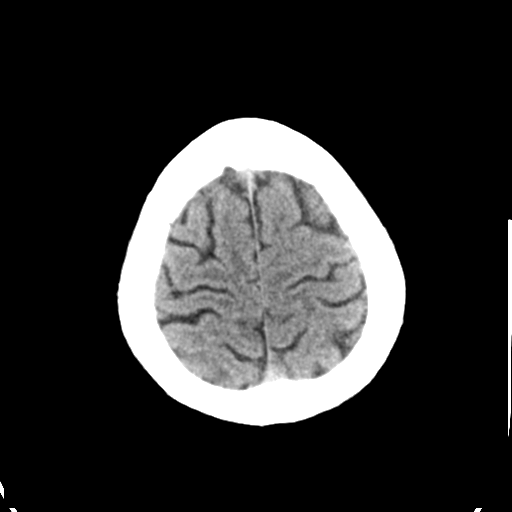
[im 26/29  brain]
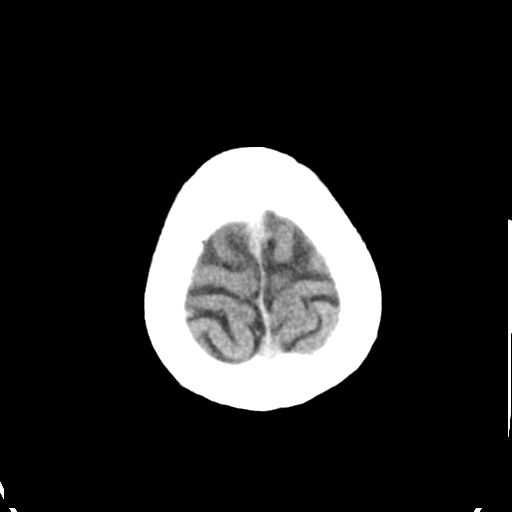
[im 28/29  brain]
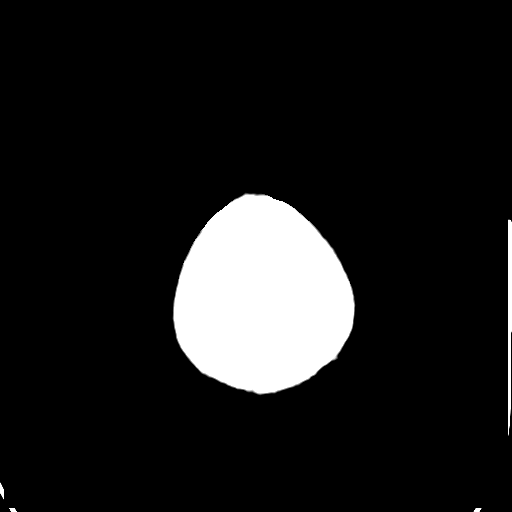

[16 of 29 positions shown; findings below may reference images not displayed]

FINDINGS: No evidence of parenchymal hemorrhage or extra-axial fluid
collection. No mass lesion, mass effect, or midline shift.

No CT evidence of acute infarction.

Cerebral volume is within normal limits.  No ventriculomegaly.

The visualized paranasal sinuses are essentially clear. The mastoid
air cells are unopacified.

No evidence of calvarial fracture.
IMPRESSION: No evidence of acute intracranial abnormality.

## 2014-07-19 MED ORDER — SODIUM CHLORIDE 0.9 % IV SOLN
INTRAVENOUS | Status: AC
Start: 2014-07-19 — End: 2014-07-19
  Administered 2014-07-19: 02:00:00 via INTRAVENOUS

## 2014-07-19 MED ORDER — CYCLOBENZAPRINE HCL 10 MG PO TABS: 5.0000 mg | ORAL_TABLET | Freq: Three times a day (TID) | ORAL | Status: DC | PRN

## 2014-07-19 MED ORDER — PRAVASTATIN SODIUM 40 MG PO TABS
40.0000 mg | ORAL_TABLET | Freq: Every day | ORAL | Status: DC
  Administered 2014-07-19 – 2014-07-20 (×2): 40 mg via ORAL
  Filled 2014-07-19 (×2): qty 1

## 2014-07-19 MED ORDER — ENOXAPARIN SODIUM 40 MG/0.4ML ~~LOC~~ SOLN
40.0000 mg | SUBCUTANEOUS | Status: DC
  Administered 2014-07-19 – 2014-07-20 (×2): 40 mg via SUBCUTANEOUS
  Filled 2014-07-19 (×2): qty 0.4

## 2014-07-19 MED ORDER — ACETAMINOPHEN 325 MG PO TABS
ORAL_TABLET | ORAL | Status: AC
  Filled 2014-07-19: qty 2

## 2014-07-19 MED ORDER — PRIMIDONE 50 MG PO TABS
300.0000 mg | ORAL_TABLET | Freq: Two times a day (BID) | ORAL | Status: DC
  Administered 2014-07-19 – 2014-07-20 (×4): 300 mg via ORAL
  Filled 2014-07-19 (×5): qty 1

## 2014-07-19 MED ORDER — MORPHINE SULFATE 2 MG/ML IJ SOLN: 2.0000 mg | INTRAMUSCULAR | Status: DC | PRN

## 2014-07-19 MED ORDER — NITROGLYCERIN 0.4 MG SL SUBL: 0.4000 mg | SUBLINGUAL_TABLET | SUBLINGUAL | Status: DC | PRN

## 2014-07-19 MED ORDER — ASPIRIN 81 MG PO CHEW
81.0000 mg | CHEWABLE_TABLET | Freq: Every day | ORAL | Status: DC
Start: 2014-07-19 — End: 2014-07-20
  Administered 2014-07-19 – 2014-07-20 (×2): 81 mg via ORAL
  Filled 2014-07-19 (×3): qty 1

## 2014-07-19 MED ORDER — ACETAMINOPHEN 325 MG PO TABS
650.0000 mg | ORAL_TABLET | ORAL | Status: DC | PRN
  Administered 2014-07-19: 650 mg via ORAL

## 2014-07-19 MED ORDER — ONDANSETRON HCL 4 MG/2ML IJ SOLN: 4.0000 mg | Freq: Four times a day (QID) | INTRAMUSCULAR | Status: DC | PRN

## 2014-07-19 NOTE — Consult Note (Signed)
CONSULTATION NOTE  Reason for Consult: Syncope, chest pain  Requesting Physician: Dr. Clementeen Graham   Cardiologist: Dr. Meda Coffee  HPI: This is a 43 y.o. male patient who was initially seen by Dr. Meda Coffee in March 2015. He has a history of hypertension, dyslipidemia and known coronary artery disease. 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 He's been living in [place] for about 4 years and was seen in March of this year for confusion and diaphoresis. He had a stress test at [facility] which was negative for ischemia. Ultimately aced on recurrent chest pain symptoms he was referred for repeat cardiac catheterization on 10/28/2013 which demonstrated no significant coronary artery disease and normal LV function. He also has a history of restless leg syndrome and obstructive sleep apnea.   He now presents with syncope in the setting of alcohol and caffeine use. He reports he had a syncopal episode in March when he presented with this chest pain however that was not recorded on his chart. He says he's had numerous syncopal episodes over the years and it was all "related to his heart". He has never worn a monitor for extended periods of time. He said this episode was with his son and was witnessed and he was walking out of the woods when he fell without any warning. He was apparently out for several minutes and then stood up and then fell down again and was out for. At time. He does not recall either of those episodes.  PMHx:  Past Medical History  Diagnosis Date  . Restless legs syndrome (RLS)   . Obstructive sleep apnea (adult) (pediatric)   . Pain in limb   . 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 Cough   . 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 Other and unspecified hyperlipidemia   . Backache, unspecified   . Unspecified essential hypertension   .  Diarrhea   . Unspecified vitamin D deficiency   . Pain in limb   . Unspecified asthma(493.90)   . Coronary atherosclerosis of unspecified type of vessel, native or graft   . MI (myocardial infarction) x5  . Diabetes mellitus without complication   . Parkinson disease    Past Surgical History  Procedure Laterality Date  . Cholecystectomy    . Coronary stent placement      FAMHx: Family History  Problem Relation Age of Onset  . Hypertension Father   . Heart disease Father   . Hypercholesterolemia Father   . Thyroid disease Father   . Stroke Father   . Cancer Father   . Hypertension Paternal Grandfather   . Heart disease Paternal Grandfather   . Hypercholesterolemia Paternal Grandfather   . Stroke Paternal Grandfather   . Colon cancer Paternal Grandfather   . Diabetes Paternal Grandfather   . Hypertension Sister   . Hypercholesterolemia Sister   . Heart disease Maternal Grandfather   . Diabetes Maternal Grandfather   . Lung cancer Maternal Grandmother   . Diabetes Maternal Grandmother   . Diabetes Paternal Grandmother   . Arthritis    . Brain cancer Mother     SOCHx:  reports that he has quit smoking. His smokeless tobacco use includes Chew. He reports that he drinks alcohol. He reports that he does not use illicit drugs.  ALLERGIES: Allergies  Allergen Reactions  . Bee Venom Anaphylaxis  .  Mushroom Extract Complex Anaphylaxis    RAW ONLY    ROS: A comprehensive review of systems was negative except for: Cardiovascular: positive for syncope  HOME MEDICATIONS: Prescriptions prior to admission  Medication Sig Dispense Refill Last Dose  . aspirin 81 MG tablet Take 81 mg by mouth daily.   07/18/2014 at Unknown time  . cyclobenzaprine (FLEXERIL) 5 MG tablet Take 5 mg by mouth 3 (three) times daily as needed for muscle spasms.   Past Week at Unknown time  . EPINEPHrine (EPIPEN 2-PAK) 0.3 mg/0.3 mL SOAJ injection Inject 0.3 mg into the muscle as needed (anaphylaxis).     unk  . nitroGLYCERIN (NITROSTAT) 0.4 MG SL tablet Place 0.4 mg under the tongue every 5 (five) minutes as needed for chest pain.   07/18/2014 at Unknown time  . pravastatin (PRAVACHOL) 40 MG tablet Take 40 mg by mouth daily.   07/17/2014 at Unknown time  . primidone (MYSOLINE) 50 MG tablet Take 300 mg by mouth 2 (two) times daily.    07/18/2014 at Unknown time    HOSPITAL MEDICATIONS: Prior to Admission:  Prescriptions prior to admission  Medication Sig Dispense Refill Last Dose  . aspirin 81 MG tablet Take 81 mg by mouth daily.   07/18/2014 at Unknown time  . cyclobenzaprine (FLEXERIL) 5 MG tablet Take 5 mg by mouth 3 (three) times daily as needed for muscle spasms.   Past Week at Unknown time  . EPINEPHrine (EPIPEN 2-PAK) 0.3 mg/0.3 mL SOAJ injection Inject 0.3 mg into the muscle as needed (anaphylaxis).    unk  . nitroGLYCERIN (NITROSTAT) 0.4 MG SL tablet Place 0.4 mg under the tongue every 5 (five) minutes as needed for chest pain.   07/18/2014 at Unknown time  . pravastatin (PRAVACHOL) 40 MG tablet Take 40 mg by mouth daily.   07/17/2014 at Unknown time  . primidone (MYSOLINE) 50 MG tablet Take 300 mg by mouth 2 (two) times daily.    07/18/2014 at Unknown time    VITALS: Blood pressure 126/68, pulse 88, temperature 98.7 F (37.1 C), temperature source Oral, resp. rate 20, height 5' 7"  (1.702 m), weight 198 lb 13.7 oz (90.2 kg), SpO2 97 %.  PHYSICAL EXAM: General appearance: alert and no distress Neck: no carotid bruit and no JVD Lungs: clear to auscultation bilaterally Heart: regular rate and rhythm, S1, S2 normal, no murmur, click, rub or gallop Abdomen: soft, non-tender; bowel sounds normal; no masses,  no organomegaly Extremities: extremities normal, atraumatic, no cyanosis or edema Pulses: 2+ and symmetric Skin: Skin color, texture, turgor normal. No rashes or lesions Neurologic: Grossly normal Psych: flat mood, affect  LABS: Results for orders placed or performed during the  hospital encounter of 07/18/14 (from the past 48 hour(s))  CBC     Status: Abnormal   Collection Time: 07/18/14  7:29 PM  Result Value Ref Range   WBC 11.5 (H) 4.0 - 10.5 K/uL   RBC 4.89 4.22 - 5.81 MIL/uL   Hemoglobin 15.8 13.0 - 17.0 g/dL   HCT 45.4 39.0 - 52.0 %   MCV 92.8 78.0 - 100.0 fL   MCH 32.3 26.0 - 34.0 pg   MCHC 34.8 30.0 - 36.0 g/dL   RDW 12.9 11.5 - 15.5 %   Platelets 255 150 - 400 K/uL  Troponin I (order at [facility])     Status: None   Collection Time: 07/18/14  7:29 PM  Result Value Ref Range   Troponin I <0.30 <0.30 ng/mL  Comment:        Due to the release kinetics of cTnI, a negative result within the first hours of the onset of symptoms does not rule out myocardial infarction with certainty. If myocardial infarction is still suspected, repeat the test at appropriate intervals.   Comprehensive metabolic panel     Status: None   Collection Time: 07/18/14  7:29 PM  Result Value Ref Range   Sodium 145 137 - 147 mEq/L   Potassium 4.1 3.7 - 5.3 mEq/L   Chloride 105 96 - 112 mEq/L   CO2 26 19 - 32 mEq/L   Glucose, Bld 91 70 - 99 mg/dL   BUN 11 6 - 23 mg/dL   Creatinine, Ser 0.83 0.50 - 1.35 mg/dL   Calcium 9.1 8.4 - 10.5 mg/dL   Total Protein 7.1 6.0 - 8.3 g/dL   Albumin 4.0 3.5 - 5.2 g/dL   AST 24 0 - 37 U/L   ALT 26 0 - 53 U/L   Alkaline Phosphatase 72 39 - 117 U/L   Total Bilirubin 0.4 0.3 - 1.2 mg/dL   GFR calc non Af Amer >90 >90 mL/min   GFR calc Af Amer >90 >90 mL/min    Comment: (NOTE) The eGFR has been calculated using the CKD EPI equation. This calculation has not been validated in all clinical situations. eGFR's persistently <90 mL/min signify possible Chronic Kidney Disease.    Anion gap 14 5 - 15  Pro b natriuretic peptide (order if patient c/o SOB ONLY)     Status: None   Collection Time: 07/18/14  7:33 PM  Result Value Ref Range   Pro B Natriuretic peptide (BNP) 43.5 0 - 125 pg/mL  Urinalysis, Routine w reflex microscopic     Status:  None   Collection Time: 07/18/14  7:45 PM  Result Value Ref Range   Color, Urine YELLOW YELLOW   APPearance CLEAR CLEAR   Specific Gravity, Urine 1.006 1.005 - 1.030   pH 5.5 5.0 - 8.0   Glucose, UA NEGATIVE NEGATIVE mg/dL   Hgb urine dipstick NEGATIVE NEGATIVE   Bilirubin Urine NEGATIVE NEGATIVE   Ketones, ur NEGATIVE NEGATIVE mg/dL   Protein, ur NEGATIVE NEGATIVE mg/dL   Urobilinogen, UA 0.2 0.0 - 1.0 mg/dL   Nitrite NEGATIVE NEGATIVE   Leukocytes, UA NEGATIVE NEGATIVE    Comment: MICROSCOPIC NOT DONE ON URINES WITH NEGATIVE PROTEIN, BLOOD, LEUKOCYTES, NITRITE, OR GLUCOSE <1000 mg/dL.  Urine rapid drug screen (hosp performed)     Status: None   Collection Time: 07/18/14  8:40 PM  Result Value Ref Range   Opiates NONE DETECTED NONE DETECTED   Cocaine NONE DETECTED NONE DETECTED   Benzodiazepines NONE DETECTED NONE DETECTED   Amphetamines NONE DETECTED NONE DETECTED   Tetrahydrocannabinol NONE DETECTED NONE DETECTED   Barbiturates NONE DETECTED NONE DETECTED    Comment:        DRUG SCREEN FOR MEDICAL PURPOSES ONLY.  IF CONFIRMATION IS NEEDED FOR ANY PURPOSE, NOTIFY LAB WITHIN 5 DAYS.        LOWEST DETECTABLE LIMITS FOR URINE DRUG SCREEN Drug Class       Cutoff (ng/mL) Amphetamine      1000 Barbiturate      200 Benzodiazepine   165 Tricyclics       537 Opiates          300 Cocaine          300 THC  50   Troponin I (q 6hr x 3)     Status: None   Collection Time: 07/18/14 10:27 PM  Result Value Ref Range   Troponin I <0.30 <0.30 ng/mL    Comment:        Due to the release kinetics of cTnI, a negative result within the first hours of the onset of symptoms does not rule out myocardial infarction with certainty. If myocardial infarction is still suspected, repeat the test at appropriate intervals.   Troponin I (q 6hr x 3)     Status: None   Collection Time: 07/19/14  4:30 AM  Result Value Ref Range   Troponin I <0.30 <0.30 ng/mL    Comment:        Due  to the release kinetics of cTnI, a negative result within the first hours of the onset of symptoms does not rule out myocardial infarction with certainty. If myocardial infarction is still suspected, repeat the test at appropriate intervals.   TSH     Status: None   Collection Time: 07/19/14  4:30 AM  Result Value Ref Range   TSH 1.560 0.350 - 4.500 uIU/mL  Lipid panel     Status: Abnormal   Collection Time: 07/19/14  4:30 AM  Result Value Ref Range   Cholesterol 224 (H) 0 - 200 mg/dL   Triglycerides 332 (H) <150 mg/dL   HDL 41 >39 mg/dL   Total CHOL/HDL Ratio 5.5 RATIO   VLDL 66 (H) 0 - 40 mg/dL   LDL Cholesterol 117 (H) 0 - 99 mg/dL    Comment:        Total Cholesterol/HDL:CHD Risk Coronary Heart Disease Risk Table                     Men   Women  1/2 Average Risk   3.4   3.3  Average Risk       5.0   4.4  2 X Average Risk   9.6   7.1  3 X Average Risk  23.4   11.0        Use the calculated Patient Ratio above and the CHD Risk Table to determine the patient's CHD Risk.        ATP III CLASSIFICATION (LDL):  <100     mg/dL   Optimal  100-129  mg/dL   Near or Above                    Optimal  130-159  mg/dL   Borderline  160-189  mg/dL   High  >190     mg/dL   Very High     IMAGING: Ct Head Wo Contrast  07/19/2014   CLINICAL DATA:  Syncope  EXAM: CT HEAD WITHOUT CONTRAST  TECHNIQUE: Contiguous axial images were obtained from the base of the skull through the vertex without intravenous contrast.  COMPARISON:  11/22/2010  FINDINGS: No evidence of parenchymal hemorrhage or extra-axial fluid collection. No mass lesion, mass effect, or midline shift.  No CT evidence of acute infarction.  Cerebral volume is within normal limits.  No ventriculomegaly.  The visualized paranasal sinuses are essentially clear. The mastoid air cells are unopacified.  No evidence of calvarial fracture.  IMPRESSION: No evidence of acute intracranial abnormality.   Electronically Signed   By: Julian Hy M.D.   On: 07/19/2014 00:29   Dg Chest Portable 1 View  07/18/2014   CLINICAL DATA:  Shortness of breath, chest  pain, nausea x2 days  EXAM: PORTABLE CHEST - 1 VIEW  COMPARISON:  05/01/2014  FINDINGS: Lungs are clear.  No pleural effusion or pneumothorax.  Cardiomegaly.  IMPRESSION: No evidence of acute cardiopulmonary disease.   Electronically Signed   By: Julian Hy M.D.   On: 07/18/2014 20:28    HOSPITAL DIAGNOSES: Active Problems:   Unstable angina   Essential hypertension   Hyperlipidemia   Syncope and collapse   Syncope   Chest pain   Pain in the chest   Parkinson disease   IMPRESSION: 1. Recurrent Syncope  RECOMMENDATION: 1. Mr. Pimenta is having recurrent syncope and had a negative cardiac workup in March of this year which showed very minimal coronary disease. He does have normal EF which makes life threatening arrhythmia less likely. There was no clear prodrome to this episode however there was some alcohol use prior to it. He could potentially be having paroxysmal atrial fibrillation with a rapid ventricular response. The episodes are very infrequent therefore would be difficult to capture on a monitor. I'm recommending an implanted loop recorder. We should be able to get that placed tomorrow through EP. I do not believe he needs to be nothing by mouth for the procedure as it is done under local anesthesia.  Follow-up is recommended with EP and Dr. Meda Coffee.  Time Spent Directly with Patient: 45 minutes  Pixie Casino, MD, [facility] Attending Cardiologist [facility]  Cloys Vera C 07/19/2014, 10:20 AM

## 2014-07-19 NOTE — Progress Notes (Addendum)
[facility] PROGRESS NOTE  Samuel CrownMichael A Williams WUJ:[id number] DOB: April 08, 1971 DOA: 07/18/2014 PCP: Ailene RavelHAMRICK,MAURA L, MD  Assessment/Plan: Syncope  vasovagal vs neurogenic Stable on tele Serial troponin negative. Reports having these symptoms for past several years. Had one episode earlier this year. orthostasis negative. doesnot recall the events. No witnessed seizures. Seen by cardiology. Plan on implantable loop recorder in am.  Neurology consulted. Ordered MRI brain and EEG  Essential Hypertension stable  Chest pain  Seems atypical. Has has 3 cardiac caths , most recent one earlier this years negative for ischemia 2D echo ordered. continue ASA  Parkinson's disease Continue home meds  hypperlipidemia  continue statin   DVT prophylaxis: sq lovenox  Diet: cardiac  Code Status: full code Family Communication: son at bedside Disposition Plan: home once w/up completed   Consultants:  cardiology   neurology  Procedures:  none  Antibiotics:  none  HPI/Subjective: Seen and examined . Admission H&P reviewed. Denies headache or dizziness at this time.  Objective: Filed Vitals:   07/19/14 0500  BP: 126/68  Pulse: 88  Temp: 98.7 F (37.1 C)  Resp: 20   No intake or output data in the 24 hours ending 07/19/14 1047 Filed Weights   07/18/14 1934 07/18/14 2308  Weight: 87.091 kg (192 lb) 90.2 kg (198 lb 13.7 oz)    Exam:   General: middle aged male in NAD   HEENT: no pallor, moist oral mucosa  Chest: clear b/l, no added sounds  CVS: N S1&S2, no murmurs  Abd: soft, NT, ND, BS+  Ext: warm, no edema  C NS: alert and oreinted . Non focal   Data Reviewed: Basic Metabolic Panel:  Recent Labs Lab 07/18/14 1929  NA 145  K 4.1  CL 105  CO2 26  GLUCOSE 91  BUN 11  CREATININE 0.83  CALCIUM 9.1   Liver Function Tests:  Recent Labs Lab 07/18/14 1929  AST 24  ALT 26  ALKPHOS 72  BILITOT 0.4  PROT 7.1  ALBUMIN 4.0   No results for  input(s): LIPASE, AMYLASE in the last 168 hours. No results for input(s): AMMONIA in the last 168 hours. CBC:  Recent Labs Lab 07/18/14 1929  WBC 11.5*  HGB 15.8  HCT 45.4  MCV 92.8  PLT 255   Cardiac Enzymes:  Recent Labs Lab 07/18/14 1929 07/18/14 2227 07/19/14 0430  TROPONINI <0.30 <0.30 <0.30   BNP (last 3 results)  Recent Labs  07/18/14 1933  PROBNP 43.5   CBG: No results for input(s): GLUCAP in the last 168 hours.  No results found for this or any previous visit (from the past 240 hour(s)).   Studies: Ct Head Wo Contrast  07/19/2014   CLINICAL DATA:  Syncope  EXAM: CT HEAD WITHOUT CONTRAST  TECHNIQUE: Contiguous axial images were obtained from the base of the skull through the vertex without intravenous contrast.  COMPARISON:  11/22/2010  FINDINGS: No evidence of parenchymal hemorrhage or extra-axial fluid collection. No mass lesion, mass effect, or midline shift.  No CT evidence of acute infarction.  Cerebral volume is within normal limits.  No ventriculomegaly.  The visualized paranasal sinuses are essentially clear. The mastoid air cells are unopacified.  No evidence of calvarial fracture.  IMPRESSION: No evidence of acute intracranial abnormality.   Electronically Signed   By: Charline BillsSriyesh  Krishnan M.D.   On: 07/19/2014 00:29   Dg Chest Portable 1 View  07/18/2014   CLINICAL DATA:  Shortness of breath, chest pain, nausea x2 days  EXAM: PORTABLE CHEST - 1 VIEW  COMPARISON:  05/01/2014  FINDINGS: Lungs are clear.  No pleural effusion or pneumothorax.  Cardiomegaly.  IMPRESSION: No evidence of acute cardiopulmonary disease.   Electronically Signed   By: Charline BillsSriyesh  Krishnan M.D.   On: 07/18/2014 20:28    Scheduled Meds: . aspirin  81 mg Oral Daily  . enoxaparin (LOVENOX) injection  40 mg Subcutaneous Q24H  . pravastatin  40 mg Oral Daily  . primidone  300 mg Oral BID   Continuous Infusions: . sodium chloride 75 mL/hr at 07/19/14 0130      Time spent: 25  minutes    Dyanna Seiter  [facility] Pager [phone]. If 7PM-7AM, please contact night-coverage at [email or web address], password [email or web address] 07/19/2014, 10:47 AM  LOS: 1 day

## 2014-07-19 NOTE — Progress Notes (Signed)
Utilization Review Completed.   Kade Demicco, RN, BSN Nurse Case Manager  

## 2014-07-19 NOTE — Progress Notes (Signed)
  Echocardiogram 2D Echocardiogram has been performed.  Samuel Williams, Samuel Williams 07/19/2014, 12:27 PM

## 2014-07-19 NOTE — Consult Note (Signed)
Consult Reason for Consult:syncope Referring Physician: Dr Gonzella Lexhungel  CC: "I passed out"  HPI: Samuel Williams is an 43 y.o. male presenting for recurrent episodes of loss of consciousness with question of seizure vs syncope. Notes working in the woods, drank 5-6 beers, stood up and passed out. Stood up and passed out again. Started walking towards house and passed out again. Each episode lasted around 10minutes. No noted abnormal movements. Son reports patient did hit his head during a fall. Denies any aura or symptoms prior to LOC. Son reports minimal confusion after the event.   Had cardiac cath in March 2015 which was unremarkable. Of note patient has diagnosis of Parkinson's disease states [facility]. Reports being on primidone? No notes available in care everywhere.   MRI brain imaging was reviewed, is overall unremarkable.   Past Medical History  Diagnosis Date  . Restless legs syndrome (RLS)   . Obstructive sleep apnea (adult) (pediatric)   . Pain in limb   . 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 Cough   . 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 Other and unspecified hyperlipidemia   . Backache, unspecified   . Unspecified essential hypertension   . Diarrhea   . Unspecified vitamin D deficiency   . Pain in limb   . Unspecified asthma(493.90)   . Coronary atherosclerosis of unspecified type of vessel, native or graft   . MI (myocardial infarction) x5  . Diabetes mellitus without complication   . Parkinson disease     Past Surgical History  Procedure Laterality Date  . Cholecystectomy    . Coronary stent placement      Family History  Problem Relation Age of Onset  . Hypertension Father   . Heart disease Father   . Hypercholesterolemia Father   . Thyroid disease Father   . Stroke Father   . Cancer Father   . Hypertension Paternal Grandfather   . Heart disease Paternal Grandfather   .  Hypercholesterolemia Paternal Grandfather   . Stroke Paternal Grandfather   . Colon cancer Paternal Grandfather   . Diabetes Paternal Grandfather   . Hypertension Sister   . Hypercholesterolemia Sister   . Heart disease Maternal Grandfather   . Diabetes Maternal Grandfather   . Lung cancer Maternal Grandmother   . Diabetes Maternal Grandmother   . Diabetes Paternal Grandmother   . Arthritis    . Brain cancer Mother     Social History:  reports that he has quit smoking. His smokeless tobacco use includes Chew. He reports that he drinks alcohol. He reports that he does not use illicit drugs.  Allergies  Allergen Reactions  . Bee Venom Anaphylaxis  . Mushroom Extract Complex Anaphylaxis    RAW ONLY    Medications:  Scheduled: . aspirin  81 mg Oral Daily  . enoxaparin (LOVENOX) injection  40 mg Subcutaneous Q24H  . pravastatin  40 mg Oral Daily  . primidone  300 mg Oral BID     ROS: Out of a complete 14 system review, the patient complains of only the following symptoms, and all other reviewed systems are negative. + fatigue, denies all other ROS  Physical Examination: Filed Vitals:   07/19/14 1402  BP: 165/90  Pulse: 94  Temp: 98.6 F (37 C)  Resp: 18   Physical Exam  Constitutional: He appears well-developed and well-nourished.  Psych: Affect appropriate to situation Eyes: No scleral injection HENT: No OP obstrucion  Head: Normocephalic.  Cardiovascular: Normal rate and regular rhythm.  Respiratory: Effort normal and breath sounds normal.  GI: Soft. Bowel sounds are normal. No distension. There is no tenderness.  Skin: WDI  Neurologic Examination Mental Status: Alert, oriented, thought content appropriate.  Speech fluent without evidence of aphasia.  Able to follow 3 step commands without difficulty. Cranial Nerves: II: unable to visualize fundi, visual fields grossly normal, pupils equal, round, reactive to light and accommodation III,IV, VI: ptosis  not present, extra-ocular motions intact bilaterally V,VII: smile symmetric, facial light touch sensation normal bilaterally VIII: hearing normal bilaterally IX,X: cough reflex present XI: trapezius strength/neck flexion strength normal bilaterally XII: tongue strength normal  Motor: 5/5 strength in all extremities.  Minimal masked facies noted, no postural resting or intention tremor noted. Minimal bradykinesia with finger taps Tone and bulk:normal tone throughout; no atrophy noted Sensory: Pinprick and light touch intact throughout, bilaterally Deep Tendon Reflexes: 1+ and symmetric throughout Plantars: Right: downgoing   Left: downgoing Cerebellar: normal finger-to-nose, normal rapid alternating movements and normal heel-to-shin test Gait: normal gait and station  Laboratory Studies:   Basic Metabolic Panel:  Recent Labs Lab 07/18/14 1929  NA 145  K 4.1  CL 105  CO2 26  GLUCOSE 91  BUN 11  CREATININE 0.83  CALCIUM 9.1    Liver Function Tests:  Recent Labs Lab 07/18/14 1929  AST 24  ALT 26  ALKPHOS 72  BILITOT 0.4  PROT 7.1  ALBUMIN 4.0   No results for input(s): LIPASE, AMYLASE in the last 168 hours. No results for input(s): AMMONIA in the last 168 hours.  CBC:  Recent Labs Lab 07/18/14 1929  WBC 11.5*  HGB 15.8  HCT 45.4  MCV 92.8  PLT 255    Cardiac Enzymes:  Recent Labs Lab 07/18/14 1929 07/18/14 2227 07/19/14 0430 07/19/14 1003  TROPONINI <0.30 <0.30 <0.30 <0.30    BNP: Invalid input(s): POCBNP  CBG: No results for input(s): GLUCAP in the last 168 hours.  Microbiology: No results found for this or any previous visit.  Coagulation Studies: No results for input(s): LABPROT, INR in the last 72 hours.  Urinalysis:  Recent Labs Lab 07/18/14 1945  COLORURINE YELLOW  LABSPEC 1.006  PHURINE 5.5  GLUCOSEU NEGATIVE  HGBUR NEGATIVE  BILIRUBINUR NEGATIVE  KETONESUR NEGATIVE  PROTEINUR NEGATIVE  UROBILINOGEN 0.2  NITRITE  NEGATIVE  LEUKOCYTESUR NEGATIVE    Lipid Panel:     Component Value Date/Time   CHOL 224* 07/19/2014 0430   TRIG 332* 07/19/2014 0430   HDL 41 07/19/2014 0430   CHOLHDL 5.5 07/19/2014 0430   VLDL 66* 07/19/2014 0430   LDLCALC 117* 07/19/2014 0430    HgbA1C: No results found for: HGBA1C  Urine Drug Screen:     Component Value Date/Time   LABOPIA NONE DETECTED 07/18/2014 2040   COCAINSCRNUR NONE DETECTED 07/18/2014 2040   LABBENZ NONE DETECTED 07/18/2014 2040   AMPHETMU NONE DETECTED 07/18/2014 2040   THCU NONE DETECTED 07/18/2014 2040   LABBARB NONE DETECTED 07/18/2014 2040    Alcohol Level: No results for input(s): ETH in the last 168 hours.  Other results:  Imaging: Ct Head Wo Contrast  07/19/2014   CLINICAL DATA:  Syncope  EXAM: CT HEAD WITHOUT CONTRAST  TECHNIQUE: Contiguous axial images were obtained from the base of the skull through the vertex without intravenous contrast.  COMPARISON:  11/22/2010  FINDINGS: No evidence of parenchymal hemorrhage or extra-axial fluid collection. No mass lesion, mass effect, or midline shift.  No CT evidence of acute infarction.  Cerebral volume is within normal limits.  No ventriculomegaly.  The visualized paranasal sinuses are essentially clear. The mastoid air cells are unopacified.  No evidence of calvarial fracture.  IMPRESSION: No evidence of acute intracranial abnormality.   Electronically Signed   By: Charline Bills M.D.   On: 07/19/2014 00:29   Dg Chest Portable 1 View  07/18/2014   CLINICAL DATA:  Shortness of breath, chest pain, nausea x2 days  EXAM: PORTABLE CHEST - 1 VIEW  COMPARISON:  05/01/2014  FINDINGS: Lungs are clear.  No pleural effusion or pneumothorax.  Cardiomegaly.  IMPRESSION: No evidence of acute cardiopulmonary disease.   Electronically Signed   By: Charline Bills M.D.   On: 07/18/2014 20:28     Assessment/Plan:  43y/o gentleman with history CAD, DM, RLS and self reported history of Parkinson's disease  presenting with recurrent episodes of loss of consciousness. Unclear etiology. Differential would be seizure vs syncope. Duration of LOC is more consistent with seizure but history is atypical for this. EtOH intake coupled with high dose of primidone likely played a role in his symptoms.   Unclear what patients underlying movement disorder is. He reports he was diagnosed with Parkinson's and takes primidone for this. Primidone is not a standard PD medication and is more commonly used for essential tremor. He is also on a very high dose of primidone.    -check carotid doppler -EEG -cardiology for loop recorder placement -suggest outpatient neurology follow up for clarification of underlying movement disorder and need for high dose primidone. Can return to [facility] or if he prefers a local doctor suggest follow up with Dr Tat at [facility] -will continue to follow  Elspeth Cho, DO [facility] [phone]  If 7pm- 7am, please page neurology on call as listed in AMION. 07/19/2014, 4:06 PM

## 2014-07-20 ENCOUNTER — Observation Stay (HOSPITAL_COMMUNITY): Payer: Medicaid Other

## 2014-07-20 ENCOUNTER — Encounter (HOSPITAL_COMMUNITY): Payer: Self-pay | Admitting: *Deleted

## 2014-07-20 ENCOUNTER — Encounter (HOSPITAL_COMMUNITY)
Admission: EM | Disposition: A | Discharge status: Home / Self Care | Payer: Self-pay | Source: Home / Self Care | Attending: Internal Medicine

## 2014-07-20 DIAGNOSIS — Z91030 Bee allergy status: Secondary | ICD-10-CM | POA: Diagnosis not present

## 2014-07-20 DIAGNOSIS — Z7982 Long term (current) use of aspirin: Secondary | ICD-10-CM | POA: Diagnosis not present

## 2014-07-20 DIAGNOSIS — Z8249 Family history of ischemic heart disease and other diseases of the circulatory system: Secondary | ICD-10-CM | POA: Diagnosis not present

## 2014-07-20 DIAGNOSIS — I10 Essential (primary) hypertension: Secondary | ICD-10-CM | POA: Diagnosis present

## 2014-07-20 DIAGNOSIS — G20 Parkinson's disease: Secondary | ICD-10-CM | POA: Diagnosis present

## 2014-07-20 DIAGNOSIS — E119 Type 2 diabetes mellitus without complications: Secondary | ICD-10-CM | POA: Diagnosis present

## 2014-07-20 DIAGNOSIS — I252 Old myocardial infarction: Secondary | ICD-10-CM | POA: Diagnosis not present

## 2014-07-20 DIAGNOSIS — G2581 Restless legs syndrome: Secondary | ICD-10-CM | POA: Diagnosis present

## 2014-07-20 DIAGNOSIS — G4733 Obstructive sleep apnea (adult) (pediatric): Secondary | ICD-10-CM | POA: Diagnosis present

## 2014-07-20 DIAGNOSIS — Z955 Presence of coronary angioplasty implant and graft: Secondary | ICD-10-CM | POA: Diagnosis not present

## 2014-07-20 DIAGNOSIS — F1010 Alcohol abuse, uncomplicated: Secondary | ICD-10-CM | POA: Diagnosis present

## 2014-07-20 DIAGNOSIS — T426X5A Adverse effect of other antiepileptic and sedative-hypnotic drugs, initial encounter: Secondary | ICD-10-CM | POA: Diagnosis present

## 2014-07-20 DIAGNOSIS — Z833 Family history of diabetes mellitus: Secondary | ICD-10-CM | POA: Diagnosis not present

## 2014-07-20 DIAGNOSIS — I2510 Atherosclerotic heart disease of native coronary artery without angina pectoris: Secondary | ICD-10-CM | POA: Diagnosis present

## 2014-07-20 DIAGNOSIS — R55 Syncope and collapse: Secondary | ICD-10-CM

## 2014-07-20 DIAGNOSIS — E785 Hyperlipidemia, unspecified: Secondary | ICD-10-CM | POA: Diagnosis present

## 2014-07-20 DIAGNOSIS — F17220 Nicotine dependence, chewing tobacco, uncomplicated: Secondary | ICD-10-CM | POA: Diagnosis present

## 2014-07-20 DIAGNOSIS — T426X1A Poisoning by other antiepileptic and sedative-hypnotic drugs, accidental (unintentional), initial encounter: Secondary | ICD-10-CM | POA: Diagnosis present

## 2014-07-20 HISTORY — PX: LOOP RECORDER IMPLANT: SHX5477

## 2014-07-20 LAB — CBC
HCT: 44.8 % (ref 39.0–52.0)
Hemoglobin: 15.8 g/dL (ref 13.0–17.0)
MCH: 32.3 pg (ref 26.0–34.0)
MCHC: 35.3 g/dL (ref 30.0–36.0)
MCV: 91.6 fL (ref 78.0–100.0)
Platelets: 288 10*3/uL (ref 150–400)
RBC: 4.89 MIL/uL (ref 4.22–5.81)
RDW: 12.5 % (ref 11.5–15.5)
WBC: 9.6 10*3/uL (ref 4.0–10.5)

## 2014-07-20 LAB — CREATININE, SERUM
CREATININE: 0.81 mg/dL (ref 0.50–1.35)
GFR calc non Af Amer: >90 mL/min (ref >90)

## 2014-07-20 SURGERY — LOOP RECORDER IMPLANT
Anesthesia: LOCAL

## 2014-07-20 MED ORDER — HEPARIN SODIUM (PORCINE) 5000 UNIT/ML IJ SOLN: 5000.0000 [IU] | Freq: Three times a day (TID) | INTRAMUSCULAR | Status: DC

## 2014-07-20 MED ORDER — HYDRALAZINE HCL 25 MG PO TABS
25.0000 mg | ORAL_TABLET | Freq: Once | ORAL | Status: AC
  Administered 2014-07-20: 25 mg via ORAL
  Filled 2014-07-20: qty 1

## 2014-07-20 MED ORDER — LIDOCAINE HCL (PF) 1 % IJ SOLN
INTRAMUSCULAR | Status: AC
  Filled 2014-07-20: qty 30

## 2014-07-20 MED ORDER — AMLODIPINE BESYLATE 5 MG PO TABS: 5.0000 mg | ORAL_TABLET | Freq: Every day | ORAL | Status: DC

## 2014-07-20 MED ORDER — ONDANSETRON HCL 4 MG/2ML IJ SOLN: 4.0000 mg | Freq: Four times a day (QID) | INTRAMUSCULAR | Status: DC | PRN

## 2014-07-20 MED ORDER — ACETAMINOPHEN 325 MG PO TABS: 325.0000 mg | ORAL_TABLET | ORAL | Status: DC | PRN

## 2014-07-20 NOTE — Consult Note (Signed)
ELECTROPHYSIOLOGY CONSULT NOTE    Patient ID: Samuel Williams MRN: [id number], DOB/AGE: 17-Jan-1971 43 y.o.  Admit date: 07/18/2014 Date of Consult: 07-20-2014  Primary Physician: Ailene RavelHAMRICK,MAURA L, MD Primary Cardiologist: Delton SeeNelson  Reason for Consultation: syncope  HPI:  Samuel CrownMichael A Williams is a 43 y.o. male with a past medical history significant for Parkinsons, hyperlipidemia, diabetes, sleep apnea.  He was with his son on the day of admission when he had 3 episodes of syncope.  All occurred after trying to stand.  They were not associated with any prodrome.  When he awoke after the 3rd episode, he had residual dizziness but otherwise felt well. [facility].  Neurology has also consulted and done EEG with results pending. Orthostatic vitals were unrevealing. He was not orthostatic.   Echo done but not yet read due to IT issues. Catheterization 10/2013 demonstrated no obstructive CAD with normal LV function.   Telemetry during admission has demonstrated sinus rhythm with no arrhythmias.    Lab work is reviewed  EP has been asked to evaluate for placement of implantable loop recorder   Past Medical History  Diagnosis Date  . Restless legs syndrome (RLS)   . Obstructive sleep apnea (adult) (pediatric)   . Pain in limb   . 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 Hyperlipidemia   . Other and unspecified hyperlipidemia   . Backache, unspecified   . Unspecified essential hypertension   . Unspecified vitamin D deficiency   . Unspecified asthma(493.90)   . MI (myocardial infarction) x5    per report, normal catheterization 2015  . Diabetes mellitus without complication   . Parkinson disease      Surgical History:  Past Surgical History  Procedure Laterality Date  . Cholecystectomy    . Cardiac catheterization  2015    non obstructive CAD, normal LV function     Prescriptions prior to admission  Medication Sig  Dispense Refill Last Dose  . aspirin 81 MG tablet Take 81 mg by mouth daily.   07/18/2014 at Unknown time  . cyclobenzaprine (FLEXERIL) 5 MG tablet Take 5 mg by mouth 3 (three) times daily as needed for muscle spasms.   Past Week at Unknown time  . EPINEPHrine (EPIPEN 2-PAK) 0.3 mg/0.3 mL SOAJ injection Inject 0.3 mg into the muscle as needed (anaphylaxis).    unk  . nitroGLYCERIN (NITROSTAT) 0.4 MG SL tablet Place 0.4 mg under the tongue every 5 (five) minutes as needed for chest pain.   07/18/2014 at Unknown time  . pravastatin (PRAVACHOL) 40 MG tablet Take 40 mg by mouth daily.   07/17/2014 at Unknown time  . primidone (MYSOLINE) 50 MG tablet Take 300 mg by mouth 2 (two) times daily.    07/18/2014 at Unknown time    Inpatient Medications:  . aspirin  81 mg Oral Daily  . enoxaparin (LOVENOX) injection  40 mg Subcutaneous Q24H  . pravastatin  40 mg Oral Daily  . primidone  300 mg Oral BID    Allergies:  Allergies  Allergen Reactions  . Bee Venom Anaphylaxis  . Mushroom Extract Complex Anaphylaxis    RAW ONLY    History   Social History  . Marital Status: Single    Spouse Name: N/A    Number of Children: N/A  . Years of Education: N/A   Occupational History  . Not on file.   Social History Main Topics  . Smoking status: Former Games developermoker  .  Smokeless tobacco: Current User    Types: Chew  . Alcohol Use: Yes     Comment: occasional  . Drug Use: No  . Sexual Activity: Not on file   Other Topics Concern  . Not on file   Social History Narrative     Family History  Problem Relation Age of Onset  . Hypertension Father   . Heart disease Father   . Hypercholesterolemia Father   . Thyroid disease Father   . Stroke Father   . Cancer Father   . Hypertension Paternal Grandfather   . Heart disease Paternal Grandfather   . Hypercholesterolemia Paternal Grandfather   . Stroke Paternal Grandfather   . Colon cancer Paternal Grandfather   . Diabetes Paternal Grandfather   .  Hypertension Sister   . Hypercholesterolemia Sister   . Heart disease Maternal Grandfather   . Diabetes Maternal Grandfather   . Lung cancer Maternal Grandmother   . Diabetes Maternal Grandmother   . Diabetes Paternal Grandmother   . Arthritis    . Brain cancer Mother     BP 129/87 mmHg  Pulse 88  Temp(Src) 98.2 F (36.8 C) (Oral)  Resp 17  Ht 5\' 7"  (1.702 m)  Wt 198 lb 13.7 oz (90.2 kg)  BMI 31.14 kg/m2  SpO2 99%  Physical Exam: stable appearing middle aged man, NAD HEENT: Unremarkable,[place], AT Neck:  6 JVD, no thyromegally Back:  No CVA tenderness Lungs:  Clear with no wheezes, rales, or rhonchi HEART:  Regular rate rhythm, no murmurs, no rubs, no clicks Abd:  soft, positive bowel sounds, no organomegally, no rebound, no guarding Ext:  2 plus pulses, no edema, no cyanosis, no clubbing Skin:  No rashes no nodules Neuro:  CN II through XII intact, motor grossly intact   Labs:   Lab Results  Component Value Date   WBC 11.5* 07/18/2014   HGB 15.8 07/18/2014   HCT 45.4 07/18/2014   MCV 92.8 07/18/2014   PLT 255 07/18/2014     Recent Labs Lab 07/18/14 1929  NA 145  K 4.1  CL 105  CO2 26  BUN 11  CREATININE 0.83  CALCIUM 9.1  PROT 7.1  BILITOT 0.4  ALKPHOS 72  ALT 26  AST 24  GLUCOSE 91    Radiology/Studies: Ct Head Wo Contrast 07/19/2014   CLINICAL DATA:  Syncope  EXAM: CT HEAD WITHOUT CONTRAST  TECHNIQUE: Contiguous axial images were obtained from the base of the skull through the vertex without intravenous contrast.  COMPARISON:  11/22/2010  FINDINGS: No evidence of parenchymal hemorrhage or extra-axial fluid collection. No mass lesion, mass effect, or midline shift.  No CT evidence of acute infarction.  Cerebral volume is within normal limits.  No ventriculomegaly.  The visualized paranasal sinuses are essentially clear. The mastoid air cells are unopacified.  No evidence of calvarial fracture.  IMPRESSION: No evidence of acute intracranial abnormality.    Electronically Signed   By: Charline BillsSriyesh  Krishnan M.D.   On: 07/19/2014 00:29   Mr Brain Wo Contrast 07/19/2014   CLINICAL DATA:  Chest pain and syncope occurring yesterday. Movement disorder. Initial encounter.  EXAM: MRI HEAD WITHOUT CONTRAST  TECHNIQUE: Multiplanar, multiecho pulse sequences of the brain and surrounding structures were obtained without intravenous contrast.  COMPARISON:  CT head with 07/19/2014.  MR head 11/22/2010.  FINDINGS: No evidence for acute infarction, hemorrhage, mass lesion, hydrocephalus, or extra-axial fluid. Normal cerebral volume. Slight asymmetric sulcal prominence over the RIGHT frontal region is stable from 2012.  A few minor subcortical white matter lesions are noted, non worrisome. Flow voids are maintained throughout the carotid, basilar, and vertebral arteries. There are no areas of chronic hemorrhage.  Pituitary, pineal, and cerebellar tonsils unremarkable. No upper cervical lesions. Visualized calvarium, skull base, and upper cervical osseous structures unremarkable. Scalp and extracranial soft tissues, orbits, sinuses, and mastoids show no acute process.  Compared with 2012, similar appearance.  IMPRESSION: Unremarkable cranial MRI. No acute abnormality is detected. Stable slight sulcal asymmetry of the RIGHT frontal lobe, non worrisome.   Electronically Signed   By: Davonna Belling M.D.   On: 07/19/2014 16:09   Dg Chest Portable 1 View 07/18/2014   CLINICAL DATA:  Shortness of breath, chest pain, nausea x2 days  EXAM: PORTABLE CHEST - 1 VIEW  COMPARISON:  05/01/2014  FINDINGS: Lungs are clear.  No pleural effusion or pneumothorax.  Cardiomegaly.  IMPRESSION: No evidence of acute cardiopulmonary disease.   Electronically Signed   By: Charline Bills M.D.   On: 07/18/2014 20:28    EKG: sinus rhythm, rate 93, normal intervals  TELEMETRY: sinus rhythm, occasional PVC's  A/P 1. Unexplained syncope 2. Parkinson's disease Rec: I have discussed the treatment options with  the patient. While many patient's with Parkinson's disease will develop syncope due to autonomic dysfunction, he has had no evidence for this while in the hospital with negative orthostatic vitals. We discussed both watchful waiting as well as insertion of an ILR and he very much prefers to proceed with ILR stating " I want to rule out all causes of my passing out".   Leonia Reeves.D.

## 2014-07-20 NOTE — Progress Notes (Signed)
*  PRELIMINARY RESULTS* Vascular Ultrasound Carotid Duplex (Doppler) has been completed.  Preliminary findings: Bilateral:  1-39% ICA stenosis.  Vertebral artery flow is antegrade.      Farrel DemarkJill Eunice, RDMS, RVT  07/20/2014, 11:21 AM

## 2014-07-20 NOTE — Procedures (Signed)
EEG report.  Brief clinical history:  43y/o gentleman with history CAD, DM, RLS and self reported history of Parkinson's disease presenting with recurrent episodes of loss of consciousness. Unclear etiology.   Technique: this is a 17 channel routine scalp EEG performed at the bedside with bipolar and monopolar montages arranged in accordance to the international 10/20 system of electrode placement. One channel was dedicated to EKG recording.  The study was performed during wakefulness, drowsiness, and stage 2 sleep. Intermittent photic stimulation was utilized as activating procedure.  Description:In the wakeful state, the best background consisted of a medium amplitude, posterior dominant, well sustained, symmetric and reactive 10 Hz rhythm.  Drowsiness demonstrated dropout of the alpha rhythm. Stage 2 sleep showed symmetric and synchronous sleep spindles without intermixed epileptiform discharges. Intermittent photic stimulation did induce a normal driving response.  No focal or generalized epileptiform discharges noted.  No pathologic areas of slowing seen.  EKG showed sinus rhythm.  Impression: this is a normal awake and asleep EEG. Please, be aware that a normal EEG does not exclude the possibility of epilepsy.  Clinical correlation is advised.   Wyatt Portelasvaldo Silverio Hagan, MD

## 2014-07-20 NOTE — CV Procedure (Signed)
EP Procedure Note  Procedure: Insertion of an ILR  Indication: unexplained syncope  Pre-procedure Diagnosis: unexplained syncope  Post-procedure Diagnosis: Same as preprocedure diagnosis  Description of the procedure: After informed consent was obtained, the patient was prepped and draped in a sterile fashion. 20 cc of lidocaine was infiltrated and a one cm stab incision made. The Medtronic ILR, serial [id number] was inserted into the subcutaneous space. R waves measured 0.5 mV. Benzoin and steristrips were painted on the skin and the patient recovered in the usual manner.   Complications: none immediately.  Conclusion: successful insertion of a Medtronic ILR in a patient with unexplained syncope.  Samuel Williams Dayshaun Whobrey,M.D.

## 2014-07-20 NOTE — Discharge Summary (Signed)
Physician Discharge Summary  Samuel Williams [name] DOB: 1971-07-29 DOA: 07/18/2014  PCP: Samuel Williams  Admit date: 07/18/2014 Discharge date: 07/20/2014  Time spent: 30 minutes  Recommendations for Outpatient Follow-up:  1. D/c home with PCP follow up in 1 week. Please follow 2-D echo done this admission. 2.  arranged follow up with neurologist Samuel Williams in 2 weeks  Discharge Diagnoses:  Principle problem:  syncope and collapse  Active Problems:   Essential hypertension   Hyperlipidemia   Syncope and collapse   Pain in the chest   Parkinson disease   ETOH abuse   Essential hypertension, benign   Discharge Condition: fair  Diet recommendation: heart healthy  Filed Weights   07/18/14 1934 07/18/14 2308  Weight: 87.091 kg (192 lb) 90.2 kg (198 lb 13.7 oz)    History of present illness:    Hospital course Syncope vasovagal vs neurogenic Alcohol and high-dose primidone would also contributed to the symptoms. Stable on tele Serial troponin negative. Reports having these symptoms for past several years. Had one episode earlier this year. orthostasis negative. doesnot recall the events. No witnessed seizures. Seen by cardiology. Patient underwent implantable loop recorder today. Has been stable on telemetry Neurology consulted. MRI of there brain was negative for acute activity. EEG was negative for seizure activity  Essential Hypertension Not on medications. BP elevated. Will discharge on low dose amlodipine and follow up as outpt. instructed on low salt diet.  Chest pain  Seems atypical. Has has 3 cardiac caths , most recent one earlier this years negative for ischemia 2D echo done and results should be followed as outpt. continue ASA  ?Parkinson's disease It is unclear what his underlying movement disorder is. Patient reports being diagnosed of Parkinson's and being on primidone for this.He reports [facility].  As per neurology  this is not a standard medication for Parkinson's and more commonly used for essential tremor.  I have scheduled appointment with neurologist Samuel Williams in 2 weeks to evaluate him and adjust/changes medications    hypperlipidemia continue statin   alcohol use Patient had 6-7 beers prior to these symptoms and could be contributing to them as well. Counseled on cessation.   Patient is clinically stable and wishes to go home. I think his cleared medically to be discharged and has a follow-up with neurologist in 2 weeks.  Code Status: full code Family Communication: son at bedside Disposition Plan: home with outpatient follow-up   Consultants:  cardiology  neurology  Procedures:  none  Antibiotics:  none  Discharge Exam: Filed Vitals:   07/20/14 1758  BP: 152/94  Pulse: 101  Temp:   Resp:     General: Middle aged male in no acute distress HEENT: No pallor, moist oral mucosa Chest: Clear to condition bilaterally CVS: Normal S1 and S2, no murmurs  abdomen: Soft, nondistended, nontender Extremities: Warm, no edema  CNS: Alert and oriented, nonfocal  Discharge Instructions You were cared for by a hospitalist during your hospital stay. If you have any questions about your discharge medications or the care you received while you were in the hospital after you are discharged, you can call the unit and asked to speak with the hospitalist on call if the hospitalist that took care of you is not available. Once you are discharged, your primary care physician will handle any further medical issues. Please note that NO REFILLS for any discharge medications will be authorized once you are discharged, as it is imperative  that you return to your primary care physician (or establish a relationship with a primary care physician if you do not have one) for your aftercare needs so that they can reassess your need for medications and monitor your lab values.   Current Discharge Medication  List    START taking these medications   Details  amLODipine (NORVASC) 5 MG tablet Take 1 tablet (5 mg total) by mouth daily. Qty: 30 tablet, Refills: 0      CONTINUE these medications which have NOT CHANGED   Details  aspirin 81 MG tablet Take 81 mg by mouth daily.    cyclobenzaprine (FLEXERIL) 5 MG tablet Take 5 mg by mouth 3 (three) times daily as needed for muscle spasms.    EPINEPHrine (EPIPEN 2-PAK) 0.3 mg/0.3 mL SOAJ injection Inject 0.3 mg into the muscle as needed (anaphylaxis).     nitroGLYCERIN (NITROSTAT) 0.4 MG SL tablet Place 0.4 mg under the tongue every 5 (five) minutes as needed for chest pain.    pravastatin (PRAVACHOL) 40 MG tablet Take 40 mg by mouth daily.    primidone (MYSOLINE) 50 MG tablet Take 300 mg by mouth 2 (two) times daily.        Allergies  Allergen Reactions  . Bee Venom Anaphylaxis  . Mushroom Extract Complex Anaphylaxis    RAW ONLY   Follow-up Information    Follow up with Williams, REBECCA, DO In 2 weeks.   Specialty:  Neurology   Why:  Patient needs follow up Appt. with SamuelTat within 2wks of Discharge. Appt. is set for    Contact information:   [address] [place]  [address] [place] [address] [phone]       Follow up with Williams, REBECCA, DO. Go on 08/03/2014.   Specialty:  Neurology   Why:  1 pm. Please arrive 15 mins early    Contact information:   [address] [place]  [address] [place] [address] [phone]       Follow up with [facility], Williams. Schedule an appointment as soon as possible for a visit in 1 week.   Specialty:  Family Medicine   Contact information:   Samuel. Burnell Williams [address] [place] [address] [phone]        The results of significant diagnostics from this hospitalization (including imaging, microbiology, ancillary and laboratory) are listed below for reference.    Significant Diagnostic Studies: Ct Head Wo Contrast  07/19/2014   CLINICAL DATA:  Syncope  EXAM: CT  HEAD WITHOUT CONTRAST  TECHNIQUE: Contiguous axial images were obtained from the base of the skull through the vertex without intravenous contrast.  COMPARISON:  11/22/2010  FINDINGS: No evidence of parenchymal hemorrhage or extra-axial fluid collection. No mass lesion, mass effect, or midline shift.  No CT evidence of acute infarction.  Cerebral volume is within normal limits.  No ventriculomegaly.  The visualized paranasal sinuses are essentially clear. The mastoid air cells are unopacified.  No evidence of calvarial fracture.  IMPRESSION: No evidence of acute intracranial abnormality.   Electronically Signed   By: Charline Bills M.D.   On: 07/19/2014 00:29   Mr Brain Wo Contrast  07/19/2014   CLINICAL DATA:  Chest pain and syncope occurring yesterday. Movement disorder. Initial encounter.  EXAM: MRI HEAD WITHOUT CONTRAST  TECHNIQUE: Multiplanar, multiecho pulse sequences of the brain and surrounding structures were obtained without intravenous contrast.  COMPARISON:  CT head with 07/19/2014.  MR head 11/22/2010.  FINDINGS: No evidence for  acute infarction, hemorrhage, mass lesion, hydrocephalus, or extra-axial fluid. Normal cerebral volume. Slight asymmetric sulcal prominence over the RIGHT frontal region is stable from 2012.  A few minor subcortical white matter lesions are noted, non worrisome. Flow voids are maintained throughout the carotid, basilar, and vertebral arteries. There are no areas of chronic hemorrhage.  Pituitary, pineal, and cerebellar tonsils unremarkable. No upper cervical lesions. Visualized calvarium, skull base, and upper cervical osseous structures unremarkable. Scalp and extracranial soft tissues, orbits, sinuses, and mastoids show no acute process.  Compared with 2012, similar appearance.  IMPRESSION: Unremarkable cranial MRI. No acute abnormality is detected. Stable slight sulcal asymmetry of the RIGHT frontal lobe, non worrisome.   Electronically Signed   By: Davonna BellingJohn  Curnes M.D.    On: 07/19/2014 16:09   Dg Chest Portable 1 View  07/18/2014   CLINICAL DATA:  Shortness of breath, chest pain, nausea x2 days  EXAM: PORTABLE CHEST - 1 VIEW  COMPARISON:  05/01/2014  FINDINGS: Lungs are clear.  No pleural effusion or pneumothorax.  Cardiomegaly.  IMPRESSION: No evidence of acute cardiopulmonary disease.   Electronically Signed   By: Charline BillsSriyesh  Krishnan M.D.   On: 07/18/2014 20:28    Microbiology: No results found for this or any previous visit (from the past 240 hour(s)).   Labs: Basic Metabolic Panel:  Recent Labs Lab 07/18/14 1929 07/20/14 1656  NA 145  --   K 4.1  --   CL 105  --   CO2 26  --   GLUCOSE 91  --   BUN 11  --   CREATININE 0.83 0.81  CALCIUM 9.1  --    Liver Function Tests:  Recent Labs Lab 07/18/14 1929  AST 24  ALT 26  ALKPHOS 72  BILITOT 0.4  PROT 7.1  ALBUMIN 4.0   No results for input(s): LIPASE, AMYLASE in the last 168 hours. No results for input(s): AMMONIA in the last 168 hours. CBC:  Recent Labs Lab 07/18/14 1929 07/20/14 1656  WBC 11.5* 9.6  HGB 15.8 15.8  HCT 45.4 44.8  MCV 92.8 91.6  PLT 255 288   Cardiac Enzymes:  Recent Labs Lab 07/18/14 1929 07/18/14 2227 07/19/14 0430 07/19/14 1003  TROPONINI <0.30 <0.30 <0.30 <0.30   BNP: BNP (last 3 results)  Recent Labs  07/18/14 1933  PROBNP 43.5   CBG: No results for input(s): GLUCAP in the last 168 hours.     SignedEddie North:  Jessie Schrieber  [facility] 07/20/2014, 6:24 PM

## 2014-07-20 NOTE — Progress Notes (Signed)
UR completed 

## 2014-07-20 NOTE — Discharge Instructions (Signed)
Syncope °Syncope means a person passes out (faints). The person usually wakes up in less than 5 minutes. It is important to seek medical care for syncope. °HOME CARE °· Have someone stay with you until you feel normal. °· Do not drive, use machines, or play sports until your doctor says it is okay. °· Keep all doctor visits as told. °· Lie down when you feel like you might pass out. Take deep breaths. Wait until you feel normal before standing up. °· Drink enough fluids to keep your pee (urine) clear or pale yellow. °· If you take blood pressure or heart medicine, get up slowly. Take several minutes to sit and then stand. °GET HELP RIGHT AWAY IF:  °· You have a severe headache. °· You have pain in the chest, belly (abdomen), or back. °· You are bleeding from the mouth or butt (rectum). °· You have black or tarry poop (stool). °· You have an irregular or very fast heartbeat. °· You have pain with breathing. °· You keep passing out, or you have shaking (seizures) when you pass out. °· You pass out when sitting or lying down. °· You feel confused. °· You have trouble walking. °· You have severe weakness. °· You have vision problems. °If you fainted, call your local emergency services (911 in [place] Do not drive yourself to the hospital. °MAKE SURE YOU:  °· Understand these instructions. °· Will watch your condition. °· Will get help right away if you are not doing well or get worse. °Document Released: 01/17/2008 Document Revised: 01/30/2012 Document Reviewed: 09/29/2011 °ExitCare® Patient Information ©2015 [employer]. This information is not intended to replace advice given to you by your health care provider. Make sure you discuss any questions you have with your health care provider. ° °

## 2014-07-20 NOTE — Progress Notes (Signed)
Subjective: No further episodes while in hospital.  He feels back to his baseline at this time.  He states he has been given a lower dose of Primidone while in hospital but has not been having increased tremor or SE.  He prefers to stay on this dose.  [place] would [place] in [place].   Objective: Current vital signs: BP 129/87 mmHg  Pulse 88  Temp(Src) 98.2 F (36.8 C) (Oral)  Resp 17  Ht 5\' 7"  (1.702 m)  Wt 90.2 kg (198 lb 13.7 oz)  BMI 31.14 kg/m2  SpO2 99% Vital signs in last 24 hours: Temp:  [98.1 F (36.7 C)-98.6 F (37 C)] 98.2 F (36.8 C) (12/07 0500) Pulse Rate:  [75-94] 88 (12/07 0500) Resp:  [17-18] 17 (12/07 0500) BP: (129-165)/(85-90) 129/87 mmHg (12/07 0500) SpO2:  [98 %-100 %] 99 % (12/07 0500)  Intake/Output from previous day: 12/06 0701 - 12/07 0700 In: 480 [P.O.:480] Out: -  Intake/Output this shift: Total I/O In: 240 [P.O.:240] Out: -  Nutritional status: Diet Heart  Neurologic Exam:  Mental Status: Alert, oriented, thought content appropriate.  Speech fluent without evidence of aphasia.  Able to follow 3 step commands without difficulty. Cranial Nerves: II: Discs flat bilaterally; Visual fields grossly normal, pupils equal, round, reactive to light and accommodation III,IV, VI: ptosis not present, extra-ocular motions intact bilaterally V,VII: smile symmetric, facial light touch sensation normal bilaterally VIII: hearing normal bilaterally IX,X: gag reflex present XI: bilateral shoulder shrug XII: midline tongue extension without atrophy or fasciculations  Motor: Right : Upper extremity   5/5    Left:     Upper extremity   5/5  Lower extremity   5/5     Lower extremity   5/5 Tone and bulk:normal tone throughout; no atrophy noted Sensory: Pinprick and light touch intact throughout, bilaterally Deep Tendon Reflexes:  Right: Upper Extremity   Left: Upper extremity   biceps (C-5 to C-6) 2/4   biceps (C-5 to C-6) 2/4 tricep (C7)  2/4    triceps (C7) 2/4 Brachioradialis (C6) 2/4  Brachioradialis (C6) 2/4  Lower Extremity Lower Extremity  quadriceps (L-2 to L-4) 2/4   quadriceps (L-2 to L-4) 2/4 Achilles (S1) 2/4   Achilles (S1) 2/4  Plantars: Right: downgoing   Left: downgoing Cerebellar: normal finger-to-nose,  normal heel-to-shin test    Lab Results: Basic Metabolic Panel:  Recent Labs Lab 07/18/14 1929  NA 145  K 4.1  CL 105  CO2 26  GLUCOSE 91  BUN 11  CREATININE 0.83  CALCIUM 9.1    Liver Function Tests:  Recent Labs Lab 07/18/14 1929  AST 24  ALT 26  ALKPHOS 72  BILITOT 0.4  PROT 7.1  ALBUMIN 4.0   No results for input(s): LIPASE, AMYLASE in the last 168 hours. No results for input(s): AMMONIA in the last 168 hours.  CBC:  Recent Labs Lab 07/18/14 1929  WBC 11.5*  HGB 15.8  HCT 45.4  MCV 92.8  PLT 255    Cardiac Enzymes:  Recent Labs Lab 07/18/14 1929 07/18/14 2227 07/19/14 0430 07/19/14 1003  TROPONINI <0.30 <0.30 <0.30 <0.30    Lipid Panel:  Recent Labs Lab 07/19/14 0430  CHOL 224*  TRIG 332*  HDL 41  CHOLHDL 5.5  VLDL 66*  LDLCALC 117*    CBG: No results for input(s): GLUCAP in the last 168 hours.  Microbiology: No results found for this or any previous visit.  Coagulation Studies: No results for input(s):  LABPROT, INR in the last 72 hours.  Imaging: Ct Head Wo Contrast  07/19/2014   CLINICAL DATA:  Syncope  EXAM: CT HEAD WITHOUT CONTRAST  TECHNIQUE: Contiguous axial images were obtained from the base of the skull through the vertex without intravenous contrast.  COMPARISON:  11/22/2010  FINDINGS: No evidence of parenchymal hemorrhage or extra-axial fluid collection. No mass lesion, mass effect, or midline shift.  No CT evidence of acute infarction.  Cerebral volume is within normal limits.  No ventriculomegaly.  The visualized paranasal sinuses are essentially clear. The mastoid air cells are unopacified.  No evidence of calvarial fracture.   IMPRESSION: No evidence of acute intracranial abnormality.   Electronically Signed   By: Charline BillsSriyesh  Krishnan M.D.   On: 07/19/2014 00:29   Mr Brain Wo Contrast  07/19/2014   CLINICAL DATA:  Chest pain and syncope occurring yesterday. Movement disorder. Initial encounter.  EXAM: MRI HEAD WITHOUT CONTRAST  TECHNIQUE: Multiplanar, multiecho pulse sequences of the brain and surrounding structures were obtained without intravenous contrast.  COMPARISON:  CT head with 07/19/2014.  MR head 11/22/2010.  FINDINGS: No evidence for acute infarction, hemorrhage, mass lesion, hydrocephalus, or extra-axial fluid. Normal cerebral volume. Slight asymmetric sulcal prominence over the RIGHT frontal region is stable from 2012.  A few minor subcortical white matter lesions are noted, non worrisome. Flow voids are maintained throughout the carotid, basilar, and vertebral arteries. There are no areas of chronic hemorrhage.  Pituitary, pineal, and cerebellar tonsils unremarkable. No upper cervical lesions. Visualized calvarium, skull base, and upper cervical osseous structures unremarkable. Scalp and extracranial soft tissues, orbits, sinuses, and mastoids show no acute process.  Compared with 2012, similar appearance.  IMPRESSION: Unremarkable cranial MRI. No acute abnormality is detected. Stable slight sulcal asymmetry of the RIGHT frontal lobe, non worrisome.   Electronically Signed   By: Davonna BellingJohn  Curnes M.D.   On: 07/19/2014 16:09   Dg Chest Portable 1 View  07/18/2014   CLINICAL DATA:  Shortness of breath, chest pain, nausea x2 days  EXAM: PORTABLE CHEST - 1 VIEW  COMPARISON:  05/01/2014  FINDINGS: Lungs are clear.  No pleural effusion or pneumothorax.  Cardiomegaly.  IMPRESSION: No evidence of acute cardiopulmonary disease.   Electronically Signed   By: Charline BillsSriyesh  Krishnan M.D.   On: 07/18/2014 20:28    Medications:  Scheduled: . aspirin  81 mg Oral Daily  . enoxaparin (LOVENOX) injection  40 mg Subcutaneous Q24H  .  pravastatin  40 mg Oral Daily  . primidone  300 mg Oral BID    Assessment/Plan:  43y/o gentleman with history CAD, DM, RLS and self reported history of Parkinson's disease presenting with recurrent episodes of loss of consciousness. Unclear etiology. Differential would be seizure vs syncope. Duration of LOC is more consistent with seizure but history is atypical for this. EtOH intake coupled with high dose of primidone likely played a role in his symptoms.   Unclear what patients underlying movement disorder is. He reports he was diagnosed with Parkinson's and takes primidone for this. Primidone is not a standard PD medication and is more commonly used for essential tremor.   Currently awaiting EEG and Carotid doppler.   Cardiology recommending loop recorder.   Will continue to follow.   Felicie MornDavid Caige Almeda PA-C [facility] [phone]  07/20/2014, 10:17 AM

## 2014-07-20 NOTE — Evaluation (Signed)
Physical Therapy Evaluation Patient Details Name: Samuel Williams MRN: [id number] DOB: August 07, 1971 Today's Date: 07/20/2014   History of Present Illness  43 y.o. male presenting for recurrent episodes of loss of consciousness with question of seizure vs syncope. Notes working in the woods, drank 5-6 beers, stood up and passed out. Stood up and passed out again. Started walking towards house and passed out again. Each episode lasted around 10minutes. No noted abnormal movements. Son reports patient did hit his head during a fall. Denies any aura or symptoms prior to LOC. Son reports minimal confusion after the event. Had cardiac cath in March 2015 which was unremarkable. Of note patient has diagnosis of Parkinson's disease states [facility]. Reports being on primidone? No notes available in care everywhere.   Clinical Impression  MD had just left room and reports pt to get loop recorder placed this afternoon but ok for PT evaluate at this time.  Pt functioning at independent level (mod I for stairs due to use of rail) without AD and no episodes or reports of instability, weakness, or syncopal episodes since admission. Pt reports he feels his normal. No further PT services recommended at this time due to pt's independence with functional mobility. Pt in agreement. Pt is clear from PT standpoint to d/c home with family.   If further PT needs arise, please let us know.     Follow Up Recommendations No PT follow up    Equipment Recommendations  None recommended by PT    Recommendations for Other Services       Precautions / Restrictions Precautions Precautions: None Restrictions Weight Bearing Restrictions: No      Mobility  Bed Mobility Overal bed mobility: Modified Independent (used bed rails but would not need them)             General bed mobility comments: Pt indendependent with bed mobility including climbing back into bed head first using knee to get  onto the bed.  Transfers Overall transfer level: Independent Equipment used: None             General transfer comment: independent to mod I for transfers (used arms to push up from bed initially)  Ambulation/Gait Ambulation/Gait assistance: Independent Ambulation Distance (Feet): 500 Feet Assistive device: None Gait Pattern/deviations: WFL(Within Functional Limits)     General Gait Details: Did not note any specific gait abnormalities or balance/coordination deficits. Gait speed was [facility] and pt denied any feelings of LOB or abnormality since admission.   Stairs Stairs: Yes Stairs assistance: Modified independent (Device/Increase time) Stair Management: One rail Right;One rail Left;Alternating pattern Number of Stairs: 6 General stair comments: pt used 1 rail to ascend and then to descend stairs in stairwell. No difficulties noted.  Wheelchair Mobility    Modified Rankin (Stroke Patients Only)       Balance Overall balance assessment: Modified Independent (Pt donned pants in standing in single limb stance )                                           Pertinent Vitals/Pain Pain Assessment: No/denies pain    Home Living Family/patient expects to be discharged to:: Private residence Living Arrangements: Spouse/significant other Available Help at Discharge: Family Type of Home: House Home Access: Ramped entrance     Home Layout: One level Home Equipment: None      Prior  Function Level of Independence: Independent               Hand Dominance        Extremity/Trunk Assessment   Upper Extremity Assessment: Overall WFL for tasks assessed           Lower Extremity Assessment: Overall WFL for tasks assessed      Cervical / Trunk Assessment: Normal  Communication   Communication: No difficulties  Cognition Arousal/Alertness: Awake/alert Behavior During Therapy: WFL for tasks assessed/performed Overall Cognitive Status: Within  Functional Limits for tasks assessed                      General Comments General comments (skin integrity, edema, etc.): Overall pt is independent with mobility and denies any changes in mobility since admission.     Exercises        Assessment/Plan    PT Assessment Patent does not need any further PT services  PT Diagnosis  (None)   PT Problem List    PT Treatment Interventions     PT Goals (Current goals can be found in the Care Plan section) Acute Rehab PT Goals Patient Stated Goal: "leave today after they put the loop thing in" PT Goal Formulation: With patient    Frequency     Barriers to discharge        Co-evaluation               End of Session   Activity Tolerance: Patient tolerated treatment well Patient left: in bed;with call bell/phone within reach;with family/visitor present Nurse Communication: Mobility status    Functional Assessment Tool Used: clinical judgement Functional Limitation: Mobility: Walking and moving around Mobility: Walking and Moving Around Current Status ([id number](G8978): 0 percent impaired, limited or restricted Mobility: Walking and Moving Around Goal Status [phone](G8979): 0 percent impaired, limited or restricted Mobility: Walking and Moving Around Discharge Status [phone](G8980): 0 percent impaired, limited or restricted    Time: 1410-1420 PT Time Calculation (min) (ACUTE ONLY): 10 min   Charges:   PT Evaluation $Initial PT Evaluation Tier I: 1 Procedure     PT G Codes:   Functional Assessment Tool Used: clinical judgement Functional Limitation: Mobility: Walking and moving around    Tedd SiasGray, Bryonna Sundby B[place] 07/20/2014, 2:37 PM

## 2014-07-20 NOTE — Progress Notes (Signed)
EEG Completed; Results Pending  

## 2014-07-23 ENCOUNTER — Encounter (HOSPITAL_COMMUNITY): Payer: Self-pay | Admitting: Cardiology

## 2014-07-27 ENCOUNTER — Encounter: Payer: Self-pay | Admitting: Neurology

## 2014-07-30 ENCOUNTER — Ambulatory Visit: Payer: Medicaid Other

## 2014-08-03 ENCOUNTER — Ambulatory Visit: Payer: Medicaid Other | Admitting: Neurology

## 2014-08-04 ENCOUNTER — Telehealth: Payer: Self-pay | Admitting: Neurology

## 2014-08-04 NOTE — Telephone Encounter (Signed)
Pt no showed 08/03/14 appt w/ Dr. Arbutus Leasat. Appt was verbally confirmed with the patient during reminder calls.  Alcario DroughtErica - please send no show letter to pt / Sherri S.

## 2014-08-05 ENCOUNTER — Encounter: Payer: Self-pay | Admitting: *Deleted

## 2014-08-05 ENCOUNTER — Ambulatory Visit: Payer: Medicaid Other

## 2014-08-05 NOTE — Telephone Encounter (Signed)
Per Dr. Arbutus Leasat - because patient verbally confirmed np appt and then no showed, we can only reschedule if the referring provider deems necessary, the referring provider or a member of their office staff will need to contact our office if reschedule is desired / Samuel S.

## 2014-08-05 NOTE — Progress Notes (Signed)
No show letter sent for 08/03/2014 

## 2014-08-11 ENCOUNTER — Encounter: Payer: Self-pay | Admitting: Internal Medicine

## 2014-08-13 ENCOUNTER — Ambulatory Visit: Payer: Medicaid Other

## 2014-08-17 ENCOUNTER — Encounter: Payer: Self-pay | Admitting: Internal Medicine

## 2014-08-19 ENCOUNTER — Encounter: Payer: Self-pay | Admitting: *Deleted

## 2014-08-21 ENCOUNTER — Encounter: Payer: Self-pay | Admitting: Internal Medicine

## 2014-09-11 ENCOUNTER — Encounter: Payer: Self-pay | Admitting: *Deleted

## 2014-09-18 ENCOUNTER — Ambulatory Visit (INDEPENDENT_AMBULATORY_CARE_PROVIDER_SITE_OTHER): Payer: Medicaid Other | Admitting: *Deleted

## 2014-09-18 DIAGNOSIS — R55 Syncope and collapse: Secondary | ICD-10-CM

## 2014-09-18 LAB — MDC_IDC_ENUM_SESS_TYPE_REMOTE: Date Time Interrogation Session: 20160216050500

## 2014-09-21 NOTE — Progress Notes (Signed)
Loop recorder 

## 2014-09-29 ENCOUNTER — Telehealth: Payer: Self-pay | Admitting: Cardiology

## 2014-09-29 NOTE — Telephone Encounter (Signed)
Spoke w/ pt and informed him to send manual transmission. Pt verbalized understanding.  

## 2014-10-08 ENCOUNTER — Encounter: Payer: Self-pay | Admitting: *Deleted

## 2014-10-09 ENCOUNTER — Encounter: Payer: Self-pay | Admitting: Cardiology

## 2014-10-16 ENCOUNTER — Encounter: Payer: Self-pay | Admitting: Cardiology

## 2014-10-19 ENCOUNTER — Encounter: Payer: Self-pay | Admitting: Internal Medicine

## 2014-10-19 ENCOUNTER — Ambulatory Visit (INDEPENDENT_AMBULATORY_CARE_PROVIDER_SITE_OTHER): Payer: Medicaid Other | Admitting: *Deleted

## 2014-10-19 DIAGNOSIS — R55 Syncope and collapse: Secondary | ICD-10-CM

## 2014-10-22 NOTE — Progress Notes (Signed)
Loop recorder 

## 2014-11-03 ENCOUNTER — Encounter: Payer: Self-pay | Admitting: Internal Medicine

## 2014-11-03 LAB — MDC_IDC_ENUM_SESS_TYPE_REMOTE
Date Time Interrogation Session: 20160307023135
MDC IDC SET ZONE DETECTION INTERVAL: 2000 ms
MDC IDC SET ZONE DETECTION INTERVAL: 3000 ms
MDC IDC SET ZONE DETECTION INTERVAL: 320 ms

## 2014-11-13 ENCOUNTER — Encounter: Payer: Self-pay | Admitting: Internal Medicine

## 2014-11-13 ENCOUNTER — Encounter: Payer: Self-pay | Admitting: Cardiology

## 2014-11-17 ENCOUNTER — Ambulatory Visit (INDEPENDENT_AMBULATORY_CARE_PROVIDER_SITE_OTHER): Payer: Medicaid Other | Admitting: *Deleted

## 2014-11-17 DIAGNOSIS — R55 Syncope and collapse: Secondary | ICD-10-CM | POA: Diagnosis not present

## 2014-11-20 NOTE — Progress Notes (Signed)
Loop recorder 

## 2014-11-27 ENCOUNTER — Encounter: Payer: Self-pay | Admitting: Internal Medicine

## 2014-12-03 ENCOUNTER — Encounter: Payer: Self-pay | Admitting: Cardiology

## 2014-12-09 LAB — MDC_IDC_ENUM_SESS_TYPE_REMOTE
Date Time Interrogation Session: 20160402203941
MDC IDC SET ZONE DETECTION INTERVAL: 3000 ms
Zone Setting Detection Interval: 2000 ms
Zone Setting Detection Interval: 320 ms

## 2014-12-11 ENCOUNTER — Encounter: Payer: Self-pay | Admitting: Cardiology

## 2014-12-24 ENCOUNTER — Encounter: Payer: Self-pay | Admitting: Cardiology

## 2014-12-31 ENCOUNTER — Encounter: Payer: Self-pay | Admitting: Cardiology

## 2015-01-01 ENCOUNTER — Encounter: Payer: Self-pay | Admitting: Internal Medicine

## 2015-01-07 ENCOUNTER — Encounter: Payer: Self-pay | Admitting: Cardiology

## 2015-01-15 ENCOUNTER — Ambulatory Visit (INDEPENDENT_AMBULATORY_CARE_PROVIDER_SITE_OTHER): Payer: Medicaid Other | Admitting: *Deleted

## 2015-01-15 DIAGNOSIS — R55 Syncope and collapse: Secondary | ICD-10-CM

## 2015-01-19 ENCOUNTER — Encounter: Payer: Self-pay | Admitting: Internal Medicine

## 2015-01-21 NOTE — Progress Notes (Signed)
Loop recorder 

## 2015-01-22 ENCOUNTER — Encounter: Payer: Self-pay | Admitting: Cardiology

## 2015-01-26 LAB — CUP PACEART REMOTE DEVICE CHECK
Date Time Interrogation Session: 20160527221601
MDC IDC SET ZONE DETECTION INTERVAL: 2000 ms
MDC IDC SET ZONE DETECTION INTERVAL: 3000 ms
Zone Setting Detection Interval: 320 ms

## 2015-01-27 ENCOUNTER — Encounter: Payer: Self-pay | Admitting: Cardiology

## 2015-02-03 ENCOUNTER — Encounter: Payer: Self-pay | Admitting: Cardiology

## 2015-02-05 ENCOUNTER — Emergency Department (HOSPITAL_COMMUNITY)
Admission: EM | Admit: 2015-02-05 | Discharge: 2015-02-05 | Disposition: A | Discharge status: Home / Self Care | Payer: Medicaid Other | Attending: Emergency Medicine | Admitting: Emergency Medicine

## 2015-02-05 ENCOUNTER — Encounter: Payer: Self-pay | Admitting: Internal Medicine

## 2015-02-05 ENCOUNTER — Encounter (HOSPITAL_COMMUNITY): Payer: Self-pay | Admitting: *Deleted

## 2015-02-05 DIAGNOSIS — J45909 Unspecified asthma, uncomplicated: Secondary | ICD-10-CM | POA: Diagnosis not present

## 2015-02-05 DIAGNOSIS — G20 Parkinson's disease: Secondary | ICD-10-CM | POA: Diagnosis not present

## 2015-02-05 DIAGNOSIS — Z87891 Personal history of nicotine dependence: Secondary | ICD-10-CM | POA: Insufficient documentation

## 2015-02-05 DIAGNOSIS — F1010 Alcohol abuse, uncomplicated: Secondary | ICD-10-CM | POA: Insufficient documentation

## 2015-02-05 DIAGNOSIS — E119 Type 2 diabetes mellitus without complications: Secondary | ICD-10-CM | POA: Insufficient documentation

## 2015-02-05 DIAGNOSIS — I10 Essential (primary) hypertension: Secondary | ICD-10-CM | POA: Insufficient documentation

## 2015-02-05 DIAGNOSIS — I252 Old myocardial infarction: Secondary | ICD-10-CM | POA: Insufficient documentation

## 2015-02-05 DIAGNOSIS — F32A Depression, unspecified: Secondary | ICD-10-CM

## 2015-02-05 DIAGNOSIS — Z79899 Other long term (current) drug therapy: Secondary | ICD-10-CM | POA: Diagnosis not present

## 2015-02-05 DIAGNOSIS — F329 Major depressive disorder, single episode, unspecified: Secondary | ICD-10-CM | POA: Diagnosis not present

## 2015-02-05 DIAGNOSIS — Z8739 Personal history of other diseases of the musculoskeletal system and connective tissue: Secondary | ICD-10-CM | POA: Insufficient documentation

## 2015-02-05 DIAGNOSIS — Z7982 Long term (current) use of aspirin: Secondary | ICD-10-CM | POA: Insufficient documentation

## 2015-02-05 DIAGNOSIS — E785 Hyperlipidemia, unspecified: Secondary | ICD-10-CM | POA: Diagnosis not present

## 2015-02-05 DIAGNOSIS — Z008 Encounter for other general examination: Secondary | ICD-10-CM | POA: Diagnosis present

## 2015-02-05 HISTORY — DX: Post-traumatic stress disorder, unspecified: F43.10

## 2015-02-05 HISTORY — DX: Major depressive disorder, single episode, unspecified: F32.9

## 2015-02-05 HISTORY — DX: Bipolar disorder, unspecified: F31.9

## 2015-02-05 LAB — COMPREHENSIVE METABOLIC PANEL
ALT: 84 U/L — ABNORMAL HIGH (ref 17–63)
ANION GAP: 12 (ref 5–15)
AST: 52 U/L — ABNORMAL HIGH (ref 15–41)
Albumin: 4.2 g/dL (ref 3.5–5.0)
Alkaline Phosphatase: 63 U/L (ref 38–126)
BUN: 11 mg/dL (ref 6–20)
CHLORIDE: 102 mmol/L (ref 101–111)
CO2: 24 mmol/L (ref 22–32)
Calcium: 9.1 mg/dL (ref 8.9–10.3)
Creatinine, Ser: 1.2 mg/dL (ref 0.61–1.24)
GFR calc non Af Amer: >60 mL/min (ref >60)
GLUCOSE: 98 mg/dL (ref 65–99)
Potassium: 3.9 mmol/L (ref 3.5–5.1)
Sodium: 138 mmol/L (ref 135–145)
TOTAL PROTEIN: 7 g/dL (ref 6.5–8.1)
Total Bilirubin: 0.7 mg/dL (ref 0.3–1.2)

## 2015-02-05 LAB — RAPID URINE DRUG SCREEN, HOSP PERFORMED
AMPHETAMINES: NOT DETECTED
Barbiturates: NOT DETECTED
Benzodiazepines: NOT DETECTED
Cocaine: NOT DETECTED
OPIATES: NOT DETECTED
TETRAHYDROCANNABINOL: NOT DETECTED

## 2015-02-05 LAB — CBC
HEMATOCRIT: 46.1 % (ref 39.0–52.0)
Hemoglobin: 16.5 g/dL (ref 13.0–17.0)
MCH: 33 pg (ref 26.0–34.0)
MCHC: 35.8 g/dL (ref 30.0–36.0)
MCV: 92.2 fL (ref 78.0–100.0)
Platelets: 274 10*3/uL (ref 150–400)
RBC: 5 MIL/uL (ref 4.22–5.81)
RDW: 12.9 % (ref 11.5–15.5)
WBC: 10.5 10*3/uL (ref 4.0–10.5)

## 2015-02-05 LAB — ACETAMINOPHEN LEVEL: Acetaminophen (Tylenol), Serum: <10 ug/mL — ABNORMAL LOW (ref 10–30)

## 2015-02-05 LAB — ETHANOL: Alcohol, Ethyl (B): 156 mg/dL — ABNORMAL HIGH (ref <5)

## 2015-02-05 LAB — SALICYLATE LEVEL

## 2015-02-05 NOTE — ED Notes (Signed)
MD at bedside. 

## 2015-02-05 NOTE — ED Notes (Addendum)
Pt states he is no longer SI; Pt states it was just a thought that crossed his mind ealier but he was able to talk to fiance an come up a plan for recovery; Pt states he has SI attempt 17--20 years go by putting a gun in his mouth; pt states he had 5 bullets in the chamber and shot fives times but nothing happen; pt states he really would like to go home and has no access to a gun at this time; Pt is very calm and pleasant; Denies SI at this time. Fiance is supportive and at bedside

## 2015-02-05 NOTE — ED Notes (Signed)
PA at bedside.

## 2015-02-05 NOTE — ED Provider Notes (Signed)
CSN: [id number]     Arrival date & time 02/05/15  2038 History   First MD Initiated Contact with Patient 02/05/15 2221     Chief Complaint  Patient presents with  . Psychiatric Evaluation     (Consider location/radiation/quality/duration/timing/severity/associated sxs/prior Treatment) HPI   Pt with hx bipolar disorder, PTSD, alcoholism, p/w depression thoughts of suicide without specific plan after drinking alcohol today.  At time of interview, pt reports he feels much better, he has no thoughts of suicide.  He states he has had a lot of stress recently and has perhaps lost touch with himself and has not been doing his meditation that usually helps him control his bipolar disorder. He also has a problem with alcoholism and knows he needs help with this.  He states he does not notice any withdrawal symptoms when he doesn't drink but his fiance states he gets very grumpy.  Pt has multiple medical problems but he denies any physical symptoms today.    Past Medical History  Diagnosis Date  . Restless legs syndrome (RLS)   . Obstructive sleep apnea (adult) (pediatric)   . Pain in limb   . 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 Hyperlipidemia   . Other and unspecified hyperlipidemia   . Backache, unspecified   . Unspecified essential hypertension   . Unspecified vitamin D deficiency   . Unspecified asthma(493.90)   . MI (myocardial infarction) x5    per report, normal catheterization 2015  . Diabetes mellitus without complication   . Parkinson disease   . PTSD (post-traumatic stress disorder)   . Bipolar 1 disorder   . Major depression    Past Surgical History  Procedure Laterality Date  . Cholecystectomy    . Cardiac catheterization  2015    non obstructive CAD, normal LV function  . Left heart catheterization with coronary angiogram N/A 10/28/2013    Procedure: LEFT HEART CATHETERIZATION WITH CORONARY ANGIOGRAM;  Surgeon: Peter M Swaziland, MD;   Location: [facility] CATH LAB;  Service: Cardiovascular;  Laterality: N/A;  . Loop recorder implant N/A 07/20/2014    Procedure: LOOP RECORDER IMPLANT;  Surgeon: Marinus Maw, MD;  Location: [facility] CATH LAB;  Service: Cardiovascular;  Laterality: N/A;  . Knee surgery     Family History  Problem Relation Age of Onset  . Hypertension Father   . Heart disease Father   . Hypercholesterolemia Father   . Thyroid disease Father   . Stroke Father   . Cancer Father   . Hypertension Paternal Grandfather   . Heart disease Paternal Grandfather   . Hypercholesterolemia Paternal Grandfather   . Stroke Paternal Grandfather   . Colon cancer Paternal Grandfather   . Diabetes Paternal Grandfather   . Hypertension Sister   . Hypercholesterolemia Sister   . Heart disease Maternal Grandfather   . Diabetes Maternal Grandfather   . Lung cancer Maternal Grandmother   . Diabetes Maternal Grandmother   . Diabetes Paternal Grandmother   . Arthritis    . Brain cancer Mother    History  Substance Use Topics  . Smoking status: Former Games developer  . Smokeless tobacco: Current User    Types: Chew  . Alcohol Use: Yes     Comment: daily 12-24 beers    Review of Systems  All other systems reviewed and are negative.     Allergies  Bee venom; Mushroom extract complex; and Ambien  Home Medications   Prior to Admission medications   Medication Sig Start  Date End Date Taking? Authorizing Provider  aspirin 81 MG tablet Take 81 mg by mouth daily.   Yes Historical Provider, MD  BusPIRone HCl (BUSPAR PO) Take by mouth.   Yes Historical Provider, MD  cyclobenzaprine (FLEXERIL) 5 MG tablet Take 5 mg by mouth 3 (three) times daily as needed for muscle spasms.   Yes Historical Provider, MD  EPINEPHrine (EPIPEN 2-PAK) 0.3 mg/0.3 mL SOAJ injection Inject 0.3 mg into the muscle as needed (anaphylaxis).    Yes Historical Provider, MD  nitroGLYCERIN (NITROSTAT) 0.4 MG SL tablet Place 0.4 mg under the tongue every 5 (five) minutes  as needed for chest pain.   Yes Historical Provider, MD  pravastatin (PRAVACHOL) 40 MG tablet Take 40 mg by mouth daily.   Yes Historical Provider, MD  primidone (MYSOLINE) 50 MG tablet Take 150 mg by mouth 3 (three) times daily.    Yes Historical Provider, MD  amLODipine (NORVASC) 5 MG tablet Take 1 tablet (5 mg total) by mouth daily. Patient not taking: Reported on 02/05/2015 07/20/14   Nishant Dhungel, MD   BP 153/96 mmHg  Pulse 89  Temp(Src) 98.1 F (36.7 C) (Oral)  Resp 18  SpO2 94% Physical Exam  Constitutional: He appears well-developed and well-nourished. No distress.  HENT:  Head: Normocephalic and atraumatic.  Neck: Neck supple.  Cardiovascular: Normal rate and regular rhythm.   Pulmonary/Chest: Effort normal and breath sounds normal. No respiratory distress. He has no wheezes. He has no rales.  Abdominal: Soft. He exhibits no distension and no mass. There is no tenderness. There is no rebound and no guarding.  Neurological: He is alert. He exhibits normal muscle tone.  Skin: He is not diaphoretic.  Nursing note and vitals reviewed.   ED Course  Procedures (including critical care time) Labs Review Labs Reviewed  ACETAMINOPHEN LEVEL - Abnormal; Notable for the following:    Acetaminophen (Tylenol), Serum <10 (*)    All other components within normal limits  COMPREHENSIVE METABOLIC PANEL - Abnormal; Notable for the following:    AST 52 (*)    ALT 84 (*)    All other components within normal limits  ETHANOL - Abnormal; Notable for the following:    Alcohol, Ethyl (B) 156 (*)    All other components within normal limits  CBC  SALICYLATE LEVEL  URINE RAPID DRUG SCREEN, HOSP PERFORMED    Imaging Review No results found.   EKG Interpretation None      MDM   Final diagnoses:  Depression  Alcohol abuse    Afebrile, nontoxic patient with hx bipolar disorder, thoughts of suicide today while intoxicated.  Once sober pt denies SI, his fiance is comfortable with  his d/c home and also feels that he is safe.  Pt seems to have good insight into his bipolar disorder and has good resources for follow up.   D/C home with PCP, alcohol resources.  Discussed result, findings, treatment, and follow up  with patient.  Pt given return precautions.  Pt verbalizes understanding and agrees with plan.        Trixie Dredge, PA-C 02/05/15 2310  Derwood Kaplan, MD 02/06/15 2325

## 2015-02-05 NOTE — Discharge Instructions (Signed)
Read the information below.  You may return to the Emergency Department at any time for worsening condition or any new symptoms that concern you.  If you begin to feel suicidal at any point or feel you are a danger to yourself or others, please call 911 or return to the emergency department immediately for a recheck.    Depression Depression refers to feeling sad, low, down in the dumps, blue, gloomy, or empty. In general, there are two kinds of depression:  Normal sadness or normal grief. This kind of depression is one that we all feel from time to time after upsetting life experiences, such as the loss of a job or the ending of a relationship. This kind of depression is considered normal, is short lived, and resolves within a few days to 2 weeks. Depression experienced after the loss of a loved one (bereavement) often lasts longer than 2 weeks but normally gets better with time.  Clinical depression. This kind of depression lasts longer than normal sadness or normal grief or interferes with your ability to function at home, at work, and in school. It also interferes with your personal relationships. It affects almost every aspect of your life. Clinical depression is an illness. Symptoms of depression can also be caused by conditions other than those mentioned above, such as:  Physical illness. Some physical illnesses, including underactive thyroid gland (hypothyroidism), severe anemia, specific types of cancer, diabetes, uncontrolled seizures, heart and lung problems, strokes, and chronic pain are commonly associated with symptoms of depression.  Side effects of some prescription medicine. In some people, certain types of medicine can cause symptoms of depression.  Substance abuse. Abuse of alcohol and illicit drugs can cause symptoms of depression. SYMPTOMS Symptoms of normal sadness and normal grief include the following:  Feeling sad or crying for short periods of time.  Not caring about  anything (apathy).  Difficulty sleeping or sleeping too much.  No longer able to enjoy the things you used to enjoy.  Desire to be by oneself all the time (social isolation).  Lack of energy or motivation.  Difficulty concentrating or remembering.  Change in appetite or weight.  Restlessness or agitation. Symptoms of clinical depression include the same symptoms of normal sadness or normal grief and also the following symptoms:  Feeling sad or crying all the time.  Feelings of guilt or worthlessness.  Feelings of hopelessness or helplessness.  Thoughts of suicide or the desire to harm yourself (suicidal ideation).  Loss of touch with reality (psychotic symptoms). Seeing or hearing things that are not real (hallucinations) or having false beliefs about your life or the people around you (delusions and paranoia). DIAGNOSIS  The diagnosis of clinical depression is usually based on how bad the symptoms are and how long they have lasted. Your health care provider will also ask you questions about your medical history and substance use to find out if physical illness, use of prescription medicine, or substance abuse is causing your depression. Your health care provider may also order blood tests. TREATMENT  Often, normal sadness and normal grief do not require treatment. However, sometimes antidepressant medicine is given for bereavement to ease the depressive symptoms until they resolve. The treatment for clinical depression depends on how bad the symptoms are but often includes antidepressant medicine, counseling with a mental health professional, or both. Your health care provider will help to determine what treatment is best for you. Depression caused by physical illness usually goes away with appropriate medical treatment  of the illness. If prescription medicine is causing depression, talk with your health care provider about stopping the medicine, decreasing the dose, or changing to  another medicine. Depression caused by the abuse of alcohol or illicit drugs goes away when you stop using these substances. Some adults need professional help in order to stop drinking or using drugs. SEEK IMMEDIATE MEDICAL CARE IF:  You have thoughts about hurting yourself or others.  You lose touch with reality (have psychotic symptoms).  You are taking medicine for depression and have a serious side effect. FOR MORE INFORMATION  National Alliance on Mental Illness: www.nami.[employer] of Mental Health: http://www.maynard.net/ Document Released: 07/28/2000 Document Revised: 12/15/2013 Document Reviewed: 10/30/2011 [facility] Patient Information 2015 [place], [place]. This information is not intended to replace advice given to you by your health care provider. Make sure you discuss any questions you have with your health care provider.  Alcohol Use Disorder Alcohol use disorder is a mental disorder. It is not a one-time incident of heavy drinking. Alcohol use disorder is the excessive and uncontrollable use of alcohol over time that leads to problems with functioning in one or more areas of daily living. People with this disorder risk harming themselves and others when they drink to excess. Alcohol use disorder also can cause other mental disorders, such as mood and anxiety disorders, and serious physical problems. People with alcohol use disorder often misuse other drugs.  Alcohol use disorder is common and widespread. Some people with this disorder drink alcohol to cope with or escape from negative life events. Others drink to relieve chronic pain or symptoms of mental illness. People with a family history of alcohol use disorder are at higher risk of losing control and using alcohol to excess.  SYMPTOMS  Signs and symptoms of alcohol use disorder may include the following:   Consumption ofalcohol inlarger amounts or over a longer period of time than intended.  Multiple  unsuccessful attempts to cutdown or control alcohol use.   A great deal of time spent obtaining alcohol, using alcohol, or recovering from the effects of alcohol (hangover).  A strong desire or urge to use alcohol (cravings).   Continued use of alcohol despite problems at work, school, or home because of alcohol use.   Continued use of alcohol despite problems in relationships because of alcohol use.  Continued use of alcohol in situations when it is physically hazardous, such as driving a car.  Continued use of alcohol despite awareness of a physical or psychological problem that is likely related to alcohol use. Physical problems related to alcohol use can involve the brain, heart, liver, stomach, and intestines. Psychological problems related to alcohol use include intoxication, depression, anxiety, psychosis, delirium, and dementia.   The need for increased amounts of alcohol to achieve the same desired effect, or a decreased effect from the consumption of the same amount of alcohol (tolerance).  Withdrawal symptoms upon reducing or stopping alcohol use, or alcohol use to reduce or avoid withdrawal symptoms. Withdrawal symptoms include:  Racing heart.  Hand tremor.  Difficulty sleeping.  Nausea.  Vomiting.  Hallucinations.  Restlessness.  Seizures. DIAGNOSIS Alcohol use disorder is diagnosed through an assessment by your health care provider. Your health care provider may start by asking three or four questions to screen for excessive or problematic alcohol use. To confirm a diagnosis of alcohol use disorder, at least two symptoms must be present within a 50-month period. The severity of alcohol use disorder depends on the number  of symptoms:  Mild--two or three.  Moderate--four or five.  Severe--six or more. Your health care provider may perform a physical exam or use results from lab tests to see if you have physical problems resulting from alcohol use. Your health  care provider may refer you to a mental health professional for evaluation. TREATMENT  Some people with alcohol use disorder are able to reduce their alcohol use to low-risk levels. Some people with alcohol use disorder need to quit drinking alcohol. When necessary, mental health professionals with specialized training in substance use treatment can help. Your health care provider can help you decide how severe your alcohol use disorder is and what type of treatment you need. The following forms of treatment are available:   Detoxification. Detoxification involves the use of prescription medicines to prevent alcohol withdrawal symptoms in the first week after quitting. This is important for people with a history of symptoms of withdrawal and for heavy drinkers who are likely to have withdrawal symptoms. Alcohol withdrawal can be dangerous and, in severe cases, cause death. Detoxification is usually provided in a hospital or in-patient substance use treatment facility.  Counseling or talk therapy. Talk therapy is provided by substance use treatment counselors. It addresses the reasons people use alcohol and ways to keep them from drinking again. The goals of talk therapy are to help people with alcohol use disorder find healthy activities and ways to cope with life stress, to identify and avoid triggers for alcohol use, and to handle cravings, which can cause relapse.  Medicines.Different medicines can help treat alcohol use disorder through the following actions:  Decrease alcohol cravings.  Decrease the positive reward response felt from alcohol use.  Produce an uncomfortable physical reaction when alcohol is used (aversion therapy).  Support groups. Support groups are run by people who have quit drinking. They provide emotional support, advice, and guidance. These forms of treatment are often combined. Some people with alcohol use disorder benefit from intensive combination treatment provided by  specialized substance use treatment centers. Both inpatient and outpatient treatment programs are available. Document Released: 09/07/2004 Document Revised: 12/15/2013 Document Reviewed: 11/07/2012 [facility] Patient Information 2015 [place], [place]. This information is not intended to replace advice given to you by your health care provider. Make sure you discuss any questions you have with your health care provider.   Behavioral Health Resources in the [facility]  Intensive Outpatient Programs: [facility] Emergency Dept      [address] [place], [place] [phone] Both a day and evening program       [facility]     [address]        [place], [place] [address] [phone]         ADS: Alcohol & Drug Svcs [address] [place] [phone]  [facility] Mental Health ACCESS LINE: [phone] or [phone] [address] [place], [place] [address] [email or web address]  Mobile Crisis Teams:                                        [facility]atives         Mobile Crisis Care Unit [phone]             [facility] [address] Gin[place] Otto [phone]  Interventionist [facility] De[facility]sch [address] [place] [phone]  Self-Help/Support Groups: Mental Health Assoc. of [employer] of support groups [phone] (call for more info)  Narcotics Anonymous (NA) Caring Services [address] [place] - 2 meetings at this location  Residential Treatment Programs:  [facility]      [address]        [place]       [phone]         [facility] [address], [place] [address] [place], [place]  [address] [phone]  [facility] Treatment [facility]  [address] [place], [place] [address] [phone] Admissions: 8am-3pm  M-F  [facility]     [address]        [place], [place] [address]       [phone]         [facility] [address] [place], [place] [phone]  [facility] [address] [place], [place] [phone]  [facility] - Intensive Outpatient      [address]     [place], [place]       [phone]         [facility]     [address] [place], [place] [phone] or [phone]  Residential Treatment Services (RTS)  [address] [place], [place] [phone]  [facility] [address]                                               [address] [place] [phone]  [facility] Resources: [phone]               General Therapy                                                Angie Fava, PhD        [address] [place], [place] [address]         [phone]   Insurance  [facility]   [address] [place], [place] [address] [phone]  [facility] [address] [place], [place] [address] [phone] Insurance/Medicaid/sponsorship through [facility]                                              [address]                                        [place], [place] [address]    Therapy/tele-psych/case         [phone]          [facility] [address], [place]  [address]  Adolescent/group home/case management  [phone]                                           Creola Corn PhD       General therapy       Insurance   [phone]         Dr. Lolly Mustache Insurance [phone] M-F  [place] [facility]/Residential Medicaid, sponsorship [phone]   Emergency Department Resource Guide 1) Find a Doctor and Pay Out of Pocket Although you won't have to find out who is covered by your insurance plan, it is a good  idea to ask around and get recommendations. You will then need to call the office and see if the doctor you have chosen will accept you as a new patient and what types of options they offer for patients who are self-pay. Some doctors offer discounts or will set up payment plans for their patients who do not have insurance, but you will need to ask so you aren't surprised when you get to your appointment.  2) Contact Your Local Health Department Not all health departments have doctors that can see patients for sick visits, but many do, so it is worth a call to see if yours does. If you don't know where your local health department is, you can check in your phone book. The CDC also has a tool to help you locate your state's health department, and many state websites also have listings of all of their local health departments.  3) Find a Walk-in Clinic If your illness is not likely to be very severe or complicated, you may want to try a walk in clinic. These are popping up all over the country in pharmacies, drugstores, and shopping centers. They're usually staffed by nurse practitioners or physician assistants that have been trained to treat common illnesses and complaints. They're usually fairly quick and inexpensive. However, if you have serious medical issues or chronic medical problems, these are probably not your best option.  No Primary Care Doctor: - Call Health Connect at  [phone] - they can help you locate a primary care doctor that  accepts your insurance, provides certain services, etc. - Physician Referral Service- [phone]  Chronic Pain Problems: Organization         Address  Phone   Notes  [facility]  [phone] Patients need to be referred by their primary care doctor.   Medication Assistance: Organization         Address  Phone   Notes  [facility] Medication [facility] [address] [place] [place], [place] [address] [phone]  --Must be a resident of [facility] -- Must have NO insurance coverage whatsoever (no Medicaid/ Medicare, etc.) -- The pt. MUST have a primary care doctor that directs their care regularly and follows them in the community   MedAssist  [phone]   [employer]  [phone]    Agencies that provide inexpensive medical care: Organization         Address  Phone   Notes  [facility]  [phone]   [facility]    [phone]   [facility] [address] [place], [place] [address] [phone]   [facility] [address] [place] [place] [phone]   [facility]anned Parenthood    [phone]  [facility]    [phone]   [facility]  [address], [place] Phone:  [phone], Fax:  [phone] Hours of Operation:  9 am - 6 pm, M-F.  Also accepts Medicaid/Medicare and self-pay.  [facility]  [address], [place] Phone: [phone], Fax: [phone]. Hours of Operation:  8:30 am - 5:30 pm, M-F.  Also accepts Medicaid and self-pay.  [facility] [place] [address] [place] Phone: [phone]   [facility] [address] [place] Bence [place], [place] [phone], Ext. [phone] Mondays & Thursdays: 7-9 AM.  First 15 patients are seen on a first come, first serve basis.    Medicaid-accepting [facility] Providers:  Organization         Address  Phone   Notes  [facility] [address], [place] [phone] Also accepts self-pay patients.  [facility] [address] Laurell Josephs [place], [place]  [phone]   [facility] [address], [place] [phone]   [facility] Family Medicine [address], [place] [phone]   Renaye Rakers [address], [place]   [phone] Only  accepts [place] Access [place] patients after they have their name applied to their card.   Self-Pay (no insurance) in [facility]:  Organization         Address  Phone   Notes  Sickle Cell Patients, [facility] [address] [place], [place] [phone]   [facility] [address] [place], [place] [phone]   [facility] [place]  [address], [place] [phone]   [facility]/Dr. Osei-Bonsu  [address] [place] or [address], [place] [phone] Phone number for both [place] and [place] locations is the same.  Urgent Medical and [facility] [address] [place] [phone]   [facility] [address] [place] or [address] [phone]   [facility] [address] [place] [phone], phone; [phone], fax Sees patients 1st and 3rd Saturday of every month.  Must not qualify for public or private insurance (i.e. Medicaid, Medicare, [place] Health Choice, Veterans' Benefits)  Household income should be no more than 200% of the poverty level The clinic cannot treat you if you are pregnant or think you are pregnant  Sexually transmitted diseases are not treated at the clinic.    Dental Care: Organization         Address  Phone  Notes  [facility] Department of [facility] [address] [place], [place] [phone] Accepts children up to age 5 who are enrolled in [place] or [place] Health Choice; pregnant women with a Medicaid card; and children who have applied for Medicaid or [place] Health Choice, but were declined, whose parents can pay a reduced fee at time of service.  [facility] Department of [facility]  [address], [place] [phone] Accepts children up to age 31 who are enrolled in [place] or [place] Health Choice; pregnant  women with a Medicaid card; and children who have applied for Medicaid or [place] Health Choice, but were declined, whose parents can pay a reduced fee at time  of service.  [facility]  [address] [place], [place] [phone] Patients are seen by appointment only. Walk-ins are not accepted. [facility] will see patients 62 years of age and older. Monday - Tuesday (8am-5pm) Most Wednesdays (8:30-5pm) $30 per visit, cash only  [facility] Adult [facility] Access PROGRAM  [address], [facility] [phone] Patients are seen by appointment only. Walk-ins are not accepted. [facility] will see patients 46 years of age and older. One Wednesday Evening (Monthly: Volunteer Based).  $30 per visit, cash only  [employer]  [phone] for adults; Children under age 69, call Graduate Pediatric Dentistry at [phone]. Children aged 60-14, please call [phone] to request a pediatric application.  Dental services are provided in all areas of dental care including fillings, crowns and bridges, complete and partial dentures, implants, gum treatment, root canals, and extractions. Preventive care is also provided. Treatment is provided to both adults and children. Patients are selected via a lottery and there is often a waiting list.   [facility] [address] [place]  [phone] [email or web address]   [facility] [address] [place], [place] [phone], Ext. [phone] Second and Fourth Thursday of each month, opens at 6:30 AM; Clinic ends at 9 AM.  Patients are seen on a first-come first-served basis, and a limited number are seen during each clinic.   [facility]  [address] [place], [place] [phone]   Eligibility Requirements You must have lived in [place], [place], or [place] counties for at least the last three months.   You cannot be eligible for state or federal sponsored  [place], including [employer], [place], or [employer].   You generally cannot be eligible for healthcare insurance through your employer.    How to apply: Eligibility screenings are held every Tuesday and Wednesday afternoon from 1:00 pm until 4:00 pm. You do not need an appointment for the interview!  [facility] [address] [place], [place] [phone]   [facility] Health Department  [phone]   [facility] Health Department  [phone]   [facility] Health Department  [phone]    Behavioral Health Resources in the Community: Intensive Outpatient Programs Organization         Address  Phone  Notes  [facility] [address] [place], [place] [phone]   [facility] [address], [place], [place] [phone]   ADS: Alcohol & Drug Svcs [address], [place], [place]  [phone]   [facility] Mental Health [address]  [place], [place] [phone] or [phone]   Substance Abuse Resources Organization         Address  Phone  Notes  Alcohol [facility] Drug Services  [phone]   [facility]  [phone]   [facility]  [phone]   Floydene Flock  [phone]   Residential & Outpatient Substance Abuse Program  [phone]   Psychological Services Organization         Address  Phone  Notes  [facility] Health  [phone]   [facility] Services  [phone]   [facility] Mental Health [address], [place] [phone] or [phone]    Mobile Crisis Teams Organization         Address  Phone  Notes  [facility], Mobile Crisis Care Unit  [phone]   [facility]  [address] [place], [place] [phone]   [facility] [facility] [address] [place] [phone]    Self-Help/Support Groups Organization         Address  Phone              Notes  [facility] - variety of support groups  336- [phone] Call for more information  Narcotics Anonymous (NA), [facility] [address], [employer] [place]  2 meetings at this location   Statistician         Address  Phone  Notes  [facility] [address],    [place]  [phone]   [facility]  [address] [place] [address], [place], [place] [phone]   [facility] [address] [place], [place] [phone] Admissions: 8am-3pm M-F  [facility] [address]    [place], [place] [phone]   [facility] [address], [place], [place] [phone]   [facility] [address],  [place], [place] [phone]   [facility] Intensive Outpatient [address], [place], [place] [phone]   [facility] [address] [place]  [place], [place] [phone] or [phone]   [facility] [address], [place], [place] [phone] Accepts Medicaid  [facility] [address]  [place] [phone] Substance Abuse/Addiction Treatment   [facility] Organization         Address  Phone  Notes  [facility]  [phone]   Angie Fava, PhD [address] [place], [place]   [phone] or [phone]   [facility]   [address] [place], [place] [phone]   [facility] [address] [place], [place] [phone] Insurance/Medicaid/sponsorship through [facility] [address]                                    [place], [place] [phone] Therapy/tele-psych/case  [facility] [address], [place] [phone]    Dr. Lolly Mustache  [phone]   [facility]  [employer] [facility] Dept. 1) [address] [place] 2) [address] [place] 3)  [address], [place] [phone]  [phone]   [facility] Child Abuse Hotline [phone] or [phone] (After Hours)

## 2015-02-05 NOTE — ED Notes (Signed)
Pt requesting alcohol detox and psychiatric eval. Pt states that he is having suicidal thoughts, denies plan. Pt states that his last drink was 2.5 hrs ago. States that he drank 1 40 oz and 3 regular sized beers today. States he normally drinks a 12 pack - 24 pack every other day.

## 2015-02-05 NOTE — ED Notes (Signed)
Staffing notified of need for a sitter.

## 2015-02-09 ENCOUNTER — Encounter: Payer: Self-pay | Admitting: Cardiology

## 2015-02-18 ENCOUNTER — Encounter: Payer: Self-pay | Admitting: Cardiology

## 2015-04-01 ENCOUNTER — Encounter (HOSPITAL_COMMUNITY): Payer: Self-pay | Admitting: Emergency Medicine

## 2015-04-01 ENCOUNTER — Emergency Department (HOSPITAL_COMMUNITY): Payer: Medicaid Other

## 2015-04-01 ENCOUNTER — Emergency Department (HOSPITAL_COMMUNITY)
Admission: EM | Admit: 2015-04-01 | Discharge: 2015-04-01 | Disposition: A | Discharge status: Home / Self Care | Payer: Medicaid Other | Attending: Emergency Medicine | Admitting: Emergency Medicine

## 2015-04-01 DIAGNOSIS — E119 Type 2 diabetes mellitus without complications: Secondary | ICD-10-CM | POA: Diagnosis not present

## 2015-04-01 DIAGNOSIS — R079 Chest pain, unspecified: Secondary | ICD-10-CM | POA: Insufficient documentation

## 2015-04-01 DIAGNOSIS — F319 Bipolar disorder, unspecified: Secondary | ICD-10-CM | POA: Diagnosis not present

## 2015-04-01 DIAGNOSIS — Z7982 Long term (current) use of aspirin: Secondary | ICD-10-CM | POA: Diagnosis not present

## 2015-04-01 DIAGNOSIS — Z79899 Other long term (current) drug therapy: Secondary | ICD-10-CM | POA: Insufficient documentation

## 2015-04-01 DIAGNOSIS — Z8669 Personal history of other diseases of the nervous system and sense organs: Secondary | ICD-10-CM | POA: Diagnosis not present

## 2015-04-01 DIAGNOSIS — G20 Parkinson's disease: Secondary | ICD-10-CM | POA: Diagnosis not present

## 2015-04-01 DIAGNOSIS — Z87891 Personal history of nicotine dependence: Secondary | ICD-10-CM | POA: Diagnosis not present

## 2015-04-01 DIAGNOSIS — J45909 Unspecified asthma, uncomplicated: Secondary | ICD-10-CM | POA: Insufficient documentation

## 2015-04-01 DIAGNOSIS — Z791 Long term (current) use of non-steroidal anti-inflammatories (NSAID): Secondary | ICD-10-CM | POA: Insufficient documentation

## 2015-04-01 DIAGNOSIS — I10 Essential (primary) hypertension: Secondary | ICD-10-CM | POA: Insufficient documentation

## 2015-04-01 LAB — BASIC METABOLIC PANEL
ANION GAP: 14 (ref 5–15)
BUN: 6 mg/dL (ref 6–20)
CO2: 21 mmol/L — ABNORMAL LOW (ref 22–32)
Calcium: 9.2 mg/dL (ref 8.9–10.3)
Chloride: 104 mmol/L (ref 101–111)
Creatinine, Ser: 0.96 mg/dL (ref 0.61–1.24)
GLUCOSE: 94 mg/dL (ref 65–99)
Potassium: 3.4 mmol/L — ABNORMAL LOW (ref 3.5–5.1)
Sodium: 139 mmol/L (ref 135–145)

## 2015-04-01 LAB — I-STAT TROPONIN, ED
TROPONIN I, POC: 0 ng/mL (ref 0.00–0.08)
Troponin i, poc: 0 ng/mL (ref 0.00–0.08)

## 2015-04-01 LAB — CBC
HCT: 46.1 % (ref 39.0–52.0)
Hemoglobin: 16.5 g/dL (ref 13.0–17.0)
MCH: 33 pg (ref 26.0–34.0)
MCHC: 35.8 g/dL (ref 30.0–36.0)
MCV: 92.2 fL (ref 78.0–100.0)
Platelets: 266 10*3/uL (ref 150–400)
RBC: 5 MIL/uL (ref 4.22–5.81)
RDW: 13.3 % (ref 11.5–15.5)
WBC: 10.4 10*3/uL (ref 4.0–10.5)

## 2015-04-01 IMAGING — DX DG CHEST 2V
2 series · 2 of 2 positions shown · non-contrast
Comparison: [DATE]

CLINICAL DATA: Chest pain and shortness of breath for 1.5 days.

EXAM:
CHEST  2 VIEW

[w chest pa]
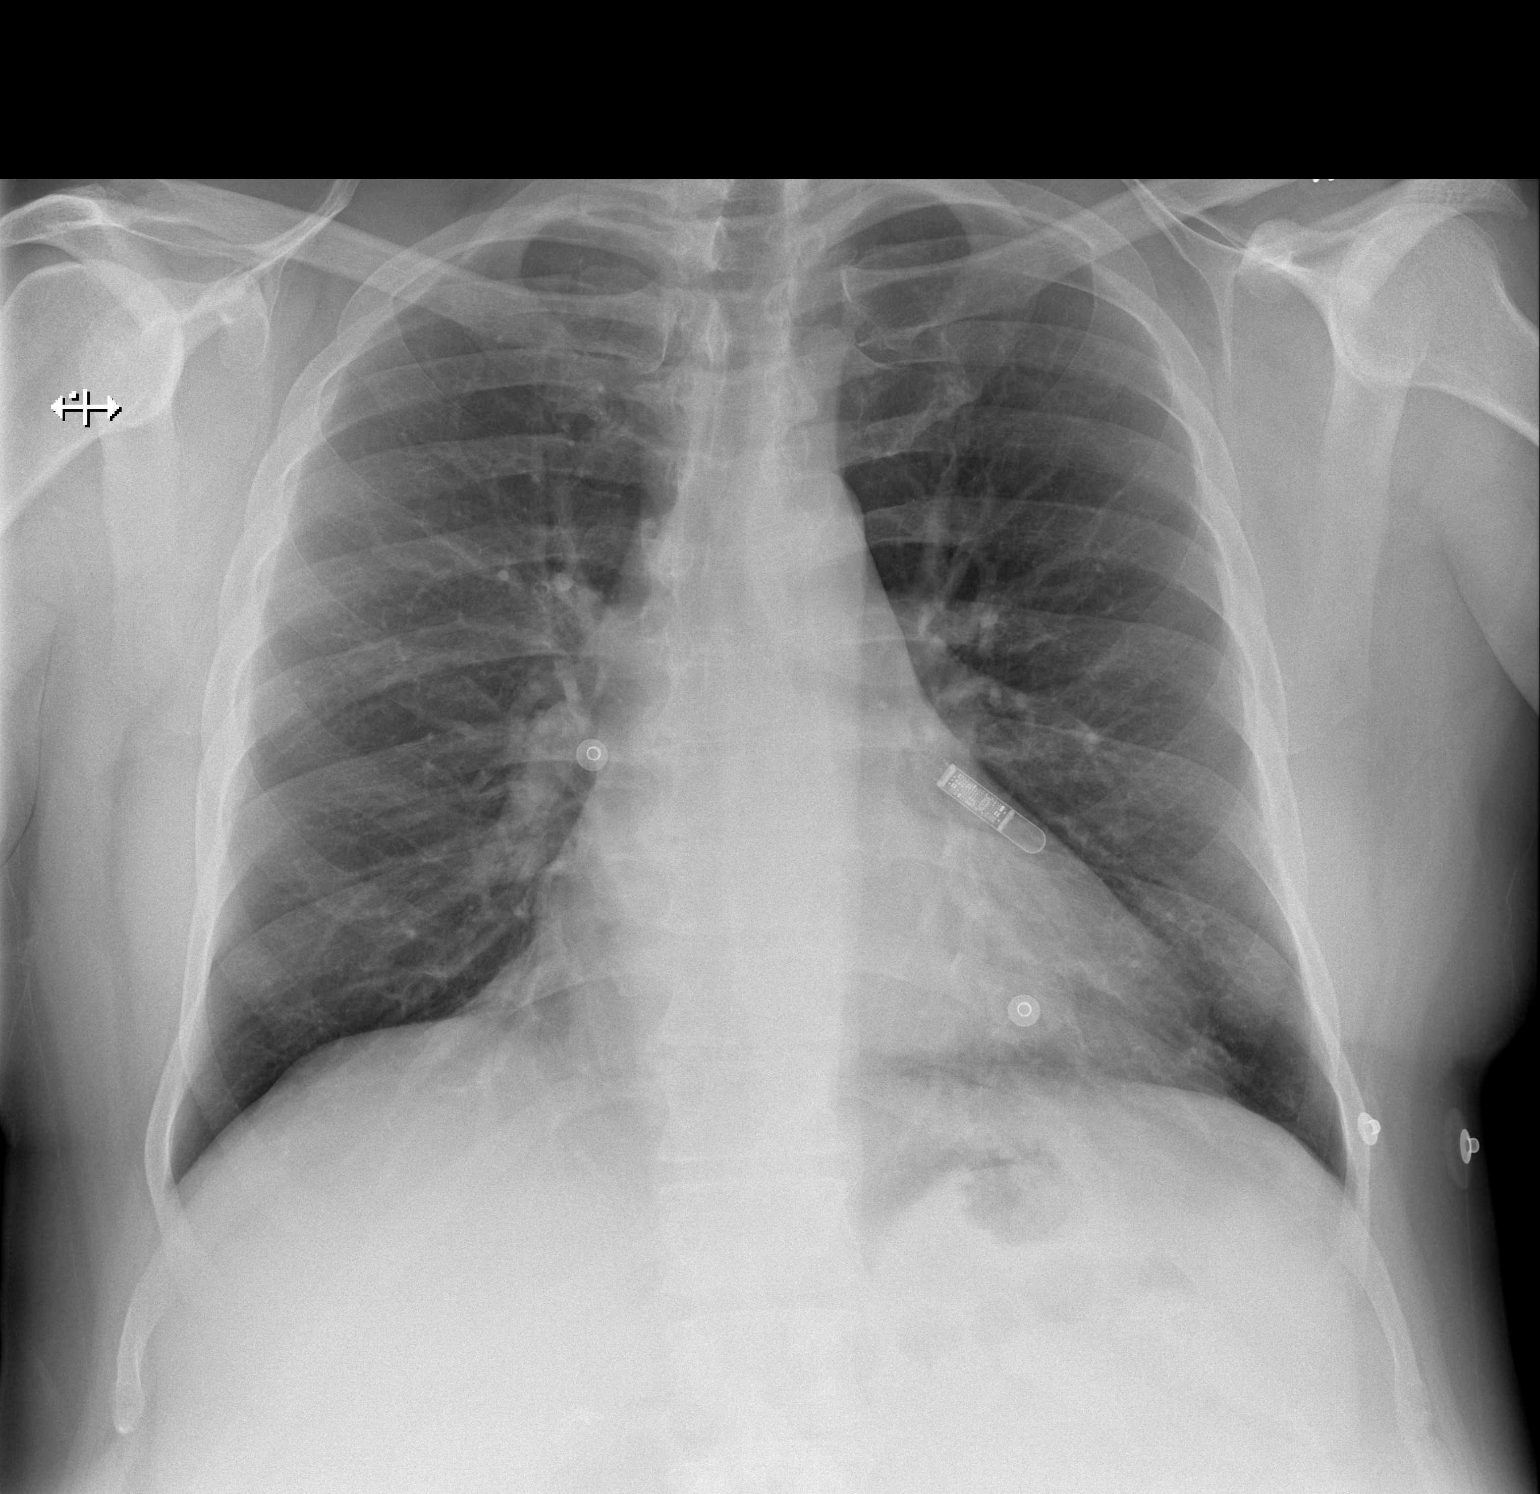

[w chest lat]
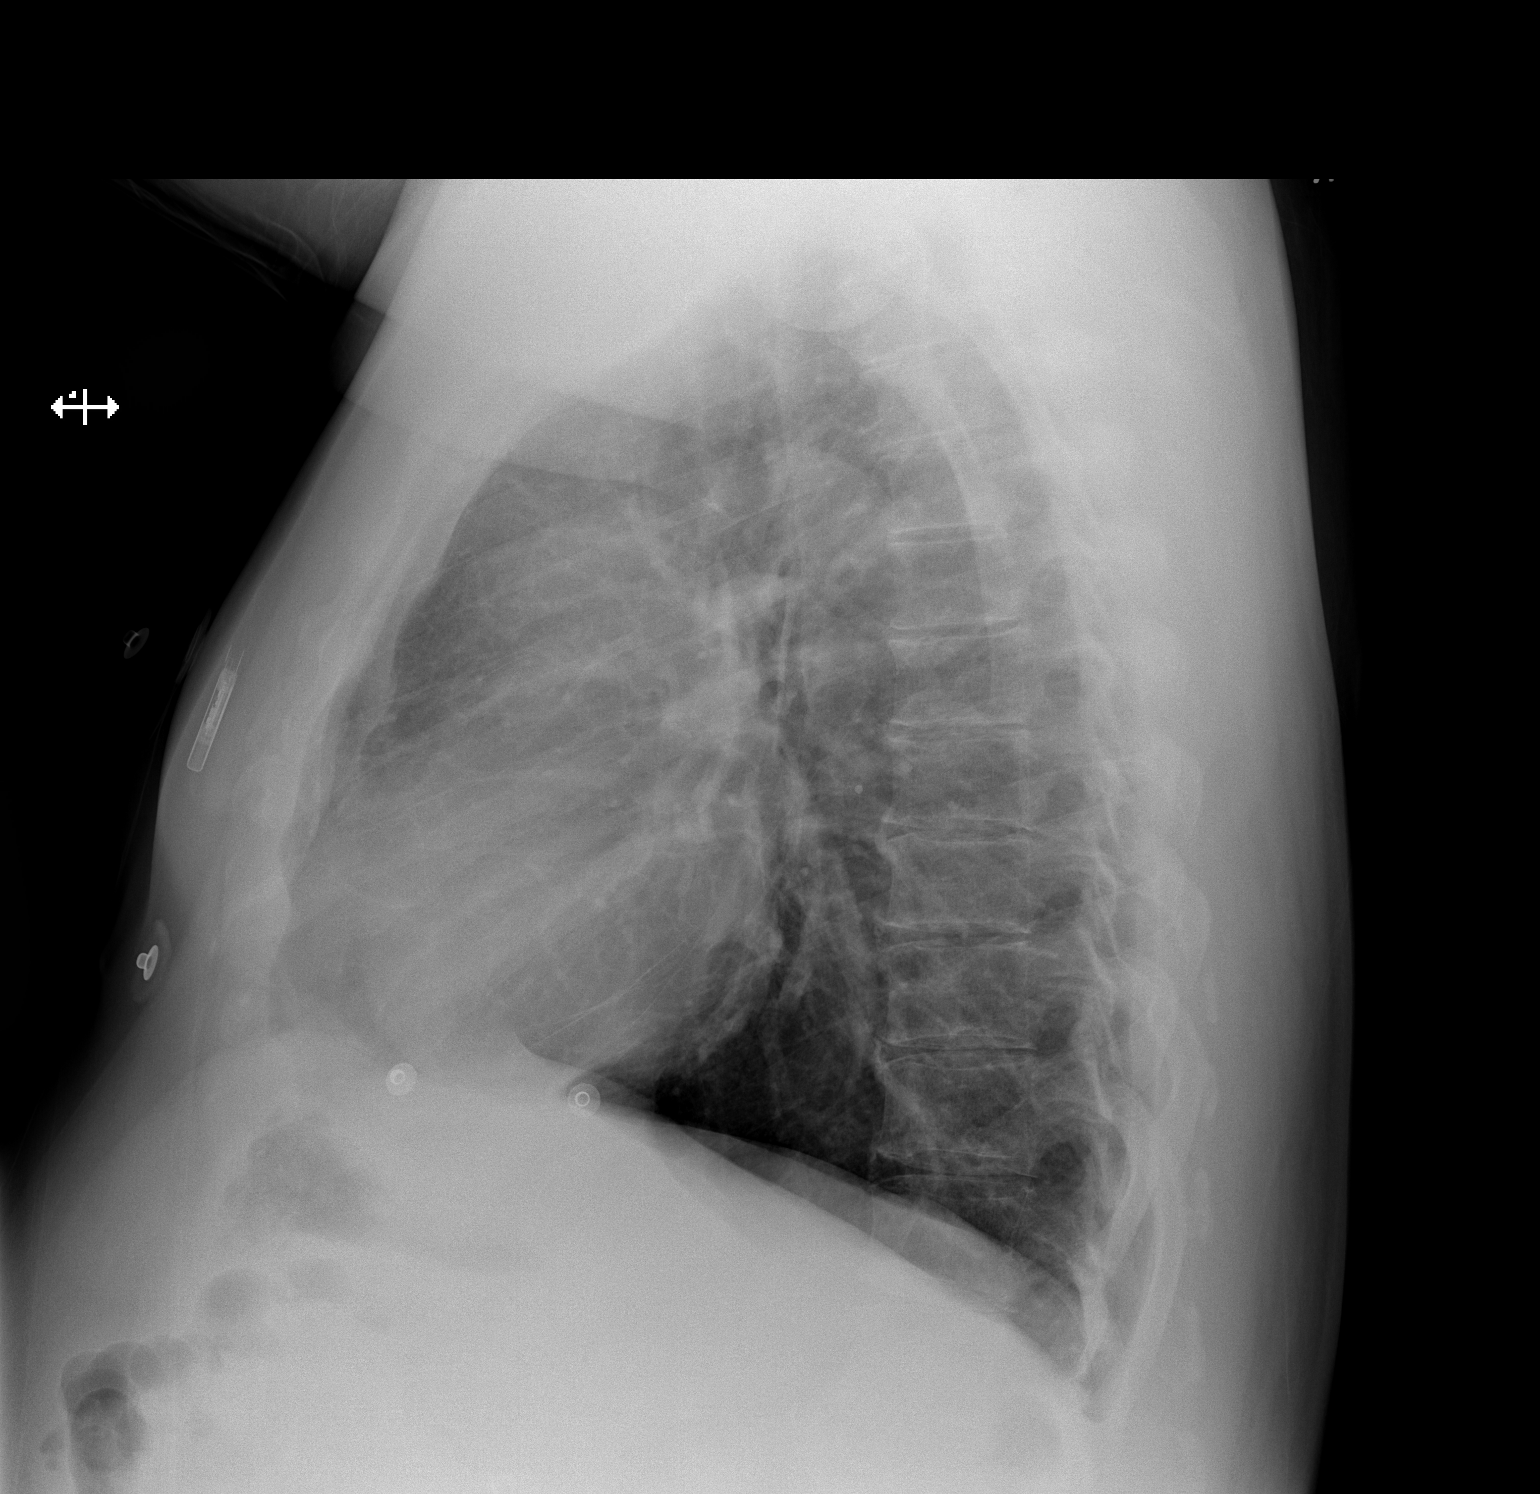

[2 of 2 positions shown; findings below may reference images not displayed]

FINDINGS: There is a cardiac recorder in the left anterior chest. Both lungs
are clear. Heart size is normal. The trachea is midline. Mild
degenerative changes in the thoracic spine. No large pleural
effusions.
IMPRESSION: No active cardiopulmonary disease.

## 2015-04-01 MED ORDER — NITROGLYCERIN 0.4 MG SL SUBL: 0.4000 mg | SUBLINGUAL_TABLET | SUBLINGUAL | Status: DC | PRN

## 2015-04-01 NOTE — ED Notes (Signed)
Pt in [facility]. Pt started having intermittent CP last night, did some yard work today and had severe sharp CP L side that radiated down L arm and back. Pt had SOB and nausea. Pt had last MI in dec. 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 Given 3NTG en route, 324 ASA. . Pt has pacemaker, not sure what kind, received letter in mail 87mo stated it was not working.

## 2015-04-01 NOTE — ED Provider Notes (Signed)
CSN: [id number]     Arrival date & time 04/01/15  1928 History   First MD Initiated Contact with Patient 04/01/15 1936     Chief Complaint  Patient presents with  . Chest Pain     (Consider location/radiation/quality/duration/timing/severity/associated sxs/prior Treatment) Patient is a 44 y.o. male presenting with chest pain. The history is provided by the patient.  Chest Pain Pain location:  Substernal area Pain quality: aching and burning   Pain radiates to:  L shoulder and R shoulder Pain radiates to the back: no   Pain severity:  Severe Onset quality:  Sudden Duration:  1 day Timing:  Intermittent Progression:  Waxing and waning Chronicity:  Recurrent Relieved by:  Nothing Worsened by:  Nothing tried Ineffective treatments:  None tried Associated symptoms: no abdominal pain, no fever, no headache, no palpitations, no shortness of breath and not vomiting   Risk factors: no coronary artery disease, no diabetes mellitus, no high cholesterol and no hypertension     44 yo M the chief complaint of chest pain. This started today while he was working in the yard. Patient states that he had been trying to move from an old house to a new house and had some worsening discomfort. The pain is sharp pressure in his chest that radiates to bilateral shoulders. Comes and goes spontaneously patient is unable to predict what makes it better or worse. Patient nitroglycerin and the able to ride improve his pain. Patient states that he has had 6 MIs in the past though had negative cath last year. Patient also has a loop recorder implanted for a syncopal event that he had a patient states that was implanted because he had a cardiac arrest. No noted medical documentation to support the statements.  Past Medical History  Diagnosis Date  . Restless legs syndrome (RLS)   . Obstructive sleep apnea (adult) (pediatric)   . Pain in limb   . 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 Hyperlipidemia   . Other and unspecified hyperlipidemia   . Backache, unspecified   . Unspecified essential hypertension   . Unspecified vitamin D deficiency   . Unspecified asthma(493.90)   . MI (myocardial infarction) x5    per report, normal catheterization 2015  . Diabetes mellitus without complication   . Parkinson disease   . PTSD (post-traumatic stress disorder)   . Bipolar 1 disorder   . Major depression    Past Surgical History  Procedure Laterality Date  . Cholecystectomy    . Cardiac catheterization  2015    non obstructive CAD, normal LV function  . Left heart catheterization with coronary angiogram N/A 10/28/2013    Procedure: LEFT HEART CATHETERIZATION WITH CORONARY ANGIOGRAM;  Surgeon: Peter M Swaziland, MD;  Location: [facility] C[facility] LAB;  Service: Cardiovascular;  Laterality: N/A;  . Loop recorder implant N/A 07/20/2014    Procedure: LOOP RECORDER IMPLANT;  Surgeon: Marinus Maw, MD;  Location: [facility] CATH LAB;  Service: Cardiovascular;  Laterality: N/A;  . Knee surgery     Family History  Problem Relation Age of Onset  . Hypertension Father   . Heart disease Father   . Hypercholesterolemia Father   . Thyroid disease Father   . Stroke Father   . Cancer Father   . Hypertension Paternal Grandfather   . Heart disease Paternal Grandfather   . Hypercholesterolemia Paternal Grandfather   . Stroke Paternal Grandfather   . Colon cancer Paternal Grandfather   . Diabetes Paternal Grandfather   .  Hypertension Sister   . Hypercholesterolemia Sister   . Heart disease Maternal Grandfather   . Diabetes Maternal Grandfather   . Lung cancer Maternal Grandmother   . Diabetes Maternal Grandmother   . Diabetes Paternal Grandmother   . Arthritis    . Brain cancer Mother    Social History  Substance Use Topics  . Smoking status: Former Games developer  . Smokeless tobacco: Current User    Types: Chew  . Alcohol Use: Yes     Comment: daily 12-24 beers    Review of Systems   Constitutional: Negative for fever and chills.  HENT: Negative for congestion and facial swelling.   Eyes: Negative for discharge and visual disturbance.  Respiratory: Negative for shortness of breath.   Cardiovascular: Positive for chest pain. Negative for palpitations.  Gastrointestinal: Negative for vomiting, abdominal pain and diarrhea.  Musculoskeletal: Negative for myalgias and arthralgias.  Skin: Negative for color change and rash.  Neurological: Negative for tremors, syncope and headaches.  Psychiatric/Behavioral: Negative for confusion and dysphoric mood.      Allergies  Bee venom; Mushroom extract complex; and Ambien  Home Medications   Prior to Admission medications   Medication Sig Start Date End Date Taking? Authorizing Provider  aspirin 81 MG tablet Take 81 mg by mouth daily.   Yes Historical Provider, MD  BusPIRone HCl (BUSPAR PO) Take 1 tablet by mouth at bedtime.    Yes Historical Provider, MD  cyclobenzaprine (FLEXERIL) 5 MG tablet Take 5 mg by mouth 3 (three) times daily as needed for muscle spasms.   Yes Historical Provider, MD  EPINEPHrine (EPIPEN 2-PAK) 0.3 mg/0.3 mL SOAJ injection Inject 0.3 mg into the muscle as needed (anaphylaxis).    Yes Historical Provider, MD  meloxicam (MOBIC) 15 MG tablet Take 15 mg by mouth daily.   Yes Historical Provider, MD  nitroGLYCERIN (NITROSTAT) 0.4 MG SL tablet Place 0.4 mg under the tongue every 5 (five) minutes as needed for chest pain.   Yes Historical Provider, MD  primidone (MYSOLINE) 50 MG tablet Take 150 mg by mouth 3 (three) times daily.    Yes Historical Provider, MD  amLODipine (NORVASC) 5 MG tablet Take 1 tablet (5 mg total) by mouth daily. Patient not taking: Reported on 02/05/2015 07/20/14   Nishant Dhungel, MD   BP 138/92 mmHg  Pulse 89  Temp(Src) 98 F (36.7 C) (Oral)  Resp 28  Ht 5\' 8"  (1.727 m)  Wt 200 lb (90.719 kg)  BMI 30.42 kg/m2  SpO2 98% Physical Exam  Constitutional: He is oriented to person,  place, and time. He appears well-developed and well-nourished.  HENT:  Head: Normocephalic and atraumatic.  Eyes: EOM are normal. Pupils are equal, round, and reactive to light.  Neck: Normal range of motion. Neck supple. No JVD present.  Cardiovascular: Normal rate and regular rhythm.  Exam reveals no gallop and no friction rub.   No murmur heard. Pulmonary/Chest: No respiratory distress. He has no wheezes.  Abdominal: He exhibits no distension. There is no rebound and no guarding.  Musculoskeletal: Normal range of motion.  Neurological: He is alert and oriented to person, place, and time.  Skin: No rash noted. No pallor.  Psychiatric: He has a normal mood and affect. His behavior is normal.    ED Course  Procedures (including critical care time) Labs Review Labs Reviewed  BASIC METABOLIC PANEL - Abnormal; Notable for the following:    Potassium 3.4 (*)    CO2 21 (*)    All  other components within normal limits  CBC  I-STAT TROPOININ, ED  Rosezena Sensor, ED    Imaging Review Dg Chest 2 View  04/01/2015   CLINICAL DATA:  Chest pain and shortness of breath for 1.5 days.  EXAM: CHEST  2 VIEW  COMPARISON:  07/18/2014  FINDINGS: There is a cardiac recorder in the left anterior chest. Both lungs are clear. Heart size is normal. The trachea is midline. Mild degenerative changes in the thoracic spine. No large pleural effusions.  IMPRESSION: No active cardiopulmonary disease.   Electronically Signed   By: Richarda Overlie M.D.   On: 04/01/2015 20:25   I have personally reviewed and evaluated these images and lab results as part of my medical decision-making.   EKG Interpretation   Date/Time:  Thursday April 01 2015 19:40:59 EDT Ventricular Rate:  93 PR Interval:  137 QRS Duration: 90 QT Interval:  367 QTC Calculation: 456 R Axis:   26 Text Interpretation:  Sinus rhythm Baseline wander in lead(s) V1 No  significant change was found Confirmed by Dawanna Grauberger MD, Reuel Boom ([id number]) on   04/01/2015 10:32:50 PM      MDM   Final diagnoses:  Chest pain, unspecified chest pain type    44 yo M with a chief complaint of chest pain. Low risk by hear score. Will obtain a delta troponin. Initial troponin negative chest x-ray unremarkable  Delta troponin negative.  11:20 PM:  I have discussed the diagnosis/risks/treatment options with the patient and family and believe the pt to be eligible for discharge home to follow-up with PCP. We also discussed returning to the ED immediately if new or worsening sx occur. We discussed the sx which are most concerning (e.g., sudden worsening pain) that necessitate immediate return. Medications administered to the patient during their visit and any new prescriptions provided to the patient are listed below.  Medications given during this visit Medications  nitroGLYCERIN (NITROSTAT) SL tablet 0.4 mg (not administered)    Discharge Medication List as of 04/01/2015 11:10 PM       The patient appears reasonably screen and/or stabilized for discharge and I doubt any other medical condition or other [facility] requiring further screening, evaluation, or treatment in the ED at this time prior to discharge.    Melene Plan, DO 04/01/15 2320

## 2015-04-01 NOTE — Discharge Instructions (Signed)

## 2015-04-16 ENCOUNTER — Ambulatory Visit (INDEPENDENT_AMBULATORY_CARE_PROVIDER_SITE_OTHER): Payer: Medicaid Other | Admitting: *Deleted

## 2015-04-16 DIAGNOSIS — R55 Syncope and collapse: Secondary | ICD-10-CM

## 2015-04-21 NOTE — Progress Notes (Signed)
Loop recorder 

## 2015-04-22 ENCOUNTER — Encounter (HOSPITAL_COMMUNITY): Payer: Self-pay | Admitting: *Deleted

## 2015-04-22 ENCOUNTER — Emergency Department (HOSPITAL_COMMUNITY)
Admission: EM | Admit: 2015-04-22 | Discharge: 2015-04-23 | Disposition: A | Discharge status: Home / Self Care | Payer: Medicaid Other | Attending: Emergency Medicine | Admitting: Emergency Medicine

## 2015-04-22 DIAGNOSIS — I10 Essential (primary) hypertension: Secondary | ICD-10-CM | POA: Diagnosis not present

## 2015-04-22 DIAGNOSIS — E119 Type 2 diabetes mellitus without complications: Secondary | ICD-10-CM | POA: Insufficient documentation

## 2015-04-22 DIAGNOSIS — Z791 Long term (current) use of non-steroidal anti-inflammatories (NSAID): Secondary | ICD-10-CM | POA: Insufficient documentation

## 2015-04-22 DIAGNOSIS — J45909 Unspecified asthma, uncomplicated: Secondary | ICD-10-CM | POA: Insufficient documentation

## 2015-04-22 DIAGNOSIS — Z79899 Other long term (current) drug therapy: Secondary | ICD-10-CM | POA: Diagnosis not present

## 2015-04-22 DIAGNOSIS — R45851 Suicidal ideations: Secondary | ICD-10-CM | POA: Diagnosis present

## 2015-04-22 DIAGNOSIS — G20 Parkinson's disease: Secondary | ICD-10-CM | POA: Insufficient documentation

## 2015-04-22 DIAGNOSIS — Z7982 Long term (current) use of aspirin: Secondary | ICD-10-CM | POA: Diagnosis not present

## 2015-04-22 DIAGNOSIS — F419 Anxiety disorder, unspecified: Secondary | ICD-10-CM | POA: Diagnosis not present

## 2015-04-22 DIAGNOSIS — Z87891 Personal history of nicotine dependence: Secondary | ICD-10-CM | POA: Diagnosis not present

## 2015-04-22 DIAGNOSIS — Z8659 Personal history of other mental and behavioral disorders: Secondary | ICD-10-CM

## 2015-04-22 DIAGNOSIS — I252 Old myocardial infarction: Secondary | ICD-10-CM | POA: Insufficient documentation

## 2015-04-22 LAB — CBC
HEMATOCRIT: 48.5 % (ref 39.0–52.0)
HEMOGLOBIN: 17.2 g/dL — AB (ref 13.0–17.0)
MCH: 33.2 pg (ref 26.0–34.0)
MCHC: 35.5 g/dL (ref 30.0–36.0)
MCV: 93.6 fL (ref 78.0–100.0)
PLATELETS: 326 10*3/uL (ref 150–400)
RBC: 5.18 MIL/uL (ref 4.22–5.81)
RDW: 13.2 % (ref 11.5–15.5)
WBC: 11.2 10*3/uL — AB (ref 4.0–10.5)

## 2015-04-22 LAB — SALICYLATE LEVEL: Salicylate Lvl: <4 mg/dL (ref 2.8–30.0)

## 2015-04-22 LAB — ACETAMINOPHEN LEVEL: Acetaminophen (Tylenol), Serum: <10 ug/mL — ABNORMAL LOW (ref 10–30)

## 2015-04-22 LAB — COMPREHENSIVE METABOLIC PANEL
ALBUMIN: 4.4 g/dL (ref 3.5–5.0)
ALK PHOS: 68 U/L (ref 38–126)
ALT: 124 U/L — ABNORMAL HIGH (ref 17–63)
AST: 83 U/L — AB (ref 15–41)
Anion gap: 13 (ref 5–15)
BILIRUBIN TOTAL: 0.9 mg/dL (ref 0.3–1.2)
BUN: 7 mg/dL (ref 6–20)
CALCIUM: 9.4 mg/dL (ref 8.9–10.3)
CO2: 21 mmol/L — ABNORMAL LOW (ref 22–32)
Chloride: 101 mmol/L (ref 101–111)
Creatinine, Ser: 0.99 mg/dL (ref 0.61–1.24)
GFR calc Af Amer: >60 mL/min (ref >60)
Glucose, Bld: 106 mg/dL — ABNORMAL HIGH (ref 65–99)
Potassium: 3.8 mmol/L (ref 3.5–5.1)
Sodium: 135 mmol/L (ref 135–145)
TOTAL PROTEIN: 7.2 g/dL (ref 6.5–8.1)

## 2015-04-22 LAB — RAPID URINE DRUG SCREEN, HOSP PERFORMED
AMPHETAMINES: NOT DETECTED
BARBITURATES: NOT DETECTED
BENZODIAZEPINES: NOT DETECTED
Cocaine: NOT DETECTED
Opiates: NOT DETECTED
TETRAHYDROCANNABINOL: NOT DETECTED

## 2015-04-22 LAB — ETHANOL: Alcohol, Ethyl (B): 260 mg/dL — ABNORMAL HIGH (ref <5)

## 2015-04-22 NOTE — ED Provider Notes (Addendum)
CSN: [id number]     Arrival date & time 04/22/15  2032 History  This chart was scribed for Shon Baton, MD by Freida Busman, ED Scribe. This patient was seen in room B15C/B15C and the patient's care was started 11:31 PM.    Chief Complaint  Patient presents with  . Suicidal  . Homicidal  . Alcohol Problem    The history is provided by the patient. No language interpreter was used.   HPI Comments:  Samuel Williams is a 43 y.o. male with a history of PTSD and anxiety who presents to the Emergency Department complaining of SI today without a plan. Pt states he has felt unsettled after clown sighting this evening, states he chased the clown through the woods with his gun. 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 He also reports a h/o SI a "long time ago". 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 He has been evaluated in the past by psychiatrist and is currently on buspar for anxiety. Pt has no physical complaints at this time; denies fever, CP and SOB. No alleviating factors noted.  Based on triage notes, patient's spouse reports that he blew up earlier and has an alcohol problem. He was reporting suicidality and homicidality. She is not at the bedside at this time.  Past Medical History  Diagnosis Date  . Restless legs syndrome (RLS)   . Obstructive sleep apnea (adult) (pediatric)   . Pain in limb   . 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 Hyperlipidemia   . Other and unspecified hyperlipidemia   . Backache, unspecified   . Unspecified essential hypertension   . Unspecified vitamin D deficiency   . Unspecified asthma(493.90)   . MI (myocardial infarction) x5    per report, normal catheterization 2015  . Diabetes mellitus without complication   . Parkinson disease   . PTSD (post-traumatic stress disorder)   . Bipolar 1 disorder   . Major  depression    Past Surgical History  Procedure Laterality Date  . Cholecystectomy    . Cardiac catheterization  2015    non obstructive CAD, normal LV function  . Left heart catheterization with coronary angiogram N/A 10/28/2013    Procedure: LEFT HEART CATHETERIZATION WITH CORONARY ANGIOGRAM;  Surgeon: Peter M Swaziland, MD;  Location[facility] CATH LAB;  Service: Cardiovascular;  Laterality: N/A;  . Loop recorder implant N/A 07/20/2014    Procedure: LOOP RECORDER IMPLANT;  Surgeon: Marinus Maw, MD;  [facility] CATH LAB;  Service: Cardiovascular;  Laterality: N/A;  . Knee surgery     Family History  Problem Relation Age of Onset  . Hypertension Father   . Heart disease Father   . Hypercholesterolemia Father   . Thyroid disease Father   . Stroke Father   . Cancer Father   . Hypertension Paternal Grandfather   . Heart disease Paternal Grandfather   . Hypercholesterolemia Paternal Grandfather   . Stroke Paternal Grandfather   . Colon cancer Paternal Grandfather   . Diabetes Paternal Grandfather   . Hypertension Sister   . Hypercholesterolemia Sister   . Heart disease Maternal Grandfather   . Diabetes Maternal Grandfather   . Lung cancer Maternal Grandmother   . Diabetes Maternal Grandmother   . Diabetes Paternal Grandmother   . Arthritis    . Brain cancer Mother    Social History  Substance Use  Topics  . Smoking status: Former Games developer  . Smokeless tobacco: Current User    Types: Chew  . Alcohol Use: Yes     Comment: daily 12-24 beers    Review of Systems  Constitutional: Negative.  Negative for fever.  Respiratory: Negative.  Negative for chest tightness and shortness of breath.   Cardiovascular: Negative.  Negative for chest pain.  Gastrointestinal: Negative.  Negative for abdominal pain.  Genitourinary: Negative.  Negative for dysuria.  Neurological: Negative for headaches.  Psychiatric/Behavioral: Positive for suicidal ideas and dysphoric mood. Negative for  hallucinations, confusion, sleep disturbance and decreased concentration. The patient is nervous/anxious.   All other systems reviewed and are negative.     Allergies  Bee venom; Mushroom extract complex; and Ambien  Home Medications   Prior to Admission medications   Medication Sig Start Date End Date Taking? Authorizing Provider  aspirin 81 MG tablet Take 81 mg by mouth daily.   Yes Historical Provider, MD  BusPIRone HCl (BUSPAR PO) Take 1 tablet by mouth at bedtime.    Yes Historical Provider, MD  cyclobenzaprine (FLEXERIL) 5 MG tablet Take 5 mg by mouth 3 (three) times daily as needed for muscle spasms.   Yes Historical Provider, MD  EPINEPHrine (EPIPEN 2-PAK) 0.3 mg/0.3 mL SOAJ injection Inject 0.3 mg into the muscle as needed (anaphylaxis).    Yes Historical Provider, MD  meloxicam (MOBIC) 15 MG tablet Take 15 mg by mouth daily.   Yes Historical Provider, MD  nitroGLYCERIN (NITROSTAT) 0.4 MG SL tablet Place 0.4 mg under the tongue every 5 (five) minutes as needed for chest pain.   Yes Historical Provider, MD  primidone (MYSOLINE) 50 MG tablet Take 150 mg by mouth 3 (three) times daily.    Yes Historical Provider, MD  amLODipine (NORVASC) 5 MG tablet Take 1 tablet (5 mg total) by mouth daily. Patient not taking: Reported on 02/05/2015 07/20/14   Nishant Dhungel, MD   BP 136/83 mmHg  Pulse 112  Temp(Src) 97.9 F (36.6 C) (Oral)  Resp 22  Ht  (1.753 m)  Wt 200 lb (90.719 kg)  BMI 29.52 kg/m2  SpO2 94% Physical Exam  Constitutional: He is oriented to person, place, and time. He appears well-developed and well-nourished. No distress.  HENT:  Head: Normocephalic and atraumatic.  Eyes: Pupils are equal, round, and reactive to light.  Bilateral injected conjunctiva  Cardiovascular: Normal rate, regular rhythm and normal heart sounds.   No murmur heard. Pulmonary/Chest: Effort normal and breath sounds normal. No respiratory distress. He has no wheezes.  Abdominal: Soft. Bowel  sounds are normal. There is no tenderness. There is no rebound.  Musculoskeletal: He exhibits no edema.  Neurological: He is alert and oriented to person, place, and time.  Skin: Skin is warm and dry.  Psychiatric:  Anxious, pressured speech  Nursing note and vitals reviewed.   ED Course  Procedures (including critical care time) Labs Review Labs Reviewed  COMPREHENSIVE METABOLIC PANEL - Abnormal; Notable for the following:    CO2 21 (*)    Glucose, Bld 106 (*)    AST 83 (*)    ALT 124 (*)    All other components within normal limits  ETHANOL - Abnormal; Notable for the following:    Alcohol, Ethyl (B) 260 (*)    All other components within normal limits  ACETAMINOPHEN LEVEL - Abnormal; Notable for the following:    Acetaminophen (Tylenol), Serum <10 (*)    All other components within normal limits  CBC - Abnormal; Notable for the following:    WBC 11.2 (*)    Hemoglobin 17.2 (*)    All other components within normal limits  SALICYLATE LEVEL  URINE RAPID DRUG SCREEN, HOSP PERFORMED    Imaging Review No results found. I have personally reviewed and evaluated these images and lab results as part of my medical decision-making.   EKG Interpretation None      MDM   Final diagnoses:  Suicidal ideation  History of posttraumatic stress disorder (PTSD)    Patient presents with suicidal ideation after an episode of seeing a clown in the woods to the pursued. It brought up PTSD feelings for the patient.  He also reports alcohol use. Denies wanting help.  Unable to obtain collateral information at this time.  EtOH 260. Clinically patient is able to provide history. Will evaluate by TTS.    Shon Baton, MD 04/22/15 2354  Patient evaluated by TTS. Per TTS, no longer suicidal. Does not meet IVC criteria. Patient does not want treatment. Will discharge.  Shon Baton, MD 04/23/15 [phone]

## 2015-04-22 NOTE — ED Notes (Signed)
Pt arrives with wife. Wife states that pt is feeling suicidal and homicidal. States that he needs to detox from alcohol. Pt will not answer any questions. Wife states that it is because he "blew up" on the way to the ED and at home, punched a wall. Wife states that pt drinks a 12 pack or more. States today he has had 12 budweisers and 9 twisted teas.

## 2015-04-22 NOTE — ED Notes (Signed)
Pt asked his fiance to step out of the room and states that he was drinking today and outside and saw a clown ran through his back yard in the woods and he chased the clown and shot at him. 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

## 2015-04-22 NOTE — ED Notes (Signed)
All belongings conveyed to [facility] per patient request.

## 2015-04-22 NOTE — ED Notes (Signed)
pts fiance went home and took all pts belongings.

## 2015-04-22 NOTE — ED Notes (Signed)
Staffing office notified of need for sitter. Pt changing into burgundy scrubs.
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
## 2015-04-23 NOTE — ED Notes (Signed)
Pt. Placed in room with only his scrubs. No belongings with pt. Nurse notified.
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
## 2015-04-23 NOTE — Discharge Instructions (Signed)
Post-traumatic Stress You have post-traumatic stress disorder (PTSD). This condition causes many different symptoms including: emotional outbursts, anxiety, sleeping problems, social withdrawal, and drug abuse. PTSD often follows a particularly traumatic event such as war, or natural disasters like hurricanes, earthquakes, or floods. It can also be seen after personal traumas such as accidents, rape, or the death of someone you love. Symptoms may be delayed for days or even years. Emotional numbing and the inability to feel your emotions, may be the earliest sign. Periods of agitation, aggression, and inability to perform ordinary tasks are common with PTSD. Nightmares and daytime memories of the trauma often bring on uncontrolled symptoms. Sufferers typically startle easily and avoid reminders of the trauma. Panic attacks, feelings of extreme guilt, and blackouts are often reported. Treatment is very helpful, especially group therapy. Healing happens when emotional traumas are shared with others who have a sympathetic ear. The [facility] have helped over 185,000 veterans with this problem. Medication is also very effective. The symptoms can become chronic and lifelong, so it is important to get help. Call your caregiver or a counselor who deals with this type of problem for further assistance. Document Released: 09/07/2004 Document Revised: 10/23/2011 Document Reviewed: 07/31/2005 [facility] Patient Information 2015 [place], [place]. This information is not intended to replace advice given to you by your health care provider. Make sure you discuss any questions you have with your health care provider.  If you have recurrent feelings of wanting to hurt yourself, please return immediately.

## 2015-04-23 NOTE — ED Notes (Signed)
Pt moved to C20, Currently completing TTS assessment w/ [facility].

## 2015-04-23 NOTE — ED Notes (Signed)
Pt sts he is ready to go.  Pt sts he found out from his wife that he had a kitten at home die while he was gone.  Pt tearful while telling me this story.  Pt made aware of reasons for delay.

## 2015-04-23 NOTE — ED Notes (Signed)
TTS in process at this time  

## 2015-05-12 LAB — CUP PACEART REMOTE DEVICE CHECK: MDC IDC SESS DTM: 20160902230556

## 2015-05-12 NOTE — Progress Notes (Signed)
Carelink summary report received. Battery status OK. Normal device function. No new tachy, brady, pause, or AF episodes. 4 symptom episodes (since 01/08/15), no EGMs available. Monthly summary reports and ROV with GT in 07/2015.

## 2015-05-17 ENCOUNTER — Ambulatory Visit (INDEPENDENT_AMBULATORY_CARE_PROVIDER_SITE_OTHER): Payer: Medicaid Other | Admitting: *Deleted

## 2015-05-17 DIAGNOSIS — R55 Syncope and collapse: Secondary | ICD-10-CM | POA: Diagnosis not present

## 2015-05-17 NOTE — Progress Notes (Signed)
Loop recorder 

## 2015-05-19 LAB — CUP PACEART REMOTE DEVICE CHECK: Date Time Interrogation Session: 20161002233837

## 2015-05-19 NOTE — Progress Notes (Signed)
Carelink summary report received. Battery status OK. Normal device function. No new symptom episodes, tachy episodes, brady, or pause episodes. No new AF episodes. Monthly summary reports. Due to see GT 12/16.

## 2015-06-02 ENCOUNTER — Encounter: Payer: Self-pay | Admitting: Internal Medicine

## 2015-06-04 ENCOUNTER — Encounter: Payer: Self-pay | Admitting: Internal Medicine

## 2015-06-15 ENCOUNTER — Ambulatory Visit (INDEPENDENT_AMBULATORY_CARE_PROVIDER_SITE_OTHER): Payer: Medicaid Other | Admitting: *Deleted

## 2015-06-15 DIAGNOSIS — R55 Syncope and collapse: Secondary | ICD-10-CM | POA: Diagnosis not present

## 2015-06-16 NOTE — Progress Notes (Signed)
Loop recorder 

## 2015-07-10 LAB — CUP PACEART REMOTE DEVICE CHECK: MDC IDC SESS DTM: 20161102000746

## 2015-07-10 NOTE — Progress Notes (Signed)
Carelink summary report received. Battery status OK. Normal device function. No new symptom episodes, tachy episodes, brady, or pause episodes. No new AF episodes. Monthly summary reports and ROV with GT in 07/2015 (recall letter sent). 

## 2015-07-15 ENCOUNTER — Encounter: Payer: Medicaid Other | Admitting: *Deleted

## 2015-07-15 ENCOUNTER — Encounter: Payer: Self-pay | Admitting: Cardiology

## 2015-07-29 ENCOUNTER — Encounter: Payer: Self-pay | Admitting: Cardiology

## 2015-08-05 ENCOUNTER — Encounter: Payer: Self-pay | Admitting: Cardiology

## 2015-08-10 ENCOUNTER — Encounter: Payer: Self-pay | Admitting: *Deleted

## 2015-08-12 ENCOUNTER — Encounter: Payer: Self-pay | Admitting: Cardiology

## 2015-08-13 ENCOUNTER — Encounter: Payer: Self-pay | Admitting: *Deleted

## 2015-08-17 ENCOUNTER — Ambulatory Visit (INDEPENDENT_AMBULATORY_CARE_PROVIDER_SITE_OTHER): Payer: Medicaid Other | Admitting: *Deleted

## 2015-08-17 DIAGNOSIS — R55 Syncope and collapse: Secondary | ICD-10-CM | POA: Diagnosis not present

## 2015-08-19 ENCOUNTER — Encounter: Payer: Self-pay | Admitting: Cardiology

## 2015-08-19 NOTE — Progress Notes (Signed)
Carelink Summary Report / Loop Recorder 

## 2015-08-25 ENCOUNTER — Encounter: Payer: Self-pay | Admitting: Cardiology

## 2015-09-13 ENCOUNTER — Ambulatory Visit (INDEPENDENT_AMBULATORY_CARE_PROVIDER_SITE_OTHER): Payer: Medicaid Other | Admitting: *Deleted

## 2015-09-13 DIAGNOSIS — R55 Syncope and collapse: Secondary | ICD-10-CM | POA: Diagnosis not present

## 2015-09-14 NOTE — Progress Notes (Signed)
Carelink Summary Report / Loop Recorder 

## 2015-09-15 ENCOUNTER — Encounter: Payer: Self-pay | Admitting: *Deleted

## 2015-09-22 LAB — CUP PACEART REMOTE DEVICE CHECK: Date Time Interrogation Session: 20170101010537

## 2015-09-23 ENCOUNTER — Encounter: Payer: Self-pay | Admitting: Internal Medicine

## 2015-10-13 ENCOUNTER — Ambulatory Visit (INDEPENDENT_AMBULATORY_CARE_PROVIDER_SITE_OTHER): Payer: Medicaid Other | Admitting: *Deleted

## 2015-10-13 ENCOUNTER — Encounter: Payer: Self-pay | Admitting: *Deleted

## 2015-10-13 DIAGNOSIS — R55 Syncope and collapse: Secondary | ICD-10-CM

## 2015-10-14 NOTE — Progress Notes (Signed)
Carelink Summary Report / Loop Recorder 

## 2015-10-15 ENCOUNTER — Encounter: Payer: Self-pay | Admitting: Cardiology

## 2015-10-25 LAB — CUP PACEART REMOTE DEVICE CHECK: MDC IDC SESS DTM: 20170302014050

## 2015-10-25 NOTE — Progress Notes (Signed)
Carelink summary report received. Battery status OK. Normal device function. No new symptom episodes, tachy episodes, brady, or pause episodes. No new AF episodes. Monthly summary reports and ROV/PRN 

## 2015-11-12 ENCOUNTER — Ambulatory Visit (INDEPENDENT_AMBULATORY_CARE_PROVIDER_SITE_OTHER): Payer: Medicaid Other | Admitting: *Deleted

## 2015-11-12 DIAGNOSIS — R55 Syncope and collapse: Secondary | ICD-10-CM

## 2015-11-15 NOTE — Progress Notes (Signed)
Carelink Summary Report / Loop Recorder 

## 2015-11-23 LAB — CUP PACEART REMOTE DEVICE CHECK: Date Time Interrogation Session: 20170131011027

## 2015-12-13 ENCOUNTER — Ambulatory Visit (INDEPENDENT_AMBULATORY_CARE_PROVIDER_SITE_OTHER): Payer: Medicaid Other | Admitting: *Deleted

## 2015-12-13 DIAGNOSIS — R55 Syncope and collapse: Secondary | ICD-10-CM | POA: Diagnosis not present

## 2015-12-13 NOTE — Progress Notes (Signed)
Carelink Summary Report / Loop Recorder 

## 2016-01-08 LAB — CUP PACEART REMOTE DEVICE CHECK: MDC IDC SESS DTM: 20170401013554

## 2016-01-08 NOTE — Progress Notes (Signed)
Carelink summary report received. Battery status OK. Normal device function. No new symptom episodes, tachy episodes, brady, or pause episodes. No new AF episodes. Monthly summary reports and ROV/PRN 

## 2016-01-11 ENCOUNTER — Ambulatory Visit (INDEPENDENT_AMBULATORY_CARE_PROVIDER_SITE_OTHER): Payer: Medicaid Other | Admitting: *Deleted

## 2016-01-11 DIAGNOSIS — R55 Syncope and collapse: Secondary | ICD-10-CM

## 2016-01-12 NOTE — Progress Notes (Signed)
Carelink Summary Report / Loop Recorder 

## 2016-01-21 LAB — CUP PACEART REMOTE DEVICE CHECK: MDC IDC SESS DTM: 20170501021056

## 2016-02-10 ENCOUNTER — Ambulatory Visit (INDEPENDENT_AMBULATORY_CARE_PROVIDER_SITE_OTHER): Payer: Medicaid Other | Admitting: *Deleted

## 2016-02-10 DIAGNOSIS — R55 Syncope and collapse: Secondary | ICD-10-CM | POA: Diagnosis not present

## 2016-02-11 NOTE — Progress Notes (Signed)
Carelink Summary Report / Loop Recorder 

## 2016-02-18 LAB — CUP PACEART REMOTE DEVICE CHECK: MDC IDC SESS DTM: 20170531023539

## 2016-03-01 LAB — CUP PACEART REMOTE DEVICE CHECK: MDC IDC SESS DTM: 20170630030608

## 2016-03-13 ENCOUNTER — Ambulatory Visit (INDEPENDENT_AMBULATORY_CARE_PROVIDER_SITE_OTHER): Payer: Medicaid Other | Admitting: *Deleted

## 2016-03-13 DIAGNOSIS — R55 Syncope and collapse: Secondary | ICD-10-CM | POA: Diagnosis not present

## 2016-03-13 NOTE — Progress Notes (Signed)
Carelink Summary Report / Loop Recorder 

## 2016-03-23 LAB — CUP PACEART REMOTE DEVICE CHECK: Date Time Interrogation Session: 20170730033536

## 2016-04-10 ENCOUNTER — Ambulatory Visit (INDEPENDENT_AMBULATORY_CARE_PROVIDER_SITE_OTHER): Payer: Medicaid Other | Admitting: *Deleted

## 2016-04-10 DIAGNOSIS — R55 Syncope and collapse: Secondary | ICD-10-CM | POA: Diagnosis not present

## 2016-04-11 NOTE — Progress Notes (Signed)
Carelink Summary Report / Loop Recorder 

## 2016-05-06 LAB — CUP PACEART REMOTE DEVICE CHECK: MDC IDC SESS DTM: 20170829040536

## 2016-05-06 NOTE — Progress Notes (Signed)
Carelink summary report received. Battery status OK. Normal device function. No new symptom episodes, tachy episodes, brady, or pause episodes. No new AF episodes. Monthly summary reports and ROV/PRN 

## 2016-05-11 ENCOUNTER — Ambulatory Visit (INDEPENDENT_AMBULATORY_CARE_PROVIDER_SITE_OTHER): Payer: Medicaid Other | Admitting: *Deleted

## 2016-05-11 DIAGNOSIS — R55 Syncope and collapse: Secondary | ICD-10-CM | POA: Diagnosis not present

## 2016-05-11 NOTE — Progress Notes (Signed)
Carelink Summary Report / Loop Recorder 

## 2016-06-12 ENCOUNTER — Ambulatory Visit (INDEPENDENT_AMBULATORY_CARE_PROVIDER_SITE_OTHER): Payer: Medicaid Other | Admitting: *Deleted

## 2016-06-12 DIAGNOSIS — R55 Syncope and collapse: Secondary | ICD-10-CM | POA: Diagnosis not present

## 2016-06-12 NOTE — Progress Notes (Signed)
Carelink Summary Report / Loop Recorder 

## 2016-06-23 LAB — CUP PACEART REMOTE DEVICE CHECK
Date Time Interrogation Session: 20170928053534
Implantable Pulse Generator Implant Date: 20151207

## 2016-06-23 NOTE — Progress Notes (Signed)
Carelink summary report received. Battery status OK. Normal device function. No new symptom episodes, tachy episodes, brady, or pause episodes. No new AF episodes. Monthly summary reports and ROV/PRN 

## 2016-07-10 ENCOUNTER — Ambulatory Visit (INDEPENDENT_AMBULATORY_CARE_PROVIDER_SITE_OTHER): Payer: Medicaid Other | Admitting: *Deleted

## 2016-07-10 DIAGNOSIS — R55 Syncope and collapse: Secondary | ICD-10-CM

## 2016-07-10 NOTE — Progress Notes (Signed)
Carelink Summary Report / Loop Recorder 

## 2016-07-13 ENCOUNTER — Telehealth: Payer: Self-pay | Admitting: Cardiology

## 2016-07-13 NOTE — Telephone Encounter (Signed)
Attempted to call pt and request a manual transmission b/c his home monitor has not updated in the last 14 days. No answer and unable to leave a message.  

## 2016-07-18 LAB — CUP PACEART REMOTE DEVICE CHECK
Implantable Pulse Generator Implant Date: 20151207
MDC IDC SESS DTM: 20171028060920

## 2016-07-18 NOTE — Progress Notes (Signed)
Carelink summary report received. Battery status OK. Normal device function. No new symptom episodes, tachy episodes, brady, or pause episodes. No new AF episodes. Monthly summary reports and ROV/PRN 

## 2016-07-28 ENCOUNTER — Encounter: Payer: Self-pay | Admitting: Cardiology

## 2016-08-09 ENCOUNTER — Encounter: Payer: Medicaid Other | Admitting: *Deleted

## 2016-08-11 ENCOUNTER — Encounter: Payer: Self-pay | Admitting: Cardiology

## 2016-08-19 LAB — CUP PACEART REMOTE DEVICE CHECK
Date Time Interrogation Session: 20171127064023
Implantable Pulse Generator Implant Date: 20151207

## 2016-08-19 NOTE — Progress Notes (Signed)
Carelink summary report received. Battery status OK. Normal device function. No new symptom episodes, tachy episodes, brady, or pause episodes. No new AF episodes. Monthly summary reports and ROV/PRN 

## 2016-09-07 ENCOUNTER — Encounter: Payer: Self-pay | Admitting: Cardiology

## 2016-09-22 ENCOUNTER — Encounter: Payer: Self-pay | Admitting: Cardiology

## 2018-03-07 ENCOUNTER — Ambulatory Visit: Payer: Medicaid Other | Admitting: Cardiology

## 2018-05-02 DIAGNOSIS — I10 Essential (primary) hypertension: Secondary | ICD-10-CM

## 2018-05-02 DIAGNOSIS — R911 Solitary pulmonary nodule: Secondary | ICD-10-CM

## 2018-05-02 DIAGNOSIS — I2510 Atherosclerotic heart disease of native coronary artery without angina pectoris: Secondary | ICD-10-CM

## 2018-05-02 DIAGNOSIS — E7800 Pure hypercholesterolemia, unspecified: Secondary | ICD-10-CM

## 2018-05-02 DIAGNOSIS — M79606 Pain in leg, unspecified: Secondary | ICD-10-CM

## 2018-05-02 DIAGNOSIS — Z9119 Patient's noncompliance with other medical treatment and regimen: Secondary | ICD-10-CM

## 2018-05-02 DIAGNOSIS — R079 Chest pain, unspecified: Secondary | ICD-10-CM | POA: Diagnosis not present

## 2018-05-03 DIAGNOSIS — R079 Chest pain, unspecified: Secondary | ICD-10-CM | POA: Diagnosis not present

## 2018-05-03 DIAGNOSIS — Z9119 Patient's noncompliance with other medical treatment and regimen: Secondary | ICD-10-CM | POA: Diagnosis not present

## 2018-05-03 DIAGNOSIS — M79606 Pain in leg, unspecified: Secondary | ICD-10-CM | POA: Diagnosis not present

## 2018-05-03 DIAGNOSIS — I2510 Atherosclerotic heart disease of native coronary artery without angina pectoris: Secondary | ICD-10-CM | POA: Diagnosis not present

## 2018-08-10 ENCOUNTER — Encounter (HOSPITAL_COMMUNITY): Payer: Self-pay

## 2018-08-10 ENCOUNTER — Inpatient Hospital Stay (HOSPITAL_COMMUNITY)
Admission: AD | Admit: 2018-08-10 | Discharge: 2018-08-13 | DRG: 885 | Disposition: A | Discharge status: Home / Self Care | Payer: Medicaid Other | Source: Intra-hospital | Attending: Psychiatry | Admitting: Psychiatry

## 2018-08-10 ENCOUNTER — Other Ambulatory Visit: Payer: Self-pay

## 2018-08-10 DIAGNOSIS — E7800 Pure hypercholesterolemia, unspecified: Secondary | ICD-10-CM | POA: Diagnosis present

## 2018-08-10 DIAGNOSIS — F332 Major depressive disorder, recurrent severe without psychotic features: Secondary | ICD-10-CM | POA: Diagnosis present

## 2018-08-10 DIAGNOSIS — F4310 Post-traumatic stress disorder, unspecified: Secondary | ICD-10-CM | POA: Diagnosis present

## 2018-08-10 DIAGNOSIS — Z62810 Personal history of physical and sexual abuse in childhood: Secondary | ICD-10-CM | POA: Diagnosis present

## 2018-08-10 DIAGNOSIS — I2510 Atherosclerotic heart disease of native coronary artery without angina pectoris: Secondary | ICD-10-CM | POA: Diagnosis present

## 2018-08-10 DIAGNOSIS — I10 Essential (primary) hypertension: Secondary | ICD-10-CM | POA: Diagnosis present

## 2018-08-10 DIAGNOSIS — Z7982 Long term (current) use of aspirin: Secondary | ICD-10-CM

## 2018-08-10 DIAGNOSIS — G4733 Obstructive sleep apnea (adult) (pediatric): Secondary | ICD-10-CM | POA: Diagnosis present

## 2018-08-10 DIAGNOSIS — E785 Hyperlipidemia, unspecified: Secondary | ICD-10-CM | POA: Diagnosis present

## 2018-08-10 DIAGNOSIS — F1010 Alcohol abuse, uncomplicated: Secondary | ICD-10-CM | POA: Diagnosis present

## 2018-08-10 DIAGNOSIS — Z91030 Bee allergy status: Secondary | ICD-10-CM

## 2018-08-10 DIAGNOSIS — Z7984 Long term (current) use of oral hypoglycemic drugs: Secondary | ICD-10-CM

## 2018-08-10 DIAGNOSIS — F419 Anxiety disorder, unspecified: Secondary | ICD-10-CM | POA: Diagnosis not present

## 2018-08-10 DIAGNOSIS — Z888 Allergy status to other drugs, medicaments and biological substances status: Secondary | ICD-10-CM

## 2018-08-10 DIAGNOSIS — E119 Type 2 diabetes mellitus without complications: Secondary | ICD-10-CM | POA: Diagnosis present

## 2018-08-10 DIAGNOSIS — J45909 Unspecified asthma, uncomplicated: Secondary | ICD-10-CM | POA: Diagnosis present

## 2018-08-10 DIAGNOSIS — I252 Old myocardial infarction: Secondary | ICD-10-CM

## 2018-08-10 DIAGNOSIS — Z79899 Other long term (current) drug therapy: Secondary | ICD-10-CM | POA: Diagnosis not present

## 2018-08-10 DIAGNOSIS — G250 Essential tremor: Secondary | ICD-10-CM | POA: Diagnosis present

## 2018-08-10 DIAGNOSIS — Z87891 Personal history of nicotine dependence: Secondary | ICD-10-CM

## 2018-08-10 DIAGNOSIS — G20 Parkinson's disease: Secondary | ICD-10-CM | POA: Diagnosis present

## 2018-08-10 DIAGNOSIS — G4700 Insomnia, unspecified: Secondary | ICD-10-CM | POA: Diagnosis present

## 2018-08-10 DIAGNOSIS — R45851 Suicidal ideations: Secondary | ICD-10-CM | POA: Diagnosis present

## 2018-08-10 DIAGNOSIS — G2581 Restless legs syndrome: Secondary | ICD-10-CM | POA: Diagnosis present

## 2018-08-10 DIAGNOSIS — E781 Pure hyperglyceridemia: Secondary | ICD-10-CM | POA: Diagnosis present

## 2018-08-10 DIAGNOSIS — Z915 Personal history of self-harm: Secondary | ICD-10-CM | POA: Diagnosis not present

## 2018-08-10 LAB — GLUCOSE, CAPILLARY
Glucose-Capillary: 295 mg/dL — ABNORMAL HIGH (ref 70–99)
Glucose-Capillary: 265 mg/dL — ABNORMAL HIGH (ref 70–99)

## 2018-08-10 MED ORDER — VITAMIN B-1 100 MG PO TABS
100.0000 mg | ORAL_TABLET | Freq: Every day | ORAL | Status: DC
  Administered 2018-08-11 – 2018-08-13 (×3): 100 mg via ORAL
  Filled 2018-08-10 (×6): qty 1

## 2018-08-10 MED ORDER — HYDROXYZINE HCL 25 MG PO TABS
25.0000 mg | ORAL_TABLET | Freq: Four times a day (QID) | ORAL | Status: DC | PRN
  Administered 2018-08-10 – 2018-08-12 (×3): 25 mg via ORAL
  Filled 2018-08-10 (×2): qty 1
  Filled 2018-08-10: qty 10

## 2018-08-10 MED ORDER — LORAZEPAM 1 MG PO TABS: 1.0000 mg | ORAL_TABLET | Freq: Four times a day (QID) | ORAL | Status: DC | PRN

## 2018-08-10 MED ORDER — INSULIN ASPART 100 UNIT/ML ~~LOC~~ SOLN
0.0000 [IU] | Freq: Three times a day (TID) | SUBCUTANEOUS | Status: DC
  Administered 2018-08-11 (×2): 3 [IU] via SUBCUTANEOUS
  Administered 2018-08-11: 7 [IU] via SUBCUTANEOUS
  Administered 2018-08-12: 2 [IU] via SUBCUTANEOUS
  Administered 2018-08-12: 5 [IU] via SUBCUTANEOUS

## 2018-08-10 MED ORDER — ACETAMINOPHEN 325 MG PO TABS: 650.0000 mg | ORAL_TABLET | Freq: Four times a day (QID) | ORAL | Status: DC | PRN

## 2018-08-10 MED ORDER — MAGNESIUM HYDROXIDE 400 MG/5ML PO SUSP: 30.0000 mL | Freq: Every day | ORAL | Status: DC | PRN

## 2018-08-10 MED ORDER — LOPERAMIDE HCL 2 MG PO CAPS: 2.0000 mg | ORAL_CAPSULE | ORAL | Status: DC | PRN

## 2018-08-10 MED ORDER — ADULT MULTIVITAMIN W/MINERALS CH
1.0000 | ORAL_TABLET | Freq: Every day | ORAL | Status: DC
  Administered 2018-08-11 – 2018-08-13 (×3): 1 via ORAL
  Filled 2018-08-10 (×2): qty 1
  Filled 2018-08-10: qty 7
  Filled 2018-08-10 (×3): qty 1

## 2018-08-10 MED ORDER — ALUM & MAG HYDROXIDE-SIMETH 200-200-20 MG/5ML PO SUSP: 30.0000 mL | ORAL | Status: DC | PRN

## 2018-08-10 MED ORDER — ONDANSETRON 4 MG PO TBDP: 4.0000 mg | ORAL_TABLET | Freq: Four times a day (QID) | ORAL | Status: DC | PRN

## 2018-08-10 NOTE — [facility] Group Notes (Signed)
[facility] Group Notes:  (Nursing/MHT/Case Management/Adjunct)  Date:  08/10/2018  Time:  8:14 PM  Type of Therapy:  Group Therapy  Participation Level:  Active  Participation Quality:  Sharing  Affect:  Appropriate  Cognitive:  Appropriate  Insight:  Good  Engagement in Group:  Improving  Modes of Intervention:  Discussion and Socialization  Summary of Progress/Problems:  Samuel IdeMichael Vence Williams 08/10/2018, 8:14 PM

## 2018-08-10 NOTE — Progress Notes (Signed)
[facility] Group Notes:  (Nursing/MHT/Case Management/Adjunct)  Date:  08/10/2018  Time:  1330  Type of Therapy:  Nurse Education  Participation Level:  Active  Participation Quality:  Appropriate  Affect:  Appropriate  Cognitive:  Appropriate  Insight:  Appropriate  Engagement in Group:  Engaged  Modes of Intervention:  Activity  Summary of Progress/Problems: [employer]    
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
## 2018-08-10 NOTE — Progress Notes (Signed)
D: Pt was in dayroom upon initial approach.  Pt presents with anxious, depressed affect and mood.  When asked how he is feeling, he states "I'm half asleep."  His goal is to "sleep more."  Pt denies SI/HI, denies hallucinations, denies pain.  Pt has been visible in milieu interacting with peers and staff appropriately.  Pt attended evening group.  Pt asked if he would be getting insulin tonight because his blood sugar was elevated earlier.    A: Introduced self to pt.  Met with pt 1:1.  Actively listened to pt and offered support and encouragement.  PRN medication administered for anxiety.  On-site provider notified of pt's question related to elevated CBG and new orders were placed.  POC was communicated with pt and pt verbalized understanding.  Q15 minute safety checks maintained.  R: Pt is safe on the unit.  Pt is compliant with medication.  Pt verbally contracts for safety.  Will continue to monitor and assess.

## 2018-08-10 NOTE — Tx Team (Signed)
Initial Treatment Plan 08/10/2018 3:24 PM Samuel Williams Lenhoff ZOX:[id number]    PATIENT STRESSORS: Traumatic event   PATIENT STRENGTHS: Motivation for treatment/growth   PATIENT IDENTIFIED PROBLEMS: "I have been drinking beer daily".   Suicidal thoughts without plan.                    DISCHARGE CRITERIA:  Improved stabilization in mood, thinking, and/or behavior  PRELIMINARY DISCHARGE PLAN: Return to previous living arrangement  PATIENT/FAMILY INVOLVEMENT: This treatment plan has been presented to and reviewed with the patient, Samuel Williams Dufault.  The patient and family have been given the opportunity to ask questions and make suggestions.  Daune Perchanika L Coran Dipaola, RN 08/10/2018, 3:24 PM

## 2018-08-11 DIAGNOSIS — F419 Anxiety disorder, unspecified: Secondary | ICD-10-CM

## 2018-08-11 DIAGNOSIS — G4700 Insomnia, unspecified: Secondary | ICD-10-CM

## 2018-08-11 DIAGNOSIS — F332 Major depressive disorder, recurrent severe without psychotic features: Principal | ICD-10-CM

## 2018-08-11 DIAGNOSIS — F1010 Alcohol abuse, uncomplicated: Secondary | ICD-10-CM

## 2018-08-11 LAB — GLUCOSE, CAPILLARY
Glucose-Capillary: 243 mg/dL — ABNORMAL HIGH (ref 70–99)
Glucose-Capillary: 205 mg/dL — ABNORMAL HIGH (ref 70–99)
Glucose-Capillary: 324 mg/dL — ABNORMAL HIGH (ref 70–99)
Glucose-Capillary: 195 mg/dL — ABNORMAL HIGH (ref 70–99)

## 2018-08-11 LAB — LIPID PANEL
Cholesterol: 253 mg/dL — ABNORMAL HIGH (ref 0–200)
HDL: 38 mg/dL — ABNORMAL LOW (ref >40)
LDL Cholesterol: UNDETERMINED mg/dL (ref 0–99)
Total CHOL/HDL Ratio: 6.7 RATIO
Triglycerides: 470 mg/dL — ABNORMAL HIGH (ref <150)
VLDL: UNDETERMINED mg/dL (ref 0–40)

## 2018-08-11 LAB — COMPREHENSIVE METABOLIC PANEL
ALBUMIN: 3.9 g/dL (ref 3.5–5.0)
ALT: 89 U/L — ABNORMAL HIGH (ref 0–44)
AST: 72 U/L — ABNORMAL HIGH (ref 15–41)
Alkaline Phosphatase: 106 U/L (ref 38–126)
Anion gap: 10 (ref 5–15)
BUN: 16 mg/dL (ref 6–20)
CO2: 22 mmol/L (ref 22–32)
Calcium: 9.2 mg/dL (ref 8.9–10.3)
Chloride: 103 mmol/L (ref 98–111)
Creatinine, Ser: 0.97 mg/dL (ref 0.61–1.24)
GFR calc Af Amer: >60 mL/min (ref >60)
GFR calc non Af Amer: >60 mL/min (ref >60)
GLUCOSE: 335 mg/dL — AB (ref 70–99)
Potassium: 4 mmol/L (ref 3.5–5.1)
SODIUM: 135 mmol/L (ref 135–145)
Total Bilirubin: 0.5 mg/dL (ref 0.3–1.2)
Total Protein: 6.3 g/dL — ABNORMAL LOW (ref 6.5–8.1)

## 2018-08-11 LAB — HEMOGLOBIN A1C
Hgb A1c MFr Bld: 6.7 % — ABNORMAL HIGH (ref 4.8–5.6)
Mean Plasma Glucose: 145.59 mg/dL

## 2018-08-11 LAB — TSH: TSH: 6.466 u[IU]/mL — ABNORMAL HIGH (ref 0.350–4.500)

## 2018-08-11 MED ORDER — OMEGA-3-ACID ETHYL ESTERS 1 G PO CAPS
1.0000 g | ORAL_CAPSULE | Freq: Two times a day (BID) | ORAL | Status: DC
  Administered 2018-08-11 – 2018-08-13 (×4): 1 g via ORAL
  Filled 2018-08-11 (×4): qty 1
  Filled 2018-08-11: qty 14
  Filled 2018-08-11 (×3): qty 1
  Filled 2018-08-11: qty 14
  Filled 2018-08-11: qty 1

## 2018-08-11 MED ORDER — ASPIRIN EC 81 MG PO TBEC
81.0000 mg | DELAYED_RELEASE_TABLET | Freq: Every day | ORAL | Status: DC
  Administered 2018-08-11 – 2018-08-13 (×3): 81 mg via ORAL
  Filled 2018-08-11: qty 7
  Filled 2018-08-11 (×2): qty 1
  Filled 2018-08-11: qty 7
  Filled 2018-08-11 (×2): qty 1

## 2018-08-11 MED ORDER — METOPROLOL TARTRATE 25 MG PO TABS
25.0000 mg | ORAL_TABLET | Freq: Two times a day (BID) | ORAL | Status: DC
  Administered 2018-08-11 – 2018-08-13 (×5): 25 mg via ORAL
  Filled 2018-08-11 (×6): qty 1
  Filled 2018-08-11: qty 14
  Filled 2018-08-11: qty 1
  Filled 2018-08-11: qty 14

## 2018-08-11 MED ORDER — ATORVASTATIN CALCIUM 40 MG PO TABS
40.0000 mg | ORAL_TABLET | Freq: Every day | ORAL | Status: DC
  Administered 2018-08-11 – 2018-08-12 (×2): 40 mg via ORAL
  Filled 2018-08-11: qty 7
  Filled 2018-08-11 (×3): qty 1

## 2018-08-11 MED ORDER — TRAZODONE HCL 100 MG PO TABS
100.0000 mg | ORAL_TABLET | Freq: Every evening | ORAL | Status: DC | PRN
  Administered 2018-08-11 – 2018-08-12 (×2): 100 mg via ORAL
  Filled 2018-08-11 (×4): qty 1

## 2018-08-11 MED ORDER — OMEGA-3-ACID ETHYL ESTERS 1 G PO CAPS: 1.0000 g | ORAL_CAPSULE | Freq: Two times a day (BID) | ORAL | Status: DC

## 2018-08-11 MED ORDER — PRIMIDONE 50 MG PO TABS
50.0000 mg | ORAL_TABLET | Freq: Every day | ORAL | Status: DC
  Administered 2018-08-11 – 2018-08-12 (×2): 50 mg via ORAL
  Filled 2018-08-11 (×3): qty 1
  Filled 2018-08-11: qty 7

## 2018-08-11 MED ORDER — DULOXETINE HCL 20 MG PO CPEP
20.0000 mg | ORAL_CAPSULE | Freq: Every day | ORAL | Status: DC
  Administered 2018-08-11 – 2018-08-13 (×3): 20 mg via ORAL
  Filled 2018-08-11: qty 7
  Filled 2018-08-11 (×4): qty 1

## 2018-08-11 MED ORDER — NITROGLYCERIN 0.4 MG SL SUBL: 0.4000 mg | SUBLINGUAL_TABLET | SUBLINGUAL | Status: DC | PRN

## 2018-08-11 NOTE — Progress Notes (Signed)
Patient did attend the evening speaker AA meeting.  
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
## 2018-08-11 NOTE — [facility] Suicide Risk Assessment (Signed)
[facility] INPATIENT:  Family/Significant Other Suicide Prevention Education  Suicide Prevention Education:  Education Completed; wife Samuel Williams [phone],  (name of family member/significant other) has been identified by the patient as the family member/significant other with whom the patient will be residing, and identified as the person(s) who will aid the patient in the event of a mental health crisis (suicidal ideations/suicide attempt).  With written consent from the patient, the family member/significant other has been provided the following suicide prevention education, prior to the and/or following the discharge of the patient.  The suicide prevention education provided includes the following:  Suicide risk factors  Suicide prevention and interventions  National Suicide Hotline telephone number  [facility] assessment telephone number  [facility] Assistance 911  [facility] and/or [facility]idential [facility] telephone number  Request made of family/significant other to:  Remove weapons (e.g., guns, rifles, knives), all items previously/currently identified as safety concern.    Remove drugs/medications (over-the-counter, prescriptions, illicit drugs), all items previously/currently identified as a safety concern.  The family member/significant other verbalizes understanding of the suicide prevention education information provided.  The family member/significant other agrees to remove the items of safety concern listed above.  Wife denies any guns or weapons in the home. Wife responded "mhm" a lot or other one word responses. Wife inquired when he could come home. CSW explained that a discharge date has not been set as he just arrived but that the MD will discuss discharge with patient and the patient will be able to let her know. She reports being knowledgeable about suicide prevention and resources in the community.   Samuel CleverlyStephanie Williams  Samuel Williams 08/11/2018, 10:03 AM

## 2018-08-11 NOTE — [facility] Group Notes (Addendum)
[facility] Group Notes:  (Nursing/MHT/Case Management/Adjunct)  Date:  08/11/2018  Time:  4:19 PM  Type of Therapy:  Nurse Education  Participation Level:  Active  Participation Quality:  Appropriate and Attentive  Affect:  Appropriate  Cognitive:  Alert and Appropriate  Insight:  Appropriate and Good  Engagement in Group:  Engaged  Modes of Intervention:  Activity and Discussion  Summary of Progress/Problems: In this group, patients were educated on how to express their needs to others in a healthy way. They explored unhealthy communication and new methods to manage stressful relationship situations. Patients discussed stress management skills and how to incorporate them into their daily life. The patient was active and participated readily.   Kirstie MirzaJonathan C Pernell Dikes 08/11/2018, 4:19 PM
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
## 2018-08-11 NOTE — Plan of Care (Signed)
D: Patient presents calm and cooperative with a blunted affect. He slept well, and did not request medication to help with sleep. His appetite is good, energy high and concentration good. He rates his depression and hopelessness 0/10 and anxiety 2/10. He denies physical symptoms or withdrawal complaints. Patient denies SI/HI/AVH.  A: Patient checked q15 min, and checks reviewed. Reviewed medication changes with patient and educated on side effects. Educated patient on importance of attending group therapy sessions and educated on several coping skills. Encouarged participation in milieu through recreation therapy and attending meals with peers. Support and encouragement provided. Fluids offered. R: Patient receptive to education on medications, and is medication compliant. Patient contracts for safety on the unit. His goal today "Understanding why" and "reflect."

## 2018-08-11 NOTE — [facility] Group Notes (Signed)
[facility] LCSW Group Therapy Note  08/11/2018  10:00-11:00AM  Type of Therapy and Topic:  Group Therapy:  Adding Supports Including Being Your Own Support  Participation Level:  Active   Description of Group:  Patients in this group were introduced to the concept that additional supports including self-support are an essential part of recovery.  A song entitled "I Need Help!" was played and a group discussion was held in reaction to the idea of needing to add supports.  A song entitled "My Own Hero" was played and a group discussion ensued in which patients stated they could relate to the song and it inspired them to realize they have be willing to help themselves in order to succeed, because other people cannot achieve sobriety or stability for them.  We discussed adding a variety of healthy supports to address the various needs in their lives.  A song was played called "I Know Where I've Been" toward the end of group and used to conduct an inspirational wrap-up to group of remembering how far they have already come in their journey.  Therapeutic Goals: 1)  demonstrate the importance of being a part of one's own support system 2)  discuss reasons people in one's life may eventually be unable to be continually supportive  3)  identify the patient's current support system and   4)  elicit commitments to add healthy supports and to become more conscious of being self-supportive   Summary of Patient Progress:  The patient expressed that his healthy support include his family and any information he can get that helps in his recovery, while his unhealthy supports include "that dark colored bottle with the red label."  He expressed a wish that there could be a grocery store without alcohol in it.  He stated that one thing he needs to change about his support system is to actually allow himself to rely on other supports in his life instead of trying to do things on his own.  He is interested in a therapist to  help him work on this.   Therapeutic Modalities:   Motivational Interviewing Activity  Lynnell ChadMareida J Grossman-Orr

## 2018-08-12 LAB — CBC WITH DIFFERENTIAL/PLATELET
Abs Immature Granulocytes: 0.09 10*3/uL — ABNORMAL HIGH (ref 0.00–0.07)
Basophils Absolute: 0.1 10*3/uL (ref 0.0–0.1)
Basophils Relative: 1 %
Eosinophils Absolute: 0.6 10*3/uL — ABNORMAL HIGH (ref 0.0–0.5)
Eosinophils Relative: 5 %
HCT: 49.2 % (ref 39.0–52.0)
Hemoglobin: 16.3 g/dL (ref 13.0–17.0)
Immature Granulocytes: 1 %
LYMPHS ABS: 3 10*3/uL (ref 0.7–4.0)
Lymphocytes Relative: 26 %
MCH: 32.1 pg (ref 26.0–34.0)
MCHC: 33.1 g/dL (ref 30.0–36.0)
MCV: 96.9 fL (ref 80.0–100.0)
MONOS PCT: 10 %
Monocytes Absolute: 1.2 10*3/uL — ABNORMAL HIGH (ref 0.1–1.0)
Neutro Abs: 6.7 10*3/uL (ref 1.7–7.7)
Neutrophils Relative %: 57 %
Platelets: 269 10*3/uL (ref 150–400)
RBC: 5.08 MIL/uL (ref 4.22–5.81)
RDW: 12.8 % (ref 11.5–15.5)
WBC: 11.8 10*3/uL — ABNORMAL HIGH (ref 4.0–10.5)
nRBC: 0 % (ref 0.0–0.2)

## 2018-08-12 LAB — GLUCOSE, CAPILLARY
GLUCOSE-CAPILLARY: 270 mg/dL — AB (ref 70–99)
Glucose-Capillary: 199 mg/dL — ABNORMAL HIGH (ref 70–99)
Glucose-Capillary: 262 mg/dL — ABNORMAL HIGH (ref 70–99)
Glucose-Capillary: 374 mg/dL — ABNORMAL HIGH (ref 70–99)
Glucose-Capillary: 321 mg/dL — ABNORMAL HIGH (ref 70–99)

## 2018-08-12 LAB — T4, FREE: Free T4: 0.78 ng/dL — ABNORMAL LOW (ref 0.82–1.77)

## 2018-08-12 LAB — TSH: TSH: 4.974 u[IU]/mL — ABNORMAL HIGH (ref 0.350–4.500)

## 2018-08-12 MED ORDER — INSULIN ASPART 100 UNIT/ML ~~LOC~~ SOLN
0.0000 [IU] | Freq: Every day | SUBCUTANEOUS | Status: DC
  Administered 2018-08-12: 5 [IU] via SUBCUTANEOUS

## 2018-08-12 MED ORDER — INSULIN ASPART 100 UNIT/ML ~~LOC~~ SOLN
0.0000 [IU] | Freq: Three times a day (TID) | SUBCUTANEOUS | Status: DC
  Administered 2018-08-12: 8 [IU] via SUBCUTANEOUS
  Administered 2018-08-13: 11 [IU] via SUBCUTANEOUS

## 2018-08-12 MED ORDER — LIVING WELL WITH DIABETES BOOK
Freq: Once | Status: DC
  Filled 2018-08-12 (×2): qty 1

## 2018-08-12 MED ORDER — NICOTINE POLACRILEX 2 MG MT GUM
2.0000 mg | CHEWING_GUM | OROMUCOSAL | Status: DC | PRN
  Administered 2018-08-12 – 2018-08-13 (×2): 2 mg via ORAL

## 2018-08-12 NOTE — Progress Notes (Signed)
Recreation Therapy Notes  Date: 12.30.19 Time: 0930 Location: [address] Dayroom  Group Topic: Stress Management  Goal Area(s) Addresses:  Patient will identify stress management techniques. Patient will identify benefits of using stress management techniques post d/c.  Intervention: Stress Management  Activity :  Guided Imagery.  LRT introduced the stress management technique of guided imagery.  LRT read a scrip that guided patients on the beach to relax by the peacefully waves.  Education:  Stress Management, Discharge Planning.   Education Outcome: Acknowledges Education  Clinical Observations/Feedback:  Pt did not attend group.     Caroll RancherMarjette Luria Rosario, LRT/CTRS         Caroll RancherLindsay, Cleophas Yoak A 08/12/2018 10:52 AM

## 2018-08-12 NOTE — Progress Notes (Signed)
Inpatient Diabetes Program Recommendations  AACE/ADA: New Consensus Statement on Inpatient Glycemic Control (2015)  Target Ranges:  Prepandial:   less than 140 mg/dL      Peak postprandial:   less than 180 mg/dL (1-2 hours)      Critically ill patients:  140 - 180 mg/dL   Lab Results  Component Value Date   GLUCAP 199 (H) 08/12/2018   HGBA1C 6.7 (H) 08/11/2018    Review of Glycemic Control  Diabetes history: None per pt Outpatient Diabetes medications: None Current orders for Inpatient glycemic control: Novolog 0-9 units tidwc  HgbA1C - 6.7% - indicates diagnosis of DM2.  Inpatient Diabetes Program Recommendations:     Increase Novolog to 0-15 units tidwc and ADD HS Will order Living Well with Diabetes book. Pt will need to f/u with PCP for management of his new-onset DM.  Continue to follow trends.   Thank you. Samuel Williams, RD, LDN, CDE Inpatient Diabetes Coordinator [phone]

## 2018-08-12 NOTE — [facility] Group Notes (Signed)
LCSW Group Therapy Note   08/12/2018 1:15pm   Type of Therapy and Topic:  Group Therapy:  Overcoming Obstacles   Participation Level:  Active   Description of Group:    In this group patients will be encouraged to explore what they see as obstacles to their own wellness and recovery. They will be guided to discuss their thoughts, feelings, and behaviors related to these obstacles. The group will process together ways to cope with barriers, with attention given to specific choices patients can make. Each patient will be challenged to identify changes they are motivated to make in order to overcome their obstacles. This group will be process-oriented, with patients participating in exploration of their own experiences as well as giving and receiving support and challenge from other group members.   Therapeutic Goals: 1. Patient will identify personal and current obstacles as they relate to admission. 2. Patient will identify barriers that currently interfere with their wellness or overcoming obstacles.  3. Patient will identify feelings, thought process and behaviors related to these barriers. 4. Patient will identify two changes they are willing to make to overcome these obstacles:      Summary of Patient Progress  Pt presents with irritable mood and anxious affect. He spent the entirety of group asking to speak with the doctor in order to discharge. He was not responsive to redirection and expressed that he only had transportation available today. CSW assured pt that MD would see him after group to discuss his concerns but ultimately, his discharge would be up to the doctor's discretion. Pt reported no other obstacles. CSW continuing to assess--letting patient know that we could work with him on assisting with transportation if necessary.    Therapeutic Modalities:   Cognitive Behavioral Therapy Solution Focused Therapy Motivational Interviewing Relapse Prevention Therapy  Rona RavensHeather S  Mace Weinberg, LCSW 08/12/2018 11:48 AM

## 2018-08-12 NOTE — Tx Team (Signed)
Interdisciplinary Treatment and Diagnostic Plan Update  08/12/2018 Time of Session: 1610RU Samuel Williams MRN: [id number]  Principal Diagnosis: MDD (major depressive disorder), recurrent episode, severe (HCC)  Secondary Diagnoses: Principal Problem:   MDD (major depressive disorder), recurrent episode, severe (HCC) Active Problems:   ETOH abuse   Current Medications:  Current Facility-Administered Medications  Medication Dose Route Frequency Provider Last Rate Last Dose  . alum & mag hydroxide-simeth (MAALOX/MYLANTA) 200-200-20 MG/5ML suspension 30 mL  30 mL Oral Q4H PRN Rosario Adie, Juel Burrow, FNP      . aspirin EC tablet 81 mg  81 mg Oral Daily Cobos, Rockey Situ, MD   81 mg at 08/12/18 0747  . atorvastatin (LIPITOR) tablet 40 mg  40 mg Oral q1800 Cobos, Rockey Situ, MD   40 mg at 08/11/18 1827  . DULoxetine (CYMBALTA) DR capsule 20 mg  20 mg Oral Daily Cobos, Rockey Situ, MD   20 mg at 08/12/18 0747  . hydrOXYzine (ATARAX/VISTARIL) tablet 25 mg  25 mg Oral Q6H PRN Nira Conn A, NP   25 mg at 08/11/18 2134  . insulin aspart (novoLOG) injection 0-9 Units  0-9 Units Subcutaneous TID WC Jackelyn Poling, NP   2 Units at 08/12/18 [phone]  . loperamide (IMODIUM) capsule 2-4 mg  2-4 mg Oral PRN Nira Conn A, NP      . LORazepam (ATIVAN) tablet 1 mg  1 mg Oral Q6H PRN Nira Conn A, NP      . magnesium hydroxide (MILK OF MAGNESIA) suspension 30 mL  30 mL Oral Daily PRN Rosario Adie, Juel Burrow, FNP      . metoprolol tartrate (LOPRESSOR) tablet 25 mg  25 mg Oral BID Cobos, Rockey Situ, MD   25 mg at 08/12/18 [phone]  . multivitamin with minerals tablet 1 tablet  1 tablet Oral Daily Nira Conn A, NP   1 tablet at 08/12/18 0747  . nitroGLYCERIN (NITROSTAT) SL tablet 0.4 mg  0.4 mg Sublingual Q5 min PRN Cobos, Rockey Situ, MD      . omega-3 acid ethyl esters (LOVAZA) capsule 1 g  1 g Oral BID Cobos, Rockey Situ, MD   1 g at 08/12/18 0747  . ondansetron (ZOFRAN-ODT) disintegrating tablet 4 mg  4 mg  Oral Q6H PRN Nira Conn A, NP      . primidone (MYSOLINE) tablet 50 mg  50 mg Oral QHS Cobos, Rockey Situ, MD   50 mg at 08/11/18 2134  . thiamine (VITAMIN B-1) tablet 100 mg  100 mg Oral Daily Nira Conn A, NP   100 mg at 08/12/18 0747  . traZODone (DESYREL) tablet 100 mg  100 mg Oral QHS PRN,MR X 1 Nira Conn A, NP   100 mg at 08/11/18 2134   PTA Medications: Medications Prior to Admission  Medication Sig Dispense Refill Last Dose  . EPINEPHrine 0.15 MG/0.15ML IJ injection Inject 0.15 mg into the muscle as needed for anaphylaxis (bees, mushrooms).     . metoprolol tartrate (LOPRESSOR) 25 MG tablet Take 1 tablet by mouth 2 (two) times daily.     Marland Kitchen aspirin EC 81 MG tablet Take 1 tablet by mouth daily.     Marland Kitchen atorvastatin (LIPITOR) 40 MG tablet Take 40 mg by mouth daily.     . meloxicam (MOBIC) 7.5 MG tablet Take 7.5 mg by mouth daily.    Past Month at Unknown time  . nitroGLYCERIN (NITROSTAT) 0.4 MG SL tablet Place 0.4 mg under the tongue every 5 (five) minutes as needed  for chest pain.   Past Month at Unknown time  . primidone (MYSOLINE) 50 MG tablet Take 75 mg by mouth daily.       Patient Stressors: Traumatic event  Patient Strengths: Motivation for treatment/growth  Treatment Modalities: Medication Management, Group therapy, Case management,  1 to 1 session with clinician, Psychoeducation, Recreational therapy.   Physician Treatment Plan for Primary Diagnosis: MDD (major depressive disorder), recurrent episode, severe (HCC) Long Term Goal(s): Improvement in symptoms so as ready for discharge Improvement in symptoms so as ready for discharge   Short Term Goals: Ability to identify changes in lifestyle to reduce recurrence of condition will improve Ability to disclose and discuss suicidal ideas Ability to identify and develop effective coping behaviors will improve Ability to disclose and discuss suicidal ideas Ability to demonstrate self-control will improve Ability to  identify triggers associated with substance abuse/mental health issues will improve  Medication Management: Evaluate patient's response, side effects, and tolerance of medication regimen.  Therapeutic Interventions: 1 to 1 sessions, Unit Group sessions and Medication administration.  Evaluation of Outcomes: Progressing  Physician Treatment Plan for Secondary Diagnosis: Principal Problem:   MDD (major depressive disorder), recurrent episode, severe (HCC) Active Problems:   ETOH abuse  Long Term Goal(s): Improvement in symptoms so as ready for discharge Improvement in symptoms so as ready for discharge   Short Term Goals: Ability to identify changes in lifestyle to reduce recurrence of condition will improve Ability to disclose and discuss suicidal ideas Ability to identify and develop effective coping behaviors will improve Ability to disclose and discuss suicidal ideas Ability to demonstrate self-control will improve Ability to identify triggers associated with substance abuse/mental health issues will improve     Medication Management: Evaluate patient's response, side effects, and tolerance of medication regimen.  Therapeutic Interventions: 1 to 1 sessions, Unit Group sessions and Medication administration.  Evaluation of Outcomes: Progressing   RN Treatment Plan for Primary Diagnosis: MDD (major depressive disorder), recurrent episode, severe (HCC) Long Term Goal(s): Knowledge of disease and therapeutic regimen to maintain health will improve  Short Term Goals: Ability to remain free from injury will improve, Ability to demonstrate self-control, Ability to disclose and discuss suicidal ideas and Ability to identify and develop effective coping behaviors will improve  Medication Management: RN will administer medications as ordered by provider, will assess and evaluate patient's response and provide education to patient for prescribed medication. RN will report any adverse and/or  side effects to prescribing provider.  Therapeutic Interventions: 1 on 1 counseling sessions, Psychoeducation, Medication administration, Evaluate responses to treatment, Monitor vital signs and CBGs as ordered, Perform/monitor CIWA, COWS, AIMS and Fall Risk screenings as ordered, Perform wound care treatments as ordered.  Evaluation of Outcomes: Progressing   LCSW Treatment Plan for Primary Diagnosis: MDD (major depressive disorder), recurrent episode, severe (HCC) Long Term Goal(s): Safe transition to appropriate next level of care at discharge, Engage patient in therapeutic group addressing interpersonal concerns.  Short Term Goals: Engage patient in aftercare planning with referrals and resources, Facilitate patient progression through stages of change regarding substance use diagnoses and concerns and Identify triggers associated with mental health/substance abuse issues  Therapeutic Interventions: Assess for all discharge needs, 1 to 1 time with Social worker, Explore available resources and support systems, Assess for adequacy in community support network, Educate family and significant other(s) on suicide prevention, Complete Psychosocial Assessment, Interpersonal group therapy.  Evaluation of Outcomes: Progressing   Progress in Treatment: Attending groups: No. New to unit. Continuing to  assess.  Participating in groups: No. Taking medication as prescribed: Yes. Toleration medication: Yes. Family/Significant other contact made: Yes, individual(s) contacted:  pt's wife Patient understands diagnosis: Yes. Discussing patient identified problems/goals with staff: Yes. Medical problems stabilized or resolved: Yes. Denies suicidal/homicidal ideation: Yes. Issues/concerns per patient self-inventory: No. Other: n/a   New problem(s) identified: No, Describe:  n/a  New Short Term/Long Term Goal(s): detox, medication management for mood stabilization; elimination of SI thoughts;  development of comprehensive mental wellness/sobriety plan.   Patient Goals:  "to stop drinking beer and get help with my depression and thoughts to kill myself."   Discharge Plan or Barriers: CSW assessing--pt plans to return home; [facility] [place]. Pt interested in therapy referral. [facility] pamphlet, Mobile Crisis information, and AA/NA information provided to patient for additional community support and resources.   Reason for Continuation of Hospitalization: Anxiety Depression Medication stabilization Withdrawal symptoms  Estimated Length of Stay: Wed, 08/14/18  Attendees: Patient: Stacie AcresMichael Williams 08/12/2018 8:48 AM  Physician: Dr. Jama Flavorsobos MD 08/12/2018 8:48 AM  Nursing: Selena BattenKim RN; [place] RN 08/12/2018 8:48 AM  RN Care Manager:x 08/12/2018 8:48 AM  Social Worker: Corrie MckusickHeather Somara Frymire LCSW 08/12/2018 8:48 AM  Recreational Therapist: x 08/12/2018 8:48 AM  Other: Marciano SequinJanet Sykes NP 08/12/2018 8:48 AM  Other:  08/12/2018 8:48 AM  Other: 08/12/2018 8:48 AM    Scribe for Treatment Team: Rona RavensHeather S Esco Joslyn, LCSW 08/12/2018 8:48 AM

## 2018-08-12 NOTE — Progress Notes (Addendum)
Patient ID: Samuel Williams, male   DOB: 1971-03-10, 47 y.o.   MRN: [id number]   D. D: Patient denies SI/HI and auditory and visual hallucinations. Patient has a depressed mood and affect. Patient goal is to see the MD and go home he reports no depression or anxiety.  A: Patient given emotional support from RN. Patient given medications per MD orders. Patient encouraged to attend groups and unit activities. Patient encouraged to come to staff with any questions or concerns.  R: Patient remains cooperative and appropriate. Will continue to monitor patient for safety.

## 2018-08-12 NOTE — Progress Notes (Signed)
D: Pt denies SI/HI/AVH. Pt is pleasant and cooperative. Pt visible on the unit joking with staff/ peers . Pt stated he was feeling better " great day" A: Pt was offered support and encouragement. Pt was given scheduled medications. Pt was encourage to attend groups. Q 15 minute checks were done for safety.  R:Pt attends groups and interacts well with peers and staff. Pt is taking medication. Pt has no complaints.Pt receptive to treatment and safety maintained on unit.  Problem: Coping: Goal: Coping ability will improve Outcome: Progressing   Problem: Medication: Goal: Compliance with prescribed medication regimen will improve Outcome: Progressing   Problem: Education: Goal: Mental status will improve Outcome: Progressing   Problem: Activity: Goal: Interest or engagement in activities will improve Outcome: Progressing   Problem: Activity: Goal: Sleeping patterns will improve Outcome: Progressing   Problem: Activity: Goal: Interest or engagement in leisure activities will improve Outcome: Progressing

## 2018-08-12 NOTE — Progress Notes (Signed)
Adult Psychoeducational Group Note  Date:  08/12/2018 Time:  9:56 PM  Group Topic/Focus:  Wrap-Up Group:   The focus of this group is to help patients review their daily goal of treatment and discuss progress on daily workbooks.  Participation Level:  Active  Participation Quality:  Appropriate  Affect:  Appropriate  Cognitive:  Appropriate  Insight: Appropriate  Engagement in Group:  Engaged  Modes of Intervention:  Discussion  Additional Comments:  Pt stated goal was to not bring up the past because it makes him angry.  Pt stated he did meet his goal.  Pt rated the day at a 8/10.  Samuel Williams 08/12/2018, 9:56 PM

## 2018-08-12 NOTE — Progress Notes (Signed)
Adult Psychoeducational Group Note  Date:  08/12/2018 Time:  10:23 AM  Group Topic/Focus:  Goals Group:   The focus of this group is to help patients establish daily goals to achieve during treatment and discuss how the patient can incorporate goal setting into their daily lives to aide in recovery.  Participation Level:  Active  Participation Quality:  Appropriate, Attentive and Sharing  Affect:  Flat  Cognitive:  Alert and Appropriate  Insight: Appropriate and Good  Engagement in Group:  Engaged  Modes of Intervention:  Activity, Clarification, Discussion, Education and Support  Additional Comments: Pt attended the goals group and shared that he had been sober and stopped going to [employer] meetings.  Pt shared that he had good support from his children and wife.  Pt has plans to begin AA meetings again once discharged.  Pt told the group that he not only is an alcoholic but has PTSD.  He shared with the group about doing a 15 point medication to assist in his PTSD management.  Pt denied S/I and H/I and no physical ailments were reported.  Pt stated that he was confident todischarge today if that happens.  Samuel Williams, Samuel Williams  MHT/LRT/CTRS 08/12/2018, 10:23 AM

## 2018-08-12 NOTE — Progress Notes (Signed)
D: Pt denies SI/HI/AVH. Pt is pleasant and cooperative. Pt stated he felt "really good", pt visible on the unit this evening A: Pt was offered support and encouragement. Pt was given scheduled medications. Pt was encourage to attend groups. Q 15 minute checks were done for safety.   R:Pt attends groups and interacts well with peers and staff. Pt is taking medication. Pt has no complaints.Pt receptive to treatment and safety maintained on unit.   Problem: Coping: Goal: Coping ability will improve Outcome: Progressing   Problem: Education: Goal: Emotional status will improve Outcome: Progressing

## 2018-08-12 NOTE — Progress Notes (Addendum)
[facility] MD Progress Note  08/12/2018 8:31 AM Samuel Williams  MRN:  [id number] Subjective:  "I'm doing great."  Samuel Williams presents with bright affect and reports good mood. Reports he slept better than he has in a long time after trazodone admin last night. Denies depression, anxiety, or SI. Reports that "being able to think clearly without outside influence" is helping him in the hospital. Appears future-oriented. States that he has spent too much time recently trying to help other people, particularly his adult children, and has been neglecting himself. His two adult children live with him and wife, and he plans to set more boundaries with them on discharge and "focus on me" by doing things he enjoys like fishing. Also states intent to follow up with outpatient mental health, PCP regarding new diagnosis of diabetes, and AA to manage alcoholism. Denies symptoms of withdrawal from alcohol. Denies any history of withdrawal symptoms when stopping ETOH.   From admission H&P: Samuel Williams is a 47 year old male with past history of alcohol use disorder, PTSD, MDD, HTN, HLD, CAD, MI x 6, essential tremors, and asthma who presents for treatment of suicidal ideation with alcohol abuse. He has a long history of depression since childhood that has worsened over the last two months. He presented to the ED at [facility] voluntarily with a friend for suicidal ideation. He reports insomnia, with 2-3 hours of sleep per night. Also reports fatigue and reduced appetite (one meal per day at most). Reports history of alcohol abuse for years. Recently he reports almost daily consumption of 12-24 beers. He states he will sometimes go several days without drinking, and denies any history of DTs, seizures or severe withdrawal symptoms. Denies any other drug use. UDS is negative. Today he denies SI, HI, AVH.  Principal Problem: MDD (major depressive disorder), recurrent episode, severe (HCC) Diagnosis: Principal Problem:   MDD  (major depressive disorder), recurrent episode, severe (HCC) Active Problems:   ETOH abuse  Total Time spent with patient: 15 minutes  Past Psychiatric History: See admission H&P  Past Medical History:  Past Medical History:  Diagnosis Date  . Backache, unspecified   . Bipolar 1 disorder (HCC)   . Diabetes mellitus without complication (HCC)   . Hyperlipidemia   . Major depression   . MI (myocardial infarction) (HCC) x5   per report, normal catheterization 2015  . Obstructive sleep apnea (adult) (pediatric)   . Other and unspecified hyperlipidemia   . Pain in limb   . Parkinson disease (HCC)   . PTSD (post-traumatic stress disorder)   . Restless legs syndrome (RLS)   . Unspecified asthma(493.90)   . Unspecified essential hypertension   . 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 Unspecified vitamin D deficiency     Past Surgical History:  Procedure Laterality Date  . CARDIAC CATHETERIZATION  2015   non obstructive CAD, normal LV function  . CHOLECYSTECTOMY    . CHOLECYSTECTOMY    . KNEE SURGERY    . LEFT HEART CATHETERIZATION WITH CORONARY ANGIOGRAM N/A 10/28/2013   Procedure: LEFT HEART CATHETERIZATION WITH CORONARY ANGIOGRAM;  Surgeon: Peter M Swaziland, MD;  Location: [facility] CATH LAB;  Service: Cardiovascular;  Laterality: N/A;  . LOOP RECORDER IMPLANT N/A 07/20/2014   Procedure: LOOP RECORDER IMPLANT;  Surgeon: Marinus Maw, MD;  Location: [facility] CATH LAB;  Service: Cardiovascular;  Laterality: N/A;  . TONSILLECTOMY     Family History:  Family History  Problem Relation Age of Onset  . Hypertension  Father   . Heart disease Father   . Hypercholesterolemia Father   . Thyroid disease Father   . Stroke Father   . Cancer Father   . Hypertension Paternal Grandfather   . Heart disease Paternal Grandfather   . Hypercholesterolemia Paternal Grandfather   . Stroke Paternal Grandfather   . Colon cancer Paternal Grandfather   . Diabetes Paternal  Grandfather   . Hypertension Sister   . Hypercholesterolemia Sister   . Heart disease Maternal Grandfather   . Diabetes Maternal Grandfather   . Lung cancer Maternal Grandmother   . Diabetes Maternal Grandmother   . Diabetes Paternal Grandmother   . Arthritis Unknown   . Brain cancer Mother    Family Psychiatric  History: See admission H&P Social History:  Social History   Substance and Sexual Activity  Alcohol Use Yes   Comment: daily 12-24 beers     Social History   Substance and Sexual Activity  Drug Use No    Social History   Socioeconomic History  . Marital status: Single    Spouse name: Not on file  . Number of children: Not on file  . Years of education: Not on file  . Highest education level: Not on file  Occupational History  . Not on file  Social Needs  . Financial resource strain: Not on file  . Food insecurity:    Worry: Not on file    Inability: Not on file  . Transportation needs:    Medical: Not on file    Non-medical: Not on file  Tobacco Use  . Smoking status: Former Games developermoker  . Smokeless tobacco: Current User    Types: Chew  Substance and Sexual Activity  . Alcohol use: Yes    Comment: daily 12-24 beers  . Drug use: No  . Sexual activity: Not on file  Lifestyle  . Physical activity:    Days per week: Not on file    Minutes per session: Not on file  . Stress: Not on file  Relationships  . Social connections:    Talks on phone: Not on file    Gets together: Not on file    Attends religious service: Not on file    Active member of club or organization: Not on file    Attends meetings of clubs or organizations: Not on file    Relationship status: Not on file  Other Topics Concern  . Not on file  Social History Narrative  . Not on file   Additional Social History:      Name of Substance 1: Alcohol 1 - Amount (size/oz): 12 to 24 beers 1 - Frequency: daily 1 - Duration: ongoing 1 - Last Use / Amount: 08/09/2018                   Sleep: Good  Appetite:  Good  Current Medications: Current Facility-Administered Medications  Medication Dose Route Frequency Provider Last Rate Last Dose  . alum & mag hydroxide-simeth (MAALOX/MYLANTA) 200-200-20 MG/5ML suspension 30 mL  30 mL Oral Q4H PRN Rosario AdieStarkes-Perry, Juel Burrowakia S, FNP      . aspirin EC tablet 81 mg  81 mg Oral Daily , Rockey SituFernando A, MD   81 mg at 08/12/18 0747  . atorvastatin (LIPITOR) tablet 40 mg  40 mg Oral q1800 , Rockey SituFernando A, MD   40 mg at 08/11/18 1827  . DULoxetine (CYMBALTA) DR capsule 20 mg  20 mg Oral Daily , Rockey SituFernando A, MD   20 mg  at 08/12/18 0747  . hydrOXYzine (ATARAX/VISTARIL) tablet 25 mg  25 mg Oral Q6H PRN Nira Conn A, NP   25 mg at 08/11/18 2134  . insulin aspart (novoLOG) injection 0-9 Units  0-9 Units Subcutaneous TID WC Jackelyn Poling, NP   2 Units at 08/12/18 [phone]  . loperamide (IMODIUM) capsule 2-4 mg  2-4 mg Oral PRN Nira Conn A, NP      . LORazepam (ATIVAN) tablet 1 mg  1 mg Oral Q6H PRN Nira Conn A, NP      . magnesium hydroxide (MILK OF MAGNESIA) suspension 30 mL  30 mL Oral Daily PRN Rosario Adie, Juel Burrow, FNP      . metoprolol tartrate (LOPRESSOR) tablet 25 mg  25 mg Oral BID , Rockey Situ, MD   25 mg at 08/12/18 [phone]  . multivitamin with minerals tablet 1 tablet  1 tablet Oral Daily Nira Conn A, NP   1 tablet at 08/12/18 0747  . nitroGLYCERIN (NITROSTAT) SL tablet 0.4 mg  0.4 mg Sublingual Q5 min PRN , Rockey Situ, MD      . omega-3 acid ethyl esters (LOVAZA) capsule 1 g  1 g Oral BID , Rockey Situ, MD   1 g at 08/12/18 0747  . ondansetron (ZOFRAN-ODT) disintegrating tablet 4 mg  4 mg Oral Q6H PRN Nira Conn A, NP      . primidone (MYSOLINE) tablet 50 mg  50 mg Oral QHS , Rockey Situ, MD   50 mg at 08/11/18 2134  . thiamine (VITAMIN B-1) tablet 100 mg  100 mg Oral Daily Nira Conn A, NP   100 mg at 08/12/18 0747  . traZODone (DESYREL) tablet 100 mg  100 mg Oral QHS PRN,MR X 1 Nira Conn A, NP    100 mg at 08/11/18 2134    Lab Results:  Results for orders placed or performed during the hospital encounter of 08/10/18 (from the past 48 hour(s))  Glucose, capillary     Status: Abnormal   Collection Time: 08/10/18  5:24 PM  Result Value Ref Range   Glucose-Capillary 295 (H) 70 - 99 mg/dL   Comment 1 Notify RN    Comment 2 Document in Chart   Glucose, capillary     Status: Abnormal   Collection Time: 08/10/18  9:07 PM  Result Value Ref Range   Glucose-Capillary 265 (H) 70 - 99 mg/dL   Comment 1 Notify RN    Comment 2 Document in Chart   Glucose, capillary     Status: Abnormal   Collection Time: 08/11/18  6:00 AM  Result Value Ref Range   Glucose-Capillary 243 (H) 70 - 99 mg/dL   Comment 1 Notify RN   Hemoglobin A1c     Status: Abnormal   Collection Time: 08/11/18  6:37 AM  Result Value Ref Range   Hgb A1c MFr Bld 6.7 (H) 4.8 - 5.6 %    Comment: (NOTE) Pre diabetes:          5.7%-6.4% Diabetes:              >6.4% Glycemic control for   <7.0% adults with diabetes    Mean Plasma Glucose 145.59 mg/dL    Comment: Performed at [facility], [address], [place], [place] [address]  Lipid panel     Status: Abnormal   Collection Time: 08/11/18  6:37 AM  Result Value Ref Range   Cholesterol 253 (H) 0 - 200 mg/dL   Triglycerides 119 (H) <150  mg/dL   HDL 38 (L) >65>40 mg/dL   Total CHOL/HDL Ratio 6.7 RATIO   VLDL UNABLE TO CALCULATE IF TRIGLYCERIDE OVER 400 mg/dL 0 - 40 mg/dL   LDL Cholesterol UNABLE TO CALCULATE IF TRIGLYCERIDE OVER 400 mg/dL 0 - 99 mg/dL    Comment:        Total Cholesterol/HDL:CHD Risk Coronary Heart Disease Risk Table                     Men   Women  1/2 Average Risk   3.4   3.3  Average Risk       5.0   4.4  2 X Average Risk   9.6   7.1  3 X Average Risk  23.4   11.0        Use the calculated Patient Ratio above and the CHD Risk Table to determine the patient's CHD Risk.        ATP III CLASSIFICATION (LDL):  <100     mg/dL   Optimal   [phone]  mg/dL   Near or Above                    Optimal  130-159  mg/dL   Borderline  [id number]  mg/dL   High  >132>190     mg/dL   Very High Performed at [facility], [address] [place], [place] [address]   TSH     Status: Abnormal   Collection Time: 08/11/18  6:37 AM  Result Value Ref Range   TSH 6.466 (H) 0.350 - 4.500 uIU/mL    Comment: Performed by a 3rd Generation assay with a functional sensitivity of <=0.01 uIU/mL. Performed at [facility], [address] [place], [place] [address]   Glucose, capillary     Status: Abnormal   Collection Time: 08/11/18 11:36 AM  Result Value Ref Range   Glucose-Capillary 205 (H) 70 - 99 mg/dL   Comment 1 Notify RN   Glucose, capillary     Status: Abnormal   Collection Time: 08/11/18  5:04 PM  Result Value Ref Range   Glucose-Capillary 324 (H) 70 - 99 mg/dL  Comprehensive metabolic panel     Status: Abnormal   Collection Time: 08/11/18  6:45 PM  Result Value Ref Range   Sodium 135 135 - 145 mmol/L   Potassium 4.0 3.5 - 5.1 mmol/L   Chloride 103 98 - 111 mmol/L   CO2 22 22 - 32 mmol/L   Glucose, Bld 335 (H) 70 - 99 mg/dL   BUN 16 6 - 20 mg/dL   Creatinine, Ser 6.640.97 0.61 - 1.24 mg/dL   Calcium 9.2 8.9 - 40.310.3 mg/dL   Total Protein 6.3 (L) 6.5 - 8.1 g/dL   Albumin 3.9 3.5 - 5.0 g/dL   AST 72 (H) 15 - 41 U/L   ALT 89 (H) 0 - 44 U/L   Alkaline Phosphatase 106 38 - 126 U/L   Total Bilirubin 0.5 0.3 - 1.2 mg/dL   GFR calc non Af Amer >60 >60 mL/min   GFR calc Af Amer >60 >60 mL/min   Anion gap 10 5 - 15    Comment: Performed at [facility], [address] [place], [place] [address]  Glucose, capillary     Status: Abnormal   Collection Time: 08/11/18  9:02 PM  Result Value Ref Range   Glucose-Capillary 195 (H) 70 - 99 mg/dL   Comment 1 Notify  RN    Comment 2 Document in Chart   Glucose, capillary     Status: Abnormal   Collection Time: 08/12/18  5:43 AM  Result Value Ref  Range   Glucose-Capillary 199 (H) 70 - 99 mg/dL   Comment 1 Notify RN    Comment 2 Document in Chart   CBC with Differential/Platelet     Status: Abnormal   Collection Time: 08/12/18  6:39 AM  Result Value Ref Range   WBC 11.8 (H) 4.0 - 10.5 K/uL   RBC 5.08 4.22 - 5.81 MIL/uL   Hemoglobin 16.3 13.0 - 17.0 g/dL   HCT 29.5 62.1 - 30.8 %   MCV 96.9 80.0 - 100.0 fL   MCH 32.1 26.0 - 34.0 pg   MCHC 33.1 30.0 - 36.0 g/dL   RDW 65.7 84.6 - 96.2 %   Platelets 269 150 - 400 K/uL   nRBC 0.0 0.0 - 0.2 %   Neutrophils Relative % 57 %   Neutro Abs 6.7 1.7 - 7.7 K/uL   Lymphocytes Relative 26 %   Lymphs Abs 3.0 0.7 - 4.0 K/uL   Monocytes Relative 10 %   Monocytes Absolute 1.2 (H) 0.1 - 1.0 K/uL   Eosinophils Relative 5 %   Eosinophils Absolute 0.6 (H) 0.0 - 0.5 K/uL   Basophils Relative 1 %   Basophils Absolute 0.1 0.0 - 0.1 K/uL   Immature Granulocytes 1 %   Abs Immature Granulocytes 0.09 (H) 0.00 - 0.07 K/uL    Comment: Performed at [facility], [address], [place], [place] [address]  TSH     Status: Abnormal   Collection Time: 08/12/18  6:39 AM  Result Value Ref Range   TSH 4.974 (H) 0.350 - 4.500 uIU/mL    Comment: Performed by a 3rd Generation assay with a functional sensitivity of <=0.01 uIU/mL. Performed at [facility], [address], [place], [place] [address]     Blood Alcohol level:  Lab Results  Component Value Date   ETH 260 (H) 04/22/2015   ETH 156 (H) 02/05/2015    Metabolic Disorder Labs: Lab Results  Component Value Date   HGBA1C 6.7 (H) 08/11/2018   MPG 145.59 08/11/2018   No results found for: PROLACTIN Lab Results  Component Value Date   CHOL 253 (H) 08/11/2018   TRIG 470 (H) 08/11/2018   HDL 38 (L) 08/11/2018   CHOLHDL 6.7 08/11/2018   VLDL UNABLE TO CALCULATE IF TRIGLYCERIDE OVER 400 mg/dL 08/16/7251   LDLCALC UNABLE TO CALCULATE IF TRIGLYCERIDE OVER 400 mg/dL 66/44/0347   LDLCALC 425 (H) 07/19/2014     Physical Findings: AIMS: Facial and Oral Movements Muscles of Facial Expression: None, normal Lips and Perioral Area: None, normal Jaw: None, normal Tongue: None, normal,Extremity Movements Upper (arms, wrists, hands, fingers): None, normal Lower (legs, knees, ankles, toes): None, normal, Trunk Movements Neck, shoulders, hips: None, normal, Overall Severity Severity of abnormal movements (highest score from questions above): None, normal Incapacitation due to abnormal movements: None, normal Patient's awareness of abnormal movements (rate only patient's report): No Awareness, Dental Status Current problems with teeth and/or dentures?: No Does patient usually wear dentures?: No  CIWA:  CIWA-Ar Total: 0 COWS:  COWS Total Score: 1  Musculoskeletal: Strength & Muscle Tone: within normal limits Gait & Station: normal Patient leans: N/A  Psychiatric Specialty Exam: Physical Exam  Nursing note and vitals reviewed. Constitutional: He is oriented to person, place, and time. He appears well-developed and well-nourished.  Cardiovascular: Normal rate.  Respiratory: Effort normal.  Neurological: He is alert and oriented to person, place, and time.    Review of Systems  Constitutional: Negative.   Respiratory: Negative.   Cardiovascular: Negative.   Gastrointestinal: Negative.   Neurological: Negative.   Psychiatric/Behavioral: Positive for substance abuse (ETOH). Negative for depression, hallucinations, memory loss and suicidal ideas. The patient is not nervous/anxious and does not have insomnia.     Blood pressure (!) 141/99, pulse 90, temperature 98 F (36.7 C), temperature source Oral, resp. rate 18, height 5\' 8"  (1.727 m), weight 95.3 kg, SpO2 100 %.Body mass index is 31.93 kg/m.  General Appearance: Casual  Eye Contact:  Good  Speech:  Clear and Coherent  Volume:  Normal  Mood:  Euthymic  Affect:  Congruent  Thought Process:  Coherent  Orientation:  Full (Time, Place,  and Person)  Thought Content:  WDL  Suicidal Thoughts:  No  Homicidal Thoughts:  No  Memory:  Immediate;   Good  Judgement:  Good  Insight:  Fair  Psychomotor Activity:  Normal  Concentration:  Concentration: Good and Attention Span: Good  Recall:  Good  Fund of Knowledge:  Good  Language:  Good  Akathisia:  No  Handed:  Right  AIMS (if indicated):     Assets:  Communication Skills Desire for Improvement Financial Resources/Insurance Housing Social Support  ADL's:  Intact  Cognition:  WNL  Sleep:  Number of Hours: 6.25     Treatment Plan Summary: Daily contact with patient to assess and evaluate symptoms and progress in treatment and Medication management   Continue inpatient hospitalization.  MDD: Continue Cymbalta 20 mg PO daily  Anxiety: Continue Vistaril 25 mg PO Q6HR PRN anxiety  ETOH detox: Continue CIWA protocol with Ativan 1 mg PO PRN CIWA>10  Sleep: Continue trazodone 50 mg PO QHS PRN insomnia  Diabetes: Per diabetes coordinator (see note), increase Novolog to 0-15 units tidwc and add HS  Other medical: Continue ASA 81 mg PO daily Continue Lipitor 40 mg PO daily Continue Lopressor 25 mg PO BID Continue Lovaza 1 g PO BID Continue primidone 50 mg PO QHS  Patient will participate in the therapeutic group milieu.  Discharge disposition in progress.   Aldean Baker, NP 08/12/2018, 8:31 AM    ..Agree with NP Progress Note

## 2018-08-13 LAB — GLUCOSE, CAPILLARY: Glucose-Capillary: 322 mg/dL — ABNORMAL HIGH (ref 70–99)

## 2018-08-13 LAB — T3, FREE: T3, Free: 3.3 pg/mL (ref 2.0–4.4)

## 2018-08-13 MED ORDER — TRAZODONE HCL 100 MG PO TABS: 100.0000 mg | ORAL_TABLET | Freq: Every evening | ORAL | 0 refills | Status: DC | PRN

## 2018-08-13 MED ORDER — OMEGA-3-ACID ETHYL ESTERS 1 G PO CAPS: 1.0000 g | ORAL_CAPSULE | Freq: Two times a day (BID) | ORAL | 0 refills | Status: DC

## 2018-08-13 MED ORDER — METFORMIN HCL 500 MG PO TABS: 500.0000 mg | ORAL_TABLET | Freq: Two times a day (BID) | ORAL | 0 refills | Status: DC

## 2018-08-13 MED ORDER — METFORMIN HCL 500 MG PO TABS
500.0000 mg | ORAL_TABLET | Freq: Two times a day (BID) | ORAL | Status: DC
  Administered 2018-08-13: 500 mg via ORAL
  Filled 2018-08-13: qty 1
  Filled 2018-08-13: qty 14
  Filled 2018-08-13: qty 1
  Filled 2018-08-13: qty 14
  Filled 2018-08-13: qty 1

## 2018-08-13 MED ORDER — DULOXETINE HCL 20 MG PO CPEP: 20.0000 mg | ORAL_CAPSULE | Freq: Every day | ORAL | 0 refills | Status: DC

## 2018-08-13 MED ORDER — PRIMIDONE 50 MG PO TABS: 50.0000 mg | ORAL_TABLET | Freq: Every day | ORAL | 0 refills | Status: DC

## 2018-08-13 MED ORDER — ADULT MULTIVITAMIN W/MINERALS CH: 1.0000 | ORAL_TABLET | Freq: Every day | ORAL | 0 refills | Status: DC

## 2018-08-13 MED ORDER — NICOTINE POLACRILEX 2 MG MT GUM: 2.0000 mg | CHEWING_GUM | OROMUCOSAL | 0 refills | Status: DC | PRN

## 2018-08-13 MED ORDER — HYDROXYZINE HCL 25 MG PO TABS: 25.0000 mg | ORAL_TABLET | Freq: Four times a day (QID) | ORAL | 0 refills | Status: DC | PRN

## 2018-08-13 NOTE — Progress Notes (Signed)
Pt was called because he left his sample meds. Pt stated that he was at [facility] getting his perscription filled and he did not want the samples.

## 2018-08-13 NOTE — [facility] Suicide Risk Assessment (Signed)
[facility]   Principal Problem: MDD (major depressive disorder), recurrent episode, severe (HCC) Discharge Diagnoses: Principal Problem:   MDD (major depressive disorder), recurrent episode, severe (HCC) Active Problems:   ETOH abuse   Total Time spent with patient: 30 minutes  Musculoskeletal: Strength & Muscle Tone: within normal limits Gait & Station: normal Patient leans: N/A  Psychiatric Specialty Exam: Review of Systems  All other systems reviewed and are negative.   Blood pressure 129/90, pulse 97, temperature 98 F (36.7 C), temperature source Oral, resp. rate 20, height 5\' 8"  (1.727 m), weight 95.3 kg, SpO2 100 %.Body mass index is 31.93 kg/m.  General Appearance: Casual  Eye Contact::  Good  Speech:  Normal Rate409  Volume:  Normal  Mood:  Euthymic  Affect:  Congruent  Thought Process:  Coherent and Descriptions of Associations: Intact  Orientation:  Full (Time, Place, and Person)  Thought Content:  Logical  Suicidal Thoughts:  No  Homicidal Thoughts:  No  Memory:  Immediate;   Fair Recent;   Fair Remote;   Fair  Judgement:  Intact  Insight:  Fair  Psychomotor Activity:  Normal  Concentration:  Good  Recall:  Fair  Fund of Knowledge:Fair  Language: Good  Akathisia:  Negative  Handed:  Right  AIMS (if indicated):     Assets:  Desire for Improvement Housing Resilience Social Support  Sleep:  Number of Hours: 6.75  Cognition: WNL  ADL's:  Intact   Mental Status Per Nursing Assessment::   On Admission:  Suicidal ideation indicated by patient  Demographic Factors:  Male, Divorced or widowed and Caucasian  Loss Factors: Decline in physical health  Historical Factors: Impulsivity  Risk Reduction Factors:   Positive social support and Positive coping skills or problem solving skills  Continued Clinical Symptoms:  Depression:   Comorbid alcohol abuse/dependence Impulsivity Alcohol/Substance  Abuse/Dependencies  Cognitive Features That Contribute To Risk:  None    Suicide Risk:  Minimal: No identifiable suicidal ideation.  Patients presenting with no risk factors but with morbid ruminations; may be classified as minimal risk based on the severity of the depressive symptoms  Follow-up Information    [facility] Follow up.   Why:  They cannot schedule with you until you change your primary care provider on your medicaid card from [facility].  Contact information: [address] [place] [address] Tel:  [phone] Fax:  [phone]       [facility] Follow up on 08/20/2018.   Why:  Hospital follow-up on Tuesday, 1/7 at 12:00PM. You must bring: photo ID, medicaid card, and hospital discharge paperwork to this appt.  Contact information: [address] [place] [address] [phone]           Plan Of Care/Follow-up recommendations:  Activity:  ad lib  Antonieta PertGreg Lawson Clary, MD 08/13/2018, 8:13 AM

## 2018-08-13 NOTE — Progress Notes (Addendum)
  [facility] Discharge Plan :  Will you be returning to the same living situation after discharge:  Yes,  home At discharge, do you have transportation home?: Yes, family member coming to pick him up this morning.  Do you have the ability to pay for your medications: Yes,  medicaid  Release of information consent forms completed and submitted to medical records by CSW.   Patient to Follow up at: Follow-up Information    [facility] Follow up.   Why:  They cannot schedule with you until you change your primary care provider on your medicaid card from [facility].  Contact information: [address] [place] [address] Tel:  [phone] Fax:  [phone]       [facility] Follow up on 08/20/2018.   Why:  Hospital follow-up on Tuesday, 1/7 at 12:00PM. You must bring: photo ID, medicaid card, and hospital discharge paperwork to this appt.  Contact information: [address] [place] [address] [phone]           Next level of care provider has access to [facility] Link:no  Safety Planning and Suicide Prevention discussed: Yes,  SPE completed with pt's wife and with pt. SPI pamphlet and mobile crisis information provided to pt.    Has patient been referred to the Quitline?: Patient refused referral  Patient has been referred for addiction treatment: Yes  Rona RavensHeather S Merrianne Mccumbers, LCSW 08/13/2018, 8:36 AM

## 2018-08-13 NOTE — Progress Notes (Signed)
Patient ID: Samuel Williams, male   DOB: 03-03-1971, 47 y.o.   MRN: [id number] Pt d/c to home with wife. D/c instructions, prescriptions, transition record, suicide prevention information, SRA, and lab work reviewed and given to pt. Pt verbalizes understanding. Pt denies s.i.

## 2018-08-13 NOTE — Discharge Summary (Signed)
Physician Discharge Summary Note  Patient:  Samuel Williams is an 47 y.o., male MRN:  [id number] DOB:  1971-04-22 Patient phone:  [phone] (home)  Patient address:   [address] [place] [address],  Total Time spent with patient: 15 minutes  Date of Admission:  08/10/2018 Date of Discharge: 08/13/2018  Reason for Admission:  Depression with suicidal ideation, ETOH abuse  Principal Problem: MDD (major depressive disorder), recurrent episode, severe (HCC) Discharge Diagnoses: Principal Problem:   MDD (major depressive disorder), recurrent episode, severe (HCC) Active Problems:   ETOH abuse   Past Psychiatric History: From admission H&P: Long history of alcohol abuse. 3-4 hospitalization as a child for depression. One prior suicide attempt as a teenager via trying to shoot a gun in his mouth (did not fire).  Past Medical History:  Past Medical History:  Diagnosis Date  . Backache, unspecified   . Bipolar 1 disorder (HCC)   . Diabetes mellitus without complication (HCC)   . Hyperlipidemia   . Major depression   . MI (myocardial infarction) (HCC) x5   per report, normal catheterization 2015  . Obstructive sleep apnea (adult) (pediatric)   . Other and unspecified hyperlipidemia   . Pain in limb   . Parkinson disease (HCC)   . PTSD (post-traumatic stress disorder)   . Restless legs syndrome (RLS)   . Unspecified asthma(493.90)   . Unspecified essential hypertension   . 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 Unspecified vitamin D deficiency     Past Surgical History:  Procedure Laterality Date  . CARDIAC CATHETERIZATION  2015   non obstructive CAD, normal LV function  . CHOLECYSTECTOMY    . CHOLECYSTECTOMY    . KNEE SURGERY    . LEFT HEART CATHETERIZATION WITH CORONARY ANGIOGRAM N/A 10/28/2013   Procedure: LEFT HEART CATHETERIZATION WITH CORONARY ANGIOGRAM;  Surgeon: Peter M Swaziland, MD;  [facility];  Service:  Cardiovascular;  Laterality: N/A;  . LOOP RECORDER IMPLANT N/A 07/20/2014   Procedure: LOOP RECORDER IMPLANT;  Surgeon: Marinus Maw, MD;  Location: [facility] CATH LAB;  Service: Cardiovascular;  Laterality: N/A;  . TONSILLECTOMY     Family History:  Family History  Problem Relation Age of Onset  . Hypertension Father   . Heart disease Father   . Hypercholesterolemia Father   . Thyroid disease Father   . Stroke Father   . Cancer Father   . Hypertension Paternal Grandfather   . Heart disease Paternal Grandfather   . Hypercholesterolemia Paternal Grandfather   . Stroke Paternal Grandfather   . Colon cancer Paternal Grandfather   . Diabetes Paternal Grandfather   . Hypertension Sister   . Hypercholesterolemia Sister   . Heart disease Maternal Grandfather   . Diabetes Maternal Grandfather   . Lung cancer Maternal Grandmother   . Diabetes Maternal Grandmother   . Diabetes Paternal Grandmother   . Arthritis Unknown   . Brain cancer Mother    Family Psychiatric  History: none Social History:  Social History   Substance and Sexual Activity  Alcohol Use Yes   Comment: daily 12-24 beers     Social History   Substance and Sexual Activity  Drug Use No    Social History   Socioeconomic History  . Marital status: Single    Spouse name: Not on file  . Number of children: Not on file  . Years of education: Not on file  . Highest education level: Not on file  Occupational  History  . Not on file  Social Needs  . Financial resource strain: Not on file  . Food insecurity:    Worry: Not on file    Inability: Not on file  . Transportation needs:    Medical: Not on file    Non-medical: Not on file  Tobacco Use  . Smoking status: Former Games developermoker  . Smokeless tobacco: Current User    Types: Chew  Substance and Sexual Activity  . Alcohol use: Yes    Comment: daily 12-24 beers  . Drug use: No  . Sexual activity: Not on file  Lifestyle  . Physical activity:    Days per week: Not  on file    Minutes per session: Not on file  . Stress: Not on file  Relationships  . Social connections:    Talks on phone: Not on file    Gets together: Not on file    Attends religious service: Not on file    Active member of club or organization: Not on file    Attends meetings of clubs or organizations: Not on file    Relationship status: Not on file  Other Topics Concern  . Not on file  Social History Narrative  . Not on file    Hospital Course:  From admission H&P 08/11/2018: Mr. Samuel Williams is a 47 year old male with past history of alcohol use disorder, PTSD, MDD, HTN, HLD, CAD, MI x 6, essential tremors, and asthma who presents for treatment of suicidal ideation with alcohol abuse. He has a long history of depression since childhood that has worsened over the last two months. [facility] with a friend for suicidal ideation. He reports insomnia, with 2-3 hours of sleep per night. Also reports fatigue and reduced appetite (one meal per day at most). Reports history of alcohol abuse for years. Recently he reports almost daily consumption of 12-24 beers. He states he will sometimes go several days without drinking, and denies any history of DTs, seizures or severe withdrawal symptoms. Denies any other drug use. UDS is negative. Today he denies SI, HI, AVH.  Samuel Williams was admitted for depression with suicidal ideation. [facility]. The patient stabilized on medication and therapy. Patient was discharged on Cymbalta, Vistaril and trazodone. Patient has shown improvement with improved mood, affect, sleep, appetite, and interaction. Patient denies any SI/HI/AVH and contracts for safety. Patient agrees to follow up at the outpatient resources listed below. The patient was also newly diagnosed with type 2 diabetes during this hospital admission. He agreed to follow up with PCP for management of diabetes. Patient is provided with  prescriptions for their medications upon discharge.  Physical Findings: AIMS: Facial and Oral Movements Muscles of Facial Expression: None, normal Lips and Perioral Area: None, normal Jaw: None, normal Tongue: None, normal,Extremity Movements Upper (arms, wrists, hands, fingers): None, normal Lower (legs, knees, ankles, toes): None, normal, Trunk Movements Neck, shoulders, hips: None, normal, Overall Severity Severity of abnormal movements (highest score from questions above): None, normal Incapacitation due to abnormal movements: None, normal Patient's awareness of abnormal movements (rate only patient's report): No Awareness, Dental Status Current problems with teeth and/or dentures?: No Does patient usually wear dentures?: No  CIWA:  CIWA-Ar Total: 0 COWS:  COWS Total Score: 0  Musculoskeletal: Strength & Muscle Tone: within normal limits Gait & Station: normal Patient leans: N/A  Psychiatric Specialty Exam: Physical Exam  Nursing note and vitals reviewed. Constitutional:  He is oriented to person, place, and time. He appears well-developed.  HENT:  Head: Normocephalic.  Cardiovascular: Normal rate.  Respiratory: Effort normal.  Neurological: He is alert and oriented to person, place, and time.    Review of Systems  Psychiatric/Behavioral: Positive for depression (stable on medication) and substance abuse (hx ETOH). Negative for hallucinations, memory loss and suicidal ideas. The patient is not nervous/anxious and does not have insomnia.     Blood pressure 129/90, pulse 97, temperature 98 F (36.7 C), temperature source Oral, resp. rate 20, height 5\' 8"  (1.727 m), weight 95.3 kg, SpO2 100 %.Body mass index is 31.93 kg/m.  See MD's discharge SRA        Has this patient used any form of tobacco in the last 30 days? (Cigarettes, Smokeless Tobacco, Cigars, and/or Pipes) Yes, an FDA-approved tobacco cessation medication was offered at discharge.   Blood Alcohol level:   Lab Results  Component Value Date   ETH 260 (H) 04/22/2015   ETH 156 (H) 02/05/2015    Metabolic Disorder Labs:  Lab Results  Component Value Date   HGBA1C 6.7 (H) 08/11/2018   MPG 145.59 08/11/2018   No results found for: PROLACTIN Lab Results  Component Value Date   CHOL 253 (H) 08/11/2018   TRIG 470 (H) 08/11/2018   HDL 38 (L) 08/11/2018   CHOLHDL 6.7 08/11/2018   VLDL UNABLE TO CALCULATE IF TRIGLYCERIDE OVER 400 mg/dL 16/10/960412/29/2019   LDLCALC UNABLE TO CALCULATE IF TRIGLYCERIDE OVER 400 mg/dL 54/09/811912/29/2019   LDLCALC 147117 (H) 07/19/2014    See Psychiatric Specialty Exam and Suicide Risk Assessment completed by Attending Physician prior to discharge.  Discharge destination:  Home  Is patient on multiple antipsychotic therapies at discharge:  No   Has Patient had three or more failed trials of antipsychotic monotherapy by history:  No  Recommended Plan for Multiple Antipsychotic Therapies: NA  Discharge Instructions    Discharge instructions   Complete by:  As directed    Patient is instructed to take all prescribed medications as recommended. Report any side effects or adverse reactions to your outpatient psychiatrist. Patient is instructed to abstain from alcohol and illegal drugs while on prescription medications. In the event of worsening symptoms, patient is instructed to call the crisis hotline, 911, or go to the nearest emergency department for evaluation and treatment.     Allergies as of 08/13/2018      Reactions   Bee Venom Anaphylaxis   Mushroom Extract Complex Anaphylaxis   RAW ONLY   Ambien [zolpidem]    anger   Protonix [pantoprazole Sodium]       Medication List    STOP taking these medications   meloxicam 7.5 MG tablet Commonly known as:  MOBIC     TAKE these medications     Indication  aspirin EC 81 MG tablet Take 1 tablet by mouth daily.  Indication:  Antiplatelet   atorvastatin 40 MG tablet Commonly known as:  LIPITOR Take 40 mg by  mouth daily.  Indication:  High Amount of Fats in the Blood   DULoxetine 20 MG capsule Commonly known as:  CYMBALTA Take 1 capsule (20 mg total) by mouth daily. For mood Start taking on:  August 14, 2018  Indication:  Major Depressive Disorder   EPINEPHrine 0.15 MG/0.15ML injection Commonly known as:  EPIPEN JR Inject 0.15 mg into the muscle as needed for anaphylaxis (bees, mushrooms).  Indication:  Life-Threatening Hypersensitivity Reaction   hydrOXYzine 25 MG tablet Commonly known  as:  ATARAX/VISTARIL Take 1 tablet (25 mg total) by mouth every 6 (six) hours as needed (anxiety/agitation or CIWA < or = 10).  Indication:  Feeling Anxious   metFORMIN 500 MG tablet Commonly known as:  GLUCOPHAGE Take 1 tablet (500 mg total) by mouth 2 (two) times daily with a meal. For high blood sugars  Indication:  Type 2 Diabetes   metoprolol tartrate 25 MG tablet Commonly known as:  LOPRESSOR Take 1 tablet by mouth 2 (two) times daily.  Indication:  High Blood Pressure Disorder   multivitamin with minerals Tabs tablet Take 1 tablet by mouth daily. (May buy over the counter) Start taking on:  August 14, 2018  Indication:  Supplementation   nicotine polacrilex 2 MG gum Commonly known as:  NICORETTE Take 1 each (2 mg total) by mouth as needed for smoking cessation. (May buy over the counter)  Indication:  Nicotine Addiction   nitroGLYCERIN 0.4 MG SL tablet Commonly known as:  NITROSTAT Place 0.4 mg under the tongue every 5 (five) minutes as needed for chest pain.  Indication:  Chest pain   omega-3 acid ethyl esters 1 g capsule Commonly known as:  LOVAZA Take 1 capsule (1 g total) by mouth 2 (two) times daily. For high triglycerides  Indication:  High Amount of Triglycerides in the Blood   primidone 50 MG tablet Commonly known as:  MYSOLINE Take 1 tablet (50 mg total) by mouth at bedtime. For essential tremor What changed:    how much to take  when to take this  additional  instructions  Indication:  Fine to Coarse Slow Tremor affecting Head, Hands & Voice   traZODone 100 MG tablet Commonly known as:  DESYREL Take 1 tablet (100 mg total) by mouth at bedtime as needed and may repeat dose one time if needed for sleep.  Indication:  Trouble Sleeping      Follow-up Information    [facility] Follow up.   Why:  They cannot schedule with you until you change your primary care provider on your medicaid card from [facility].  Contact information: [address] [place] [address] Tel:  [phone] Fax:  [phone]       [facility] Follow up on 08/20/2018.   Why:  Hospital follow-up on Tuesday, 1/7 at 12:00PM. You must bring: photo ID, medicaid card, and hospital discharge paperwork to this appt.  Contact information: [address] [place] [address] [phone]           Follow-up recommendations: Activity as tolerated. Diet as recommended by primary care physician. Keep all scheduled follow-up appointments as recommended.   Comments:   Patient is instructed to take all prescribed medications as recommended. Report any side effects or adverse reactions to your outpatient psychiatrist. Patient is instructed to abstain from alcohol and illegal drugs while on prescription medications. In the event of worsening symptoms, patient is instructed to call the crisis hotline, 911, or go to the nearest emergency department for evaluation and treatment.  Signed: Aldean Baker, NP 08/13/2018, 9:12 AM

## 2018-12-16 ENCOUNTER — Emergency Department (HOSPITAL_COMMUNITY)
Admission: EM | Admit: 2018-12-16 | Discharge: 2018-12-16 | Discharge status: Absent without leave | Payer: Medicaid Other

## 2018-12-16 ENCOUNTER — Other Ambulatory Visit: Payer: Self-pay

## 2018-12-16 NOTE — ED Notes (Signed)
Pt stated he didn't want to wait so Pt and Family left

## 2019-03-27 DIAGNOSIS — R0600 Dyspnea, unspecified: Secondary | ICD-10-CM

## 2019-09-15 DIAGNOSIS — E871 Hypo-osmolality and hyponatremia: Secondary | ICD-10-CM

## 2019-09-15 DIAGNOSIS — E1165 Type 2 diabetes mellitus with hyperglycemia: Secondary | ICD-10-CM

## 2019-09-15 DIAGNOSIS — I249 Acute ischemic heart disease, unspecified: Secondary | ICD-10-CM | POA: Diagnosis not present

## 2019-09-15 DIAGNOSIS — R079 Chest pain, unspecified: Secondary | ICD-10-CM

## 2019-11-18 ENCOUNTER — Other Ambulatory Visit: Payer: Self-pay | Admitting: Orthopedic Surgery

## 2019-11-18 DIAGNOSIS — M545 Low back pain, unspecified: Secondary | ICD-10-CM

## 2019-12-04 ENCOUNTER — Ambulatory Visit
Admission: RE | Admit: 2019-12-04 | Discharge: 2019-12-04 | Disposition: A | Discharge status: Home / Self Care | Payer: Medicaid Other | Source: Ambulatory Visit | Attending: Orthopedic Surgery | Admitting: Orthopedic Surgery

## 2019-12-04 DIAGNOSIS — M545 Low back pain, unspecified: Secondary | ICD-10-CM

## 2019-12-04 IMAGING — CT CT L SPINE W/O CM
3 of 5 series · 11 of 33 positions shown, 13 images · non-contrast
Comparison: CT abdomen and pelvis [DATE]

CLINICAL DATA: Chronic low back pain. Right hip pain. History of
breast cancer.

EXAM:
CT LUMBAR SPINE WITHOUT CONTRAST
TECHNIQUE: Multidetector CT imaging of the lumbar spine was performed without
intravenous contrast administration. Multiplanar CT image
reconstructions were also generated.

[Series 3: l-spine 2.00 br60 s3 sag bone · sagittal · 0.26mm/px · 5 of 70 slices shown, 6 images]
[im 24/70  bone]
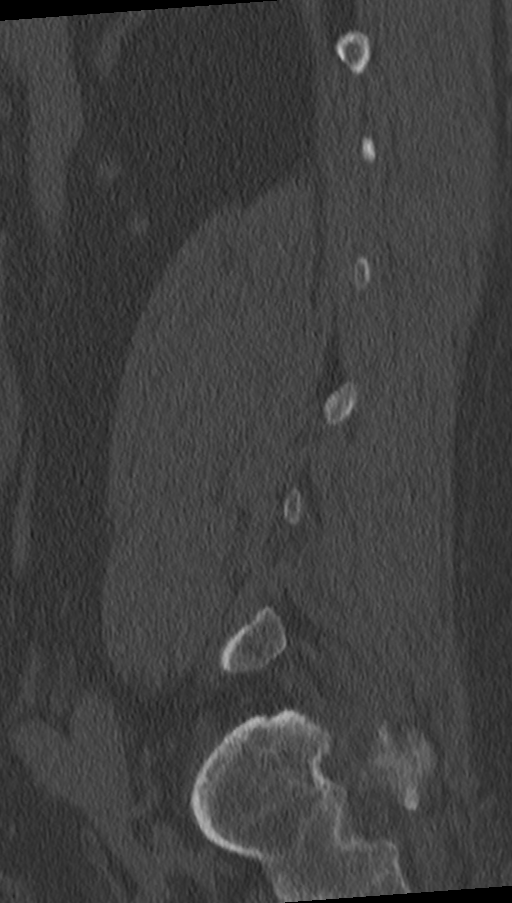
[im 29/70  bone]
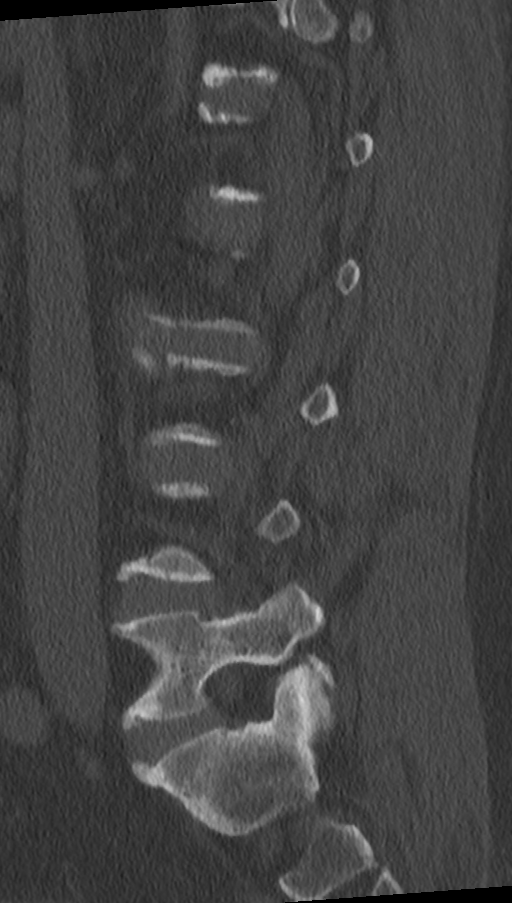
[im 35/70  soft-tissue]
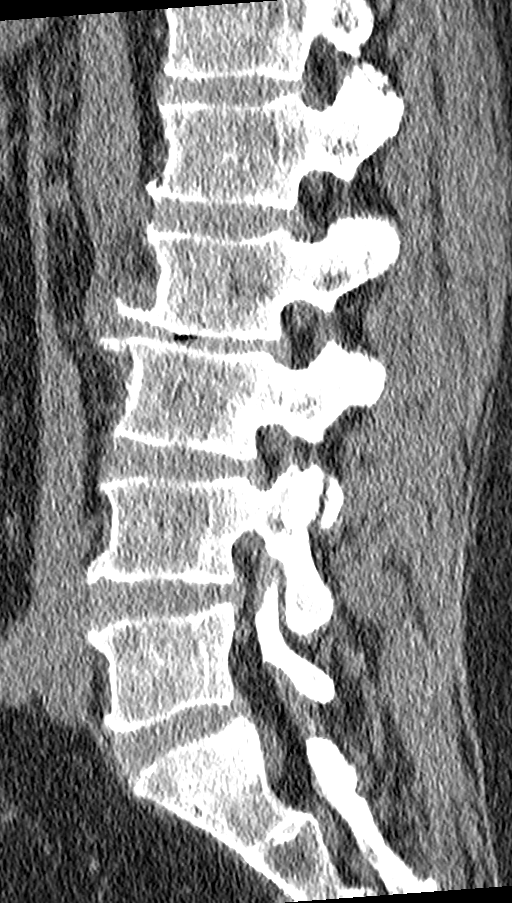
[im 35/70  bone]
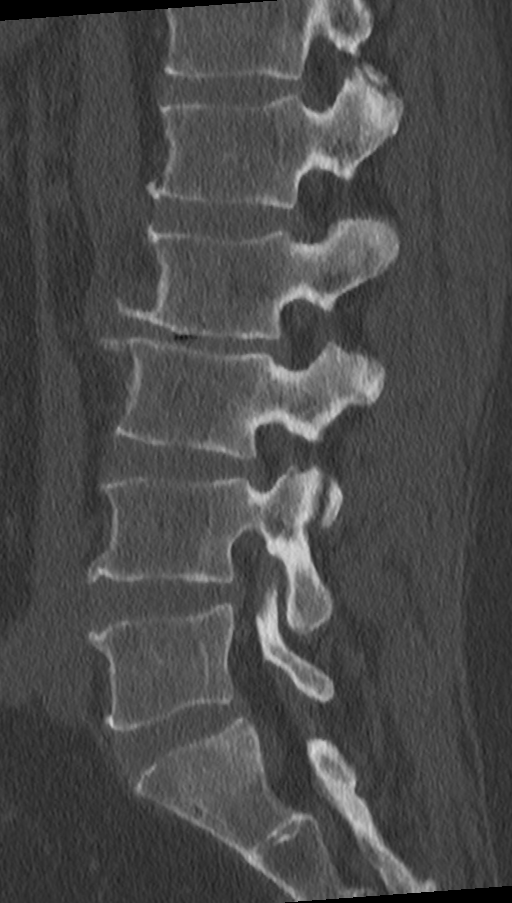
[im 41/70  bone]
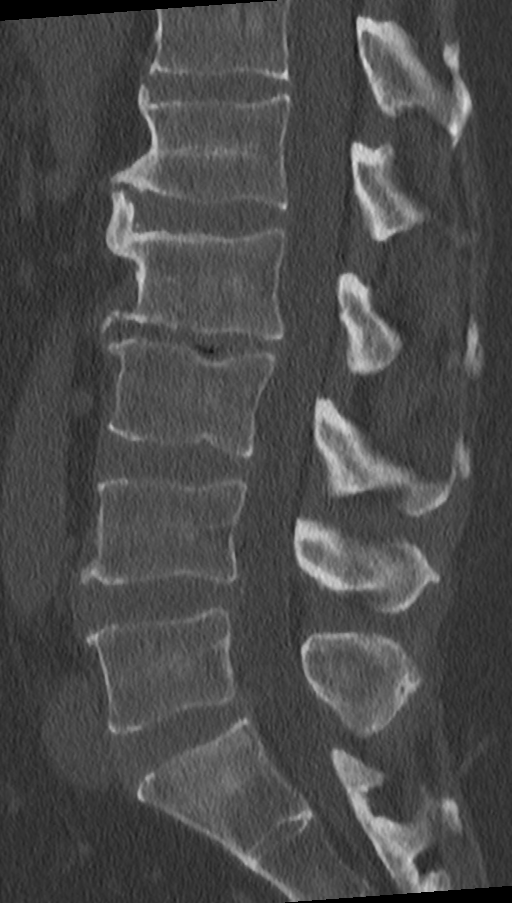
[im 47/70  bone]
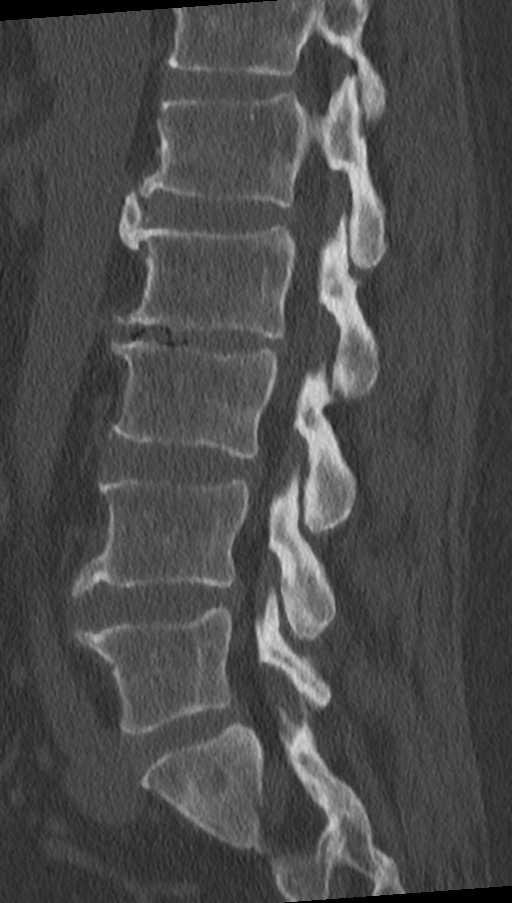

[Series 5: l-spine 2.00 br60 s3 cor bone · coronal · 0.27mm/px · 3 of 66 slices shown]
[im 14/66  bone]
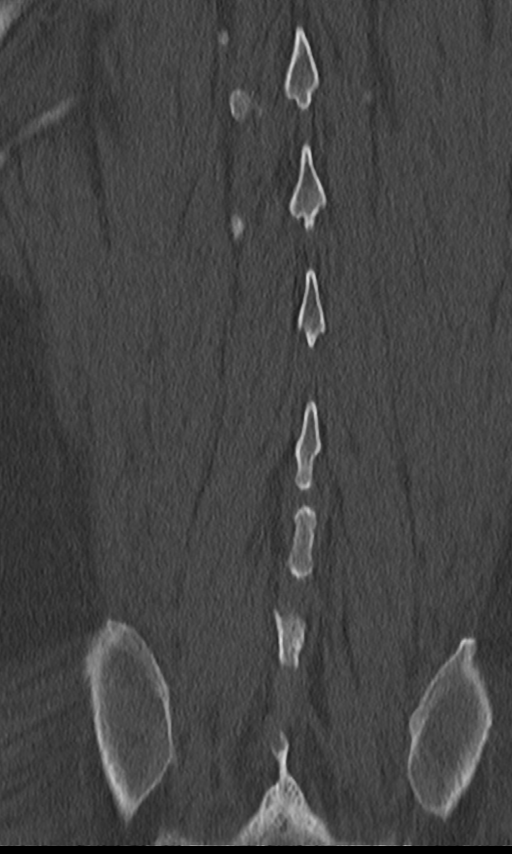
[im 27/66  bone]
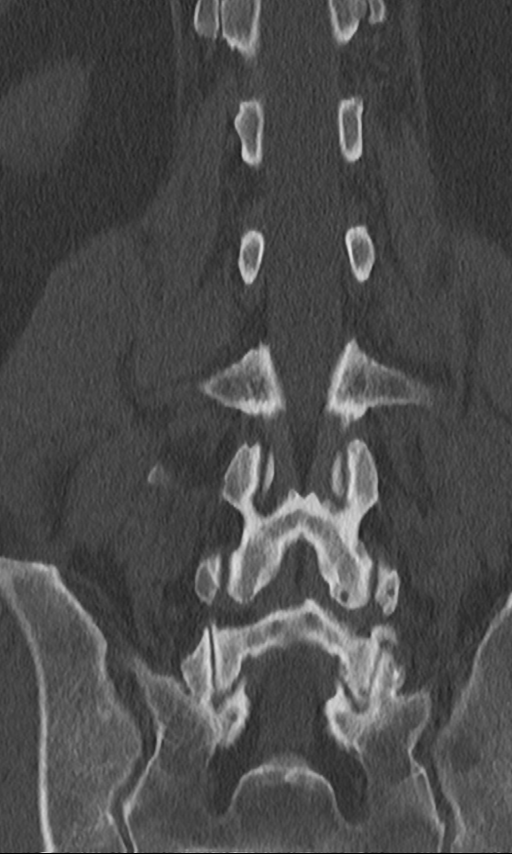
[im 40/66  bone]
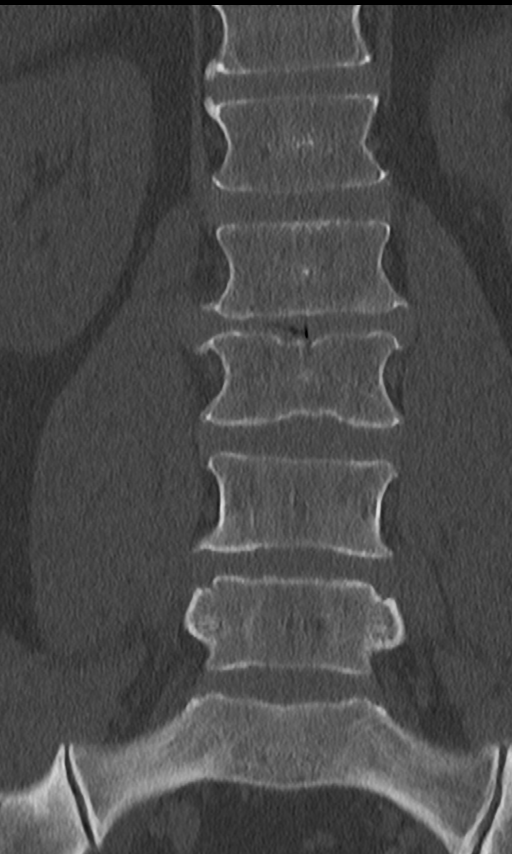

[Series 10: l-spine 2.00 br40 s3 lspine st · axial · 0.27mm/px · z∈[+1396,+1504]mm · 3 of 109 slices shown, 4 images]
[im 28/109  soft-tissue]
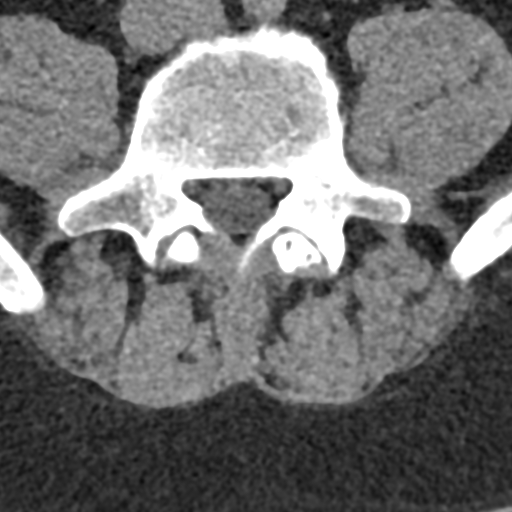
[im 28/109  bone]
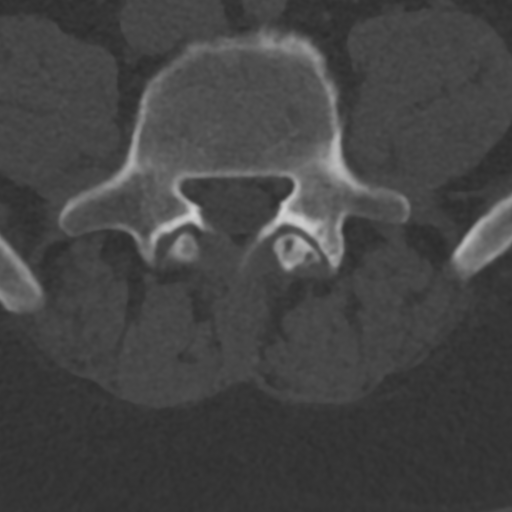
[im 55/109  bone]
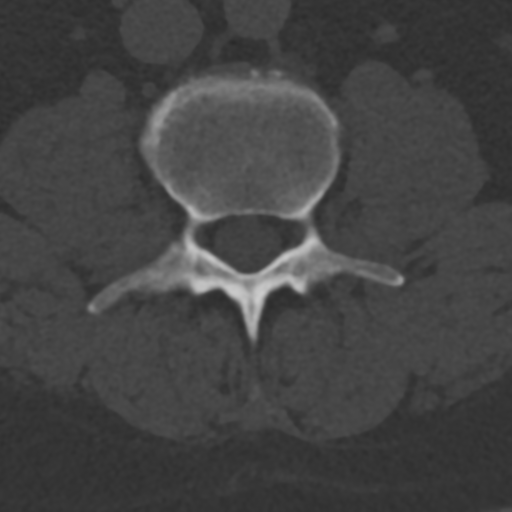
[im 82/109  bone]
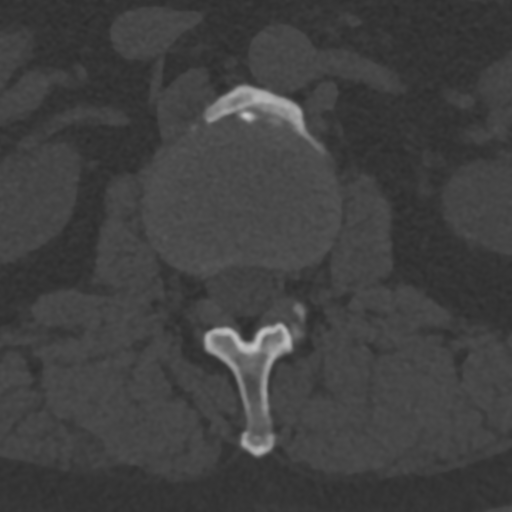

[11 of 33 positions shown; findings below may reference images not displayed]

FINDINGS: Segmentation: 5 lumbar type vertebrae.

Alignment: Unchanged trace retrolisthesis of L4 on L5.

Vertebrae: No fracture or destructive osseous process. Unchanged
subcentimeter sclerotic focus in the L1 vertebral body, possibly a
bone island. Unchanged multilevel anterior vertebral osteophytosis,
bulky at L1-2.

Paraspinal and other soft tissues: Hepatic steatosis.

Disc levels:

T12-L1: Mild spondylosis. No stenosis.

L1-2: Spondylosis with bulky anterior vertebral spurring. Minimal
disc bulging and mild facet and ligamentum flavum hypertrophy
without stenosis.

L2-3: Unchanged moderate disc space narrowing. Mild circumferential
disc bulging and moderate facet and ligamentum flavum hypertrophy
without evidence of significant stenosis.

L3-4: Mild circumferential disc bulging and mild facet and
ligamentum flavum hypertrophy without evidence of significant
stenosis.

L4-5: Mild circumferential disc bulging and moderate facet and
ligamentum flavum hypertrophy without evidence of significant
stenosis.

L5-S1: Mild disc bulging and mild right and moderate left facet
arthrosis without stenosis.
IMPRESSION: 1. Lumbar spondylosis and facet arthrosis without evidence of
significant stenosis.
2. Hepatic steatosis.

## 2020-08-02 ENCOUNTER — Encounter (HOSPITAL_COMMUNITY): Payer: Self-pay

## 2020-08-02 NOTE — Progress Notes (Signed)
DUE TO COVID-19 ONLY ONE VISITOR IS ALLOWED TO COME WITH YOU AND STAY IN THE WAITING ROOM ONLY DURING PRE OP AND PROCEDURE DAY OF SURGERY. THE 1 VISITOR  MAY VISIT WITH YOU AFTER SURGERY IN YOUR PRIVATE ROOM DURING VISITING HOURS ONLY!  YOU NEED TO HAVE A COVID 19 TEST ON___12/27/2021 ____ @_______ , THIS TEST MUST BE DONE BEFORE SURGERY,  COVID TESTING SITE [address] [place] , IT IS ON THE RIGHT GOING OUT [address] APPROXIMATELY  2 MINUTES PAST [employer] ON THE RIGHT. ONCE YOUR COVID TEST IS COMPLETED,  PLEASE BEGIN THE QUARANTINE INSTRUCTIONS AS OUTLINED IN YOUR HANDOUT.                Samuel Williams  08/02/2020   Your procedure is scheduled on: 08/10/2020    Report to [facility] Main  Entrance   Report to admitting at   0730 AM     Call this number if you have problems the morning of surgery [phone]    REMEMBER: NO  SOLID FOOD CANDY OR GUM AFTER MIDNIGHT. CLEAR LIQUIDS UNTIL 0700am         . NOTHING BY MOUTH EXCEPT CLEAR LIQUIDS UNTIL    . PLEASE FINISH ENSURE DRINK PER SURGEON ORDER  WHICH NEEDS TO BE COMPLETED AT   0700am    .      CLEAR LIQUID DIET   Foods Allowed                                                                    Coffee and tea, regular and decaf                            Fruit ices (not with fruit pulp)                                      Iced Popsicles                                    Carbonated beverages, regular and diet                                    Cranberry, grape and apple juices Sports drinks like Gatorade Lightly seasoned clear broth or consume(fat free) Sugar, honey syrup ___________________________________________________________________      BRUSH YOUR TEETH MORNING OF SURGERY AND RINSE YOUR MOUTH OUT, NO CHEWING GUM CANDY OR MINTS.     Take these medicines the morning of surgery with A SIP OF WATER: Amlodipine, cymbalta, Primidone   DO NOT TAKE ANY DIABETIC MEDICATIONS  DAY OF YOUR SURGERY                               You may not have any metal on your body including hair pins and              piercings  Do  not wear jewelry, make-up, lotions, powders or perfumes, deodorant             Do not wear nail polish on your fingernails.  Do not shave  48 hours prior to surgery.              Men may shave face and neck.   Do not bring valuables to the hospital. [place].  Contacts, dentures or bridgework may not be worn into surgery.  Leave suitcase in the car. After surgery it may be brought to your room.     Patients discharged the day of surgery will not be allowed to drive home. IF YOU ARE HAVING SURGERY AND GOING HOME THE SAME DAY, YOU MUST HAVE AN ADULT TO DRIVE YOU HOME AND BE WITH YOU FOR 24 HOURS. YOU MAY GO HOME BY TAXI OR UBER OR ORTHERWISE, BUT AN ADULT MUST ACCOMPANY YOU HOME AND STAY WITH YOU FOR 24 HOURS.  Name and phone number of your driver:  Special Instructions: N/A              Please read over the following fact sheets you were given: _____________________________________________________________________  [facility] - Preparing for Surgery Before surgery, you can play an important role.  Because skin is not sterile, your skin needs to be as free of germs as possible.  You can reduce the number of germs on your skin by washing with CHG (chlorahexidine gluconate) soap before surgery.  CHG is an antiseptic cleaner which kills germs and bonds with the skin to continue killing germs even after washing. Please DO NOT use if you have an allergy to CHG or antibacterial soaps.  If your skin becomes reddened/irritated stop using the CHG and inform your nurse when you arrive at Short Stay. Do not shave (including legs and underarms) for at least 48 hours prior to the first CHG shower.  You may shave your face/neck. Please follow these instructions carefully:  1.  Shower with CHG Soap the night before  surgery and the  morning of Surgery.  2.  If you choose to wash your hair, wash your hair first as usual with your  normal  shampoo.  3.  After you shampoo, rinse your hair and body thoroughly to remove the  shampoo.                           4.  Use CHG as you would any other liquid soap.  You can apply chg directly  to the skin and wash                       Gently with a scrungie or clean washcloth.  5.  Apply the CHG Soap to your body ONLY FROM THE NECK DOWN.   Do not use on face/ open                           Wound or open sores. Avoid contact with eyes, ears mouth and genitals (private parts).                       Wash face,  Genitals (private parts) with your normal soap.             6.  Wash thoroughly,  paying special attention to the area where your surgery  will be performed.  7.  Thoroughly rinse your body with warm water from the neck down.  8.  DO NOT shower/wash with your normal soap after using and rinsing off  the CHG Soap.                9.  Pat yourself dry with a clean towel.            10.  Wear clean pajamas.            11.  Place clean sheets on your bed the night of your first shower and do not  sleep with pets. Day of Surgery : Do not apply any lotions/deodorants the morning of surgery.  Please wear clean clothes to the hospital/surgery center.  FAILURE TO FOLLOW THESE INSTRUCTIONS MAY RESULT IN THE CANCELLATION OF YOUR SURGERY PATIENT SIGNATURE_________________________________  NURSE SIGNATURE__________________________________  ________________________________________________________________________

## 2020-08-03 ENCOUNTER — Inpatient Hospital Stay (HOSPITAL_COMMUNITY)
Admission: RE | Admit: 2020-08-03 | Discharge: 2020-08-03 | Disposition: A | Discharge status: Home / Self Care | Payer: Medicaid Other | Source: Ambulatory Visit

## 2020-08-10 ENCOUNTER — Encounter (HOSPITAL_COMMUNITY): Admission: RE | Payer: Self-pay | Source: Home / Self Care

## 2020-08-10 ENCOUNTER — Ambulatory Visit (HOSPITAL_COMMUNITY): Admission: RE | Admit: 2020-08-10 | Payer: Medicaid Other | Source: Home / Self Care | Admitting: Orthopedic Surgery

## 2020-08-10 SURGERY — ARTHROPLASTY, HIP, TOTAL, ANTERIOR APPROACH
Anesthesia: Spinal | Site: Hip | Laterality: Right

## 2021-08-15 ENCOUNTER — Emergency Department (HOSPITAL_COMMUNITY)
Admission: EM | Admit: 2021-08-15 | Discharge: 2021-08-15 | Discharge status: Against Medical Advice | Payer: Medicaid Other | Attending: Emergency Medicine | Admitting: Emergency Medicine

## 2021-08-15 ENCOUNTER — Emergency Department (HOSPITAL_COMMUNITY): Payer: Medicaid Other

## 2021-08-15 ENCOUNTER — Other Ambulatory Visit: Payer: Self-pay

## 2021-08-15 DIAGNOSIS — Z7982 Long term (current) use of aspirin: Secondary | ICD-10-CM | POA: Insufficient documentation

## 2021-08-15 DIAGNOSIS — R059 Cough, unspecified: Secondary | ICD-10-CM | POA: Insufficient documentation

## 2021-08-15 DIAGNOSIS — R0789 Other chest pain: Secondary | ICD-10-CM | POA: Insufficient documentation

## 2021-08-15 DIAGNOSIS — M549 Dorsalgia, unspecified: Secondary | ICD-10-CM | POA: Insufficient documentation

## 2021-08-15 DIAGNOSIS — R079 Chest pain, unspecified: Secondary | ICD-10-CM | POA: Diagnosis present

## 2021-08-15 LAB — LIPASE, BLOOD: Lipase: 32 U/L (ref 11–51)

## 2021-08-15 LAB — COMPREHENSIVE METABOLIC PANEL
ALT: 48 U/L — ABNORMAL HIGH (ref 0–44)
AST: 61 U/L — ABNORMAL HIGH (ref 15–41)
Albumin: 4 g/dL (ref 3.5–5.0)
Alkaline Phosphatase: 71 U/L (ref 38–126)
Anion gap: 10 (ref 5–15)
BUN: 10 mg/dL (ref 6–20)
CO2: 24 mmol/L (ref 22–32)
Calcium: 9.3 mg/dL (ref 8.9–10.3)
Chloride: 105 mmol/L (ref 98–111)
Creatinine, Ser: 0.86 mg/dL (ref 0.61–1.24)
GFR, Estimated: >60 mL/min (ref >60)
Glucose, Bld: 111 mg/dL — ABNORMAL HIGH (ref 70–99)
Potassium: 4 mmol/L (ref 3.5–5.1)
Sodium: 139 mmol/L (ref 135–145)
Total Bilirubin: 0.9 mg/dL (ref 0.3–1.2)
Total Protein: 6.9 g/dL (ref 6.5–8.1)

## 2021-08-15 LAB — D-DIMER, QUANTITATIVE: D-Dimer, Quant: <0.27 ug/mL-FEU (ref 0.00–0.50)

## 2021-08-15 LAB — CBC WITH DIFFERENTIAL/PLATELET
Abs Immature Granulocytes: 0.05 10*3/uL (ref 0.00–0.07)
Basophils Absolute: 0.1 10*3/uL (ref 0.0–0.1)
Basophils Relative: 1 %
Eosinophils Absolute: 0.2 10*3/uL (ref 0.0–0.5)
Eosinophils Relative: 1 %
HCT: 48.6 % (ref 39.0–52.0)
Hemoglobin: 16.5 g/dL (ref 13.0–17.0)
Immature Granulocytes: 0 %
Lymphocytes Relative: 35 %
Lymphs Abs: 4.4 10*3/uL — ABNORMAL HIGH (ref 0.7–4.0)
MCH: 32.4 pg (ref 26.0–34.0)
MCHC: 34 g/dL (ref 30.0–36.0)
MCV: 95.5 fL (ref 80.0–100.0)
Monocytes Absolute: 1 10*3/uL (ref 0.1–1.0)
Monocytes Relative: 8 %
Neutro Abs: 6.8 10*3/uL (ref 1.7–7.7)
Neutrophils Relative %: 55 %
Platelets: 317 10*3/uL (ref 150–400)
RBC: 5.09 MIL/uL (ref 4.22–5.81)
RDW: 12.6 % (ref 11.5–15.5)
WBC: 12.5 10*3/uL — ABNORMAL HIGH (ref 4.0–10.5)
nRBC: 0 % (ref 0.0–0.2)

## 2021-08-15 LAB — PROTIME-INR
INR: 1 (ref 0.8–1.2)
Prothrombin Time: 13 seconds (ref 11.4–15.2)

## 2021-08-15 LAB — MAGNESIUM: Magnesium: 2.3 mg/dL (ref 1.7–2.4)

## 2021-08-15 LAB — TROPONIN I (HIGH SENSITIVITY)
Troponin I (High Sensitivity): 4 ng/L (ref <18)
Troponin I (High Sensitivity): 5 ng/L (ref <18)

## 2021-08-15 IMAGING — DX DG CHEST 1V PORT
1 series · 1 of 1 positions shown · non-contrast
Comparison: [DATE]

CLINICAL DATA: Chest pain

EXAM:
PORTABLE CHEST 1 VIEW

[chest]
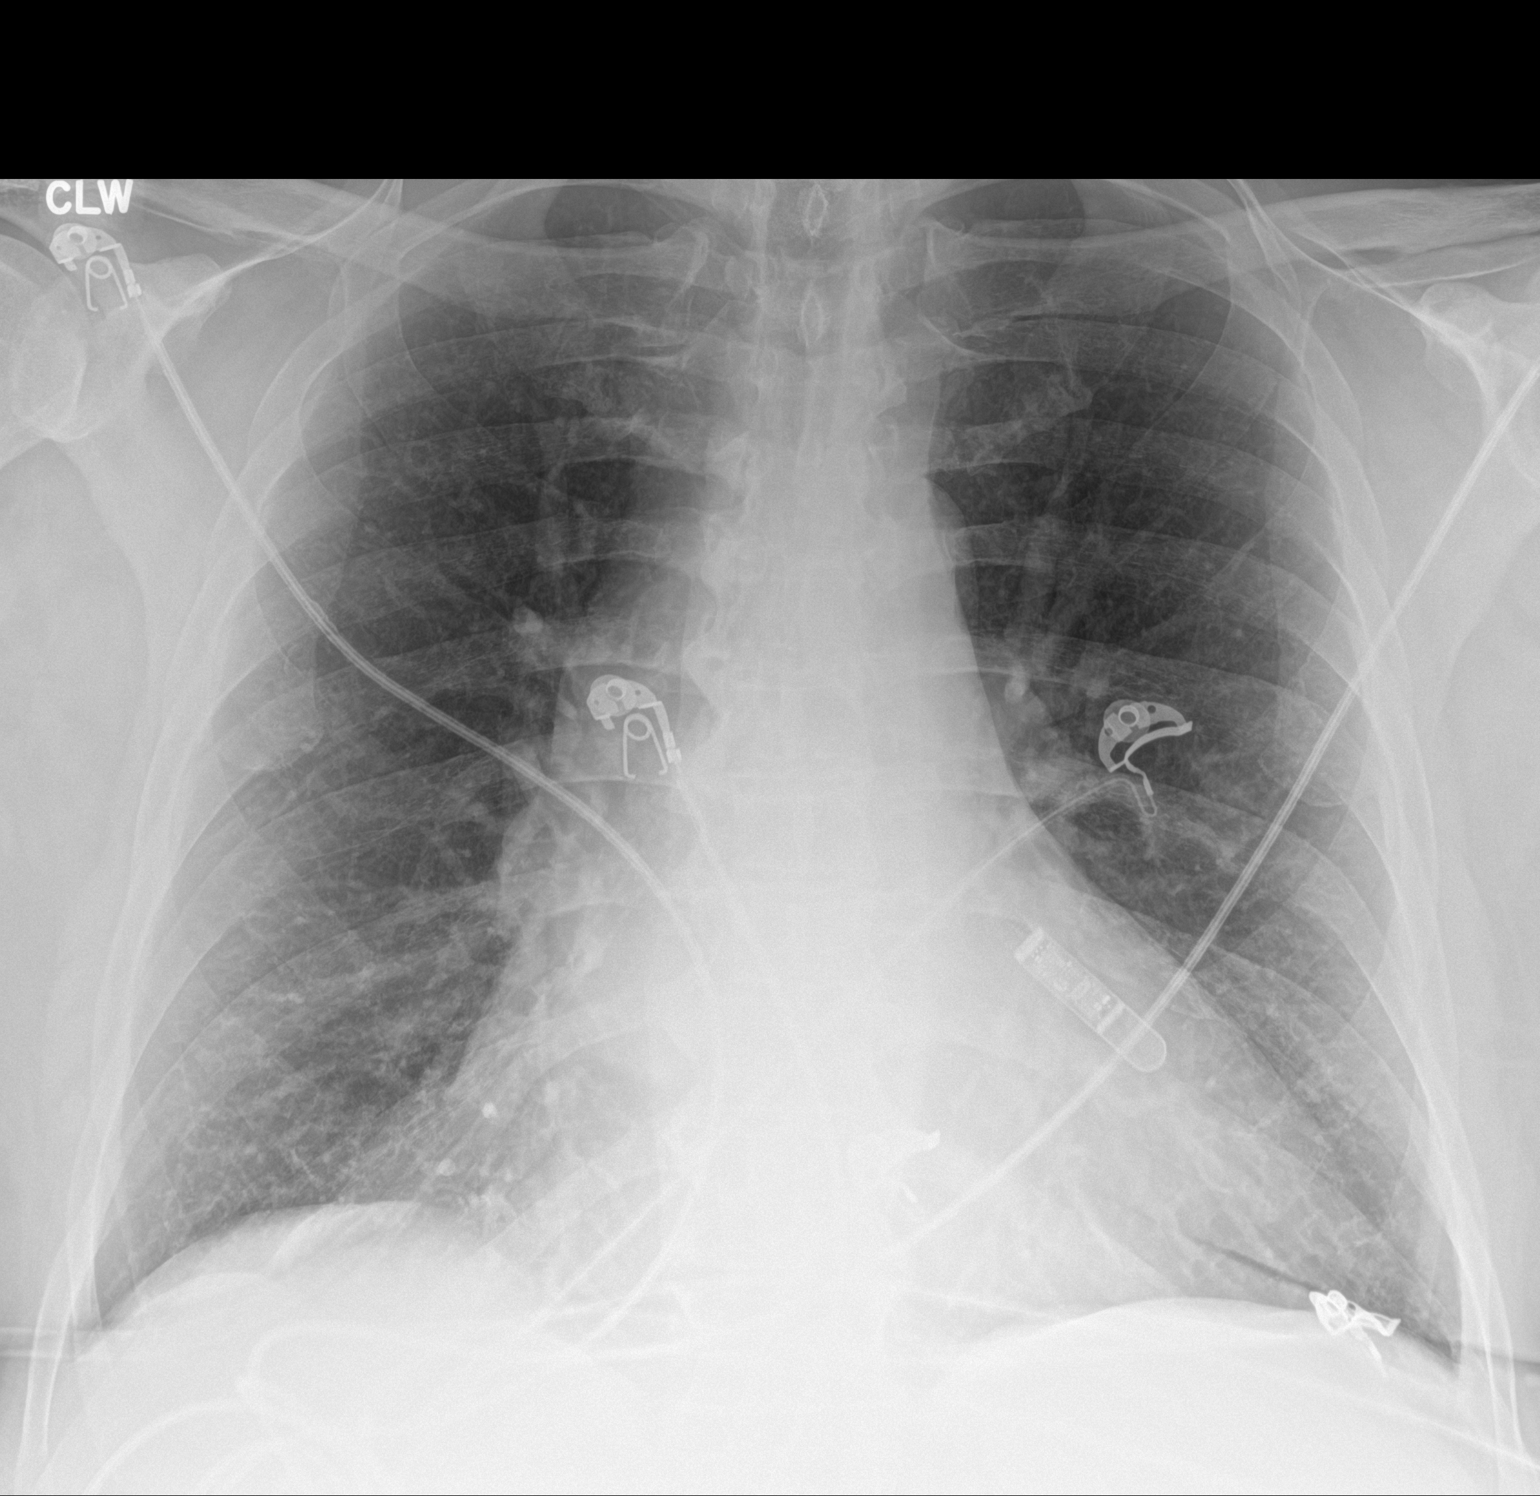

[1 of 1 positions shown; findings below may reference images not displayed]

FINDINGS: Transverse diameter of heart is increased. There are no signs of
pulmonary edema or focal pulmonary consolidation. There is minimal
blunting of left lateral CP angle. There is no pneumothorax. Cardiac
monitoring device is seen in the left chest wall.
IMPRESSION: Cardiomegaly. There are no signs of pulmonary edema or focal
pulmonary consolidation.

## 2021-08-15 MED ORDER — ASPIRIN 81 MG PO CHEW: 81.0000 mg | CHEWABLE_TABLET | Freq: Every day | ORAL | 0 refills | Status: DC

## 2021-08-15 MED ORDER — NITROGLYCERIN 2 % TD OINT
1.0000 [in_us] | TOPICAL_OINTMENT | Freq: Once | TRANSDERMAL | Status: AC
Start: 2021-08-15 — End: 2021-08-15
  Administered 2021-08-15: 1 [in_us] via TOPICAL
  Filled 2021-08-15: qty 1

## 2021-08-15 MED ORDER — MORPHINE SULFATE (PF) 4 MG/ML IV SOLN
4.0000 mg | Freq: Once | INTRAVENOUS | Status: AC
Start: 2021-08-15 — End: 2021-08-15
  Administered 2021-08-15: 4 mg via INTRAVENOUS
  Filled 2021-08-15: qty 1

## 2021-08-15 NOTE — ED Provider Notes (Signed)
[facility] EMERGENCY DEPARTMENT Provider Note   CSN: [id number] Arrival date & time: 08/15/21  1516     History  Chief Complaint  Patient presents with   Chest Pain    Samuel Williams is a 51 y.o. male.   Chest Pain Associated symptoms: back pain and cough   Associated symptoms: no abdominal pain, no dizziness, no fever, no headache, no nausea, no numbness, no palpitations, no shortness of breath, no vomiting and no weakness   Patient presents for chest pain.  Onset of chest pain was yesterday while he was doing physical work.  He reports that the chest pain improved yesterday evening, although it never went away.  This morning, chest pain was mild.  Later in the day, he was moving drywall and he experienced worsening severity of this chest pain.  Chest pain is located on the right side of chest.  It does radiate to right shoulder and back.  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  He also states that his current symptoms are reminiscent of his symptoms during those MIs.  He endorses a cough that started yesterday, with the onset of chest pain.  Chest pain is worsened by coughing and movement.  Prior to arrival, he did receive 324 of ASA as well as a SL NTG.  He does report improvement in pain with NTG.    Home Medications Prior to Admission medications   Medication Sig Start Date End Date Taking? Authorizing Provider  albuterol (VENTOLIN HFA) 108 (90 Base) MCG/ACT inhaler Inhale into the lungs every 6 (six) hours as needed for wheezing or shortness of breath.   Yes [provider]  aspirin 81 MG chewable tablet Chew 1 tablet (81 mg total) by mouth daily. 08/15/21  Yes Wynona Dove A, DO  diltiazem (CARDIZEM CD) 300 MG 24 hr capsule Take 300 mg by mouth at bedtime. 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   Yes [provider]  hydrOXYzine (ATARAX/VISTARIL) 25 MG tablet Take 1 tablet (25 mg total) by mouth every 6 (six) hours as needed (anxiety/agitation or CIWA < or = 10). Patient taking differently: Take 25 mg by mouth every 6 (six) hours as needed for anxiety. 08/13/18  Yes Connye Burkitt, NP  ibuprofen (ADVIL) 200 MG tablet Take 400 mg by mouth every 6 (six) hours as needed (pain).   Yes [provider]  insulin glargine (LANTUS) 100 unit/mL SOPN Inject 12 Units into the skin at bedtime.   Yes [provider]  insulin lispro (HUMALOG KWIKPEN) 100 UNIT/ML KwikPen Inject 4-8 Units into the skin See admin instructions. Inject 4-8 units subcutaneously twice daily as needed for CBG >150 (per sliding scale based on sugars)   Yes [provider]  metoprolol succinate (TOPROL-XL) 50 MG 24 hr tablet Take 50 mg by mouth every morning. 04/29/20  Yes [provider]  Multiple Vitamin (MULTIVITAMIN WITH MINERALS) TABS tablet Take 1 tablet by mouth daily.   Yes [provider]  primidone (MYSOLINE) 50 MG tablet Take 1 tablet (50 mg total) by mouth at bedtime. For essential tremor Patient taking differently: Take 100 mg by mouth in the morning and at bedtime. For essential tremor 08/13/18  Yes Connye Burkitt, NP  losartan (COZAAR) 100 MG tablet Take 100 mg by mouth daily. Patient  not taking: Reported on 08/15/2021 04/29/20   [provider]  nitroGLYCERIN (NITROSTAT) 0.4 MG SL tablet Place 0.4 mg under the tongue every 5 (five) minutes as needed for chest pain.    [provider]      Allergies    Bee venom, Mushroom extract complex, Ambien [zolpidem], and Protonix [pantoprazole sodium]    Review of Systems   Review of Systems  Constitutional:  Negative for chills and fever.  HENT:  Negative for ear pain and sore throat.   Eyes:  Negative for pain and visual disturbance.  Respiratory:  Positive for cough. Negative for  chest tightness, shortness of breath and wheezing.   Cardiovascular:  Positive for chest pain. Negative for palpitations and leg swelling.  Gastrointestinal:  Negative for abdominal pain, diarrhea, nausea and vomiting.  Genitourinary:  Negative for dysuria and hematuria.  Musculoskeletal:  Positive for arthralgias, back pain and neck pain. Negative for myalgias.  Skin:  Negative for color change and rash.  Neurological:  Negative for dizziness, seizures, syncope, weakness, light-headedness, numbness and headaches.  Hematological:  Does not bruise/bleed easily.  Psychiatric/Behavioral:  Negative for confusion and decreased concentration.   All other systems reviewed and are negative.  Physical Exam Updated Vital Signs BP (!) 155/114 (BP Location: Right Arm)    Pulse 90    Temp 98.4 F (36.9 C) (Oral)    Resp 16    Ht 5\' 8"  (1.727 m)    Wt 88.9 kg    SpO2 97%    BMI 29.80 kg/m  Physical Exam Vitals and nursing note reviewed.  Constitutional:      General: He is not in acute distress.    Appearance: He is well-developed and normal weight. He is not ill-appearing, toxic-appearing or diaphoretic.  HENT:     Head: Normocephalic and atraumatic.  Eyes:     Conjunctiva/sclera: Conjunctivae normal.  Neck:     Vascular: No JVD.  Cardiovascular:     Rate and Rhythm: Normal rate and regular rhythm.     Heart sounds: No murmur heard. Pulmonary:     Effort: Pulmonary effort is normal. No tachypnea or respiratory distress.     Breath sounds: Normal breath sounds. No decreased breath sounds, wheezing, rhonchi or rales.  Chest:     Chest wall: No tenderness.  Abdominal:     Palpations: Abdomen is soft.     Tenderness: There is no abdominal tenderness.  Musculoskeletal:        General: No swelling.     Cervical back: Normal range of motion and neck supple.     Right lower leg: No edema.     Left lower leg: No edema.  Skin:    General: Skin is warm and dry.     Capillary Refill: Capillary  refill takes less than 2 seconds.  Neurological:     General: No focal deficit present.     Mental Status: He is alert and oriented to person, place, and time.     Cranial Nerves: No cranial nerve deficit.     Motor: No weakness.  Psychiatric:        Mood and Affect: Mood normal.        Behavior: Behavior normal.    ED Results / Procedures / Treatments   Labs (all labs ordered are listed, but only abnormal results are displayed) Labs Reviewed  COMPREHENSIVE METABOLIC PANEL - Abnormal; Notable for the following components:      Result Value   Glucose, Bld  111 (*)    AST 61 (*)    ALT 48 (*)    All other components within normal limits  CBC WITH DIFFERENTIAL/PLATELET - Abnormal; Notable for the following components:   WBC 12.5 (*)    Lymphs Abs 4.4 (*)    All other components within normal limits  MAGNESIUM  LIPASE, BLOOD  D-DIMER, QUANTITATIVE (NOT AT [facility])  PROTIME-INR  TROPONIN I (HIGH SENSITIVITY)  TROPONIN I (HIGH SENSITIVITY)    EKG EKG Interpretation  Date/Time:  Monday August 15 2021 15:23:27 EST Ventricular Rate:  87 PR Interval:  148 QRS Duration: 81 QT Interval:  356 QTC Calculation: 429 R Axis:   24 Text Interpretation: Sinus rhythm Confirmed by Godfrey Pick [phone]) on 08/15/2021 3:24:29 PM  Radiology DG Chest Portable 1 View  Result Date: 08/15/2021 CLINICAL DATA:  Chest pain EXAM: PORTABLE CHEST 1 VIEW COMPARISON:  11/27/2020 FINDINGS: Transverse diameter of heart is increased. There are no signs of pulmonary edema or focal pulmonary consolidation. There is minimal blunting of left lateral CP angle. There is no pneumothorax. Cardiac monitoring device is seen in the left chest wall. IMPRESSION: Cardiomegaly. There are no signs of pulmonary edema or focal pulmonary consolidation. Electronically Signed   By: Elmer Picker M.D.   On: 08/15/2021 16:04    Procedures Procedures    Medications Ordered in ED Medications  nitroGLYCERIN (NITROGLYN) 2 %  ointment 1 inch (1 inch Topical Given 08/15/21 1723)  morphine 4 MG/ML injection 4 mg (4 mg Intravenous Given 08/15/21 1558)    ED Course/ Medical Decision Making/ A&P Clinical Course as of 08/16/21 2003  Mon Aug 15, 2021  1640 Cp, hx prior MI, nitro/asa by EMS improved pain; pain returned on arrival. Pleuritic cp as well.  [SG]    Clinical Course User Index [SG] Jeanell Sparrow, DO                           Medical Decision Making  Patient is a 51 year old male with history of CAD, presenting for acute onset of chest pain yesterday.  It diminished in severity overnight but then returned today.  It does appear to worsen with exertion and improved with rest.  Additionally, I did improve with SL NTG, that he was given prior to arrival.  Patient also was given 324 of ASA prior to arrival.  On exam, patient is well-appearing.  Vital signs are notable for moderate hypertension.  EKG shows no evidence of acute ischemia.  Nitro ointment and morphine were ordered for analgesia.  Laboratory work-up, including troponins, was initiated.  Care of patient was signed out to oncoming ED provider.        Final Clinical Impression(s) / ED Diagnoses Final diagnoses:  Chest wall pain    Rx / DC Orders ED Discharge Orders          Ordered    aspirin 81 MG chewable tablet  Daily        08/15/21 1837              Godfrey Pick, MD 08/16/21 2005

## 2021-08-15 NOTE — ED Triage Notes (Signed)
Pt arrived via [facility] from home. Per EMS pt c/o chest tightness and SOB since 1600 yesterday. Pt states pain radiates to R shoulder and back. Pt has hx of MI x5. Pt states pain is similar to first MI. Pt also c/o non productive cough and that pain worsens on taking a deep breath. EMS admin nitro 0.4mg  sl x1 and aspirin 324mg  PO which relieved pain but pain began to increase on arrival to ED. Pt caox4 stating pain is currently 5/10.

## 2021-08-15 NOTE — Discharge Instructions (Addendum)
Please follow up with your cardiologist regarding your chest pain.

## 2021-08-15 NOTE — ED Notes (Signed)
Pt states he is leaving AMA. Pt states he understands the risks of leaving AMA and still chooses to do so. IV was removed and last set of VS taken as noted. Pt denied any chest pain or SOB at time of leaving AMA. Pt ambulated without difficulty or need of assistance.

## 2021-08-15 NOTE — ED Provider Notes (Signed)
°  Provider Note MRN:  [id number]  Arrival date & time: 08/15/21    ED Course and Medical Decision Making  Assumed care from [facility] at shift change.  See not from prior team for complete details, in brief: 51 yo male to ED from home 2/2 cp, dib x 1 day. Pain radiation to his shoulder. A/w cough. Pain described as pleuritic, was given ASA and nitro en route.   Plan per prior physician labs, re-assess for dispo  Labs reviewed at this time and were stable, dimer was negative, initial trop was negative, CXR stable, EKG stable. His pain has improved.  However, pt does not want to stay in ED for delta trop or for further eval, will AMA. Advised him to f/u with cardiology as outpatient, start daily ASA, return to ED if he changes his mind and would like to complete workup. His CP/dib has resolved at this time.   The patient has requested to leave the ED against medical advice. I believe this patient is of sound mind and medical decision making capacity to refuse medical care. The patient is responding and asking questions appropriately. The patient is oriented to person, place and time. The patient is not psychotic, delusional, suicidal, homicidal or hallucinating. The patient demonstrates a normal mental capacity to make decisions regarding their healthcare. The patient is clinically sober and does not appear to be under the influence of any illicit drugs at this time. The patient has been advised of the risks, in layman terms, of leaving AMA which include, but are not limited to death, coma, permanent disability, loss of current lifestyle, delay in diagnosis. Alternatives have been offered - the patient remains steadfast in their wish to leave. The patient has been advised that should they change their mind they are welcome to return to this hospital, or any other, at any time. The patient understands that in no way does an AMA discharge mean that I do not want them to have the best medical care available. To  this end, I have offered appropriate prescriptions, referrals, and discharge instructions. The patient did sign AMA paperwork. The above discussion was witnessed by another member of staff.   Procedures  Final Clinical Impressions(s) / ED Diagnoses     ICD-10-CM   1. Chest wall pain  R07.89       ED Discharge Orders          Ordered    aspirin 81 MG chewable tablet  Daily        08/15/21 1837              Discharge Instructions      Please follow-up with your cardiologist regarding your chest pain.          Sloan Leiter, DO 08/15/21 2315

## 2022-01-22 ENCOUNTER — Other Ambulatory Visit: Payer: Self-pay

## 2022-01-22 ENCOUNTER — Emergency Department (HOSPITAL_COMMUNITY): Payer: Medicaid Other

## 2022-01-22 ENCOUNTER — Emergency Department (HOSPITAL_COMMUNITY)
Admission: EM | Admit: 2022-01-22 | Discharge: 2022-01-22 | Disposition: A | Discharge status: Home / Self Care | Payer: Medicaid Other | Attending: Emergency Medicine | Admitting: Emergency Medicine

## 2022-01-22 DIAGNOSIS — H539 Unspecified visual disturbance: Secondary | ICD-10-CM

## 2022-01-22 DIAGNOSIS — Z7982 Long term (current) use of aspirin: Secondary | ICD-10-CM | POA: Insufficient documentation

## 2022-01-22 DIAGNOSIS — Z794 Long term (current) use of insulin: Secondary | ICD-10-CM | POA: Insufficient documentation

## 2022-01-22 DIAGNOSIS — R209 Unspecified disturbances of skin sensation: Secondary | ICD-10-CM | POA: Diagnosis not present

## 2022-01-22 DIAGNOSIS — R29898 Other symptoms and signs involving the musculoskeletal system: Secondary | ICD-10-CM

## 2022-01-22 DIAGNOSIS — H532 Diplopia: Secondary | ICD-10-CM | POA: Insufficient documentation

## 2022-01-22 DIAGNOSIS — I639 Cerebral infarction, unspecified: Secondary | ICD-10-CM | POA: Diagnosis not present

## 2022-01-22 DIAGNOSIS — R399 Unspecified symptoms and signs involving the genitourinary system: Secondary | ICD-10-CM

## 2022-01-22 DIAGNOSIS — R202 Paresthesia of skin: Secondary | ICD-10-CM

## 2022-01-22 DIAGNOSIS — Y906 Blood alcohol level of 120-199 mg/100 ml: Secondary | ICD-10-CM | POA: Diagnosis not present

## 2022-01-22 DIAGNOSIS — R7401 Elevation of levels of liver transaminase levels: Secondary | ICD-10-CM | POA: Insufficient documentation

## 2022-01-22 DIAGNOSIS — E119 Type 2 diabetes mellitus without complications: Secondary | ICD-10-CM | POA: Insufficient documentation

## 2022-01-22 DIAGNOSIS — Z79899 Other long term (current) drug therapy: Secondary | ICD-10-CM | POA: Diagnosis not present

## 2022-01-22 DIAGNOSIS — R531 Weakness: Secondary | ICD-10-CM | POA: Diagnosis not present

## 2022-01-22 DIAGNOSIS — Y732 Prosthetic and other implants, materials and accessory gastroenterology and urology devices associated with adverse incidents: Secondary | ICD-10-CM | POA: Insufficient documentation

## 2022-01-22 DIAGNOSIS — I10 Essential (primary) hypertension: Secondary | ICD-10-CM | POA: Insufficient documentation

## 2022-01-22 DIAGNOSIS — T83091A Other mechanical complication of indwelling urethral catheter, initial encounter: Secondary | ICD-10-CM | POA: Insufficient documentation

## 2022-01-22 DIAGNOSIS — R200 Anesthesia of skin: Secondary | ICD-10-CM | POA: Diagnosis not present

## 2022-01-22 DIAGNOSIS — F10920 Alcohol use, unspecified with intoxication, uncomplicated: Secondary | ICD-10-CM

## 2022-01-22 DIAGNOSIS — F10129 Alcohol abuse with intoxication, unspecified: Secondary | ICD-10-CM | POA: Diagnosis not present

## 2022-01-22 DIAGNOSIS — M79602 Pain in left arm: Secondary | ICD-10-CM | POA: Diagnosis not present

## 2022-01-22 DIAGNOSIS — R7989 Other specified abnormal findings of blood chemistry: Secondary | ICD-10-CM

## 2022-01-22 LAB — DIFFERENTIAL
Abs Immature Granulocytes: 0.06 10*3/uL (ref 0.00–0.07)
Basophils Absolute: 0.1 10*3/uL (ref 0.0–0.1)
Basophils Relative: 1 %
Eosinophils Absolute: 0.3 10*3/uL (ref 0.0–0.5)
Eosinophils Relative: 3 %
Immature Granulocytes: 1 %
Lymphocytes Relative: 35 %
Lymphs Abs: 3.4 10*3/uL (ref 0.7–4.0)
Monocytes Absolute: 0.9 10*3/uL (ref 0.1–1.0)
Monocytes Relative: 9 %
Neutro Abs: 5 10*3/uL (ref 1.7–7.7)
Neutrophils Relative %: 51 %

## 2022-01-22 LAB — I-STAT CHEM 8, ED
BUN: 11 mg/dL (ref 6–20)
Calcium, Ion: 1.06 mmol/L — ABNORMAL LOW (ref 1.15–1.40)
Chloride: 99 mmol/L (ref 98–111)
Creatinine, Ser: 0.9 mg/dL (ref 0.61–1.24)
Glucose, Bld: 386 mg/dL — ABNORMAL HIGH (ref 70–99)
HCT: 50 % (ref 39.0–52.0)
Hemoglobin: 17 g/dL (ref 13.0–17.0)
Potassium: 4.2 mmol/L (ref 3.5–5.1)
Sodium: 133 mmol/L — ABNORMAL LOW (ref 135–145)
TCO2: 20 mmol/L — ABNORMAL LOW (ref 22–32)

## 2022-01-22 LAB — ETHANOL: Alcohol, Ethyl (B): 198 mg/dL — ABNORMAL HIGH (ref <10)

## 2022-01-22 LAB — CBC
HCT: 48.1 % (ref 39.0–52.0)
Hemoglobin: 17.5 g/dL — ABNORMAL HIGH (ref 13.0–17.0)
MCH: 33.5 pg (ref 26.0–34.0)
MCHC: 36.4 g/dL — ABNORMAL HIGH (ref 30.0–36.0)
MCV: 92.1 fL (ref 80.0–100.0)
Platelets: 245 10*3/uL (ref 150–400)
RBC: 5.22 MIL/uL (ref 4.22–5.81)
RDW: 12.5 % (ref 11.5–15.5)
WBC: 9.7 10*3/uL (ref 4.0–10.5)
nRBC: 0 % (ref 0.0–0.2)

## 2022-01-22 LAB — COMPREHENSIVE METABOLIC PANEL
ALT: 48 U/L — ABNORMAL HIGH (ref 0–44)
AST: 44 U/L — ABNORMAL HIGH (ref 15–41)
Albumin: 4.1 g/dL (ref 3.5–5.0)
Alkaline Phosphatase: 103 U/L (ref 38–126)
Anion gap: 15 (ref 5–15)
BUN: 10 mg/dL (ref 6–20)
CO2: 20 mmol/L — ABNORMAL LOW (ref 22–32)
Calcium: 9.2 mg/dL (ref 8.9–10.3)
Chloride: 97 mmol/L — ABNORMAL LOW (ref 98–111)
Creatinine, Ser: 0.79 mg/dL (ref 0.61–1.24)
GFR, Estimated: >60 mL/min (ref >60)
Glucose, Bld: 376 mg/dL — ABNORMAL HIGH (ref 70–99)
Potassium: 4.6 mmol/L (ref 3.5–5.1)
Sodium: 132 mmol/L — ABNORMAL LOW (ref 135–145)
Total Bilirubin: 1.3 mg/dL — ABNORMAL HIGH (ref 0.3–1.2)
Total Protein: 6.5 g/dL (ref 6.5–8.1)

## 2022-01-22 LAB — CBG MONITORING, ED: Glucose-Capillary: 391 mg/dL — ABNORMAL HIGH (ref 70–99)

## 2022-01-22 LAB — TROPONIN I (HIGH SENSITIVITY): Troponin I (High Sensitivity): 8 ng/L (ref <18)

## 2022-01-22 LAB — PROTIME-INR
INR: 1 (ref 0.8–1.2)
Prothrombin Time: 12.9 seconds (ref 11.4–15.2)

## 2022-01-22 LAB — APTT: aPTT: 28 seconds (ref 24–36)

## 2022-01-22 LAB — VITAMIN B12: Vitamin B-12: 418 pg/mL (ref 180–914)

## 2022-01-22 IMAGING — MR MR HEAD WO/W CM
13 of 15 series · 40 of 48 positions shown · IV contrast (gadavist)
Comparison: Brain MRI [DATE]

CLINICAL DATA: Left arm pain and numbness

EXAM:
MRI HEAD WITHOUT AND WITH CONTRAST
MRI CERVICAL SPINE WITHOUT AND WITH CONTRAST
TECHNIQUE: Multiplanar, multiecho pulse sequences of the brain and surrounding
structures, and cervical spine, to include the craniocervical
junction and cervicothoracic junction, were obtained without and
with intravenous contrast.
CONTRAST:  10mL GADAVIST GADOBUTROL 1 MMOL/ML IV SOLN

[Series 5: DWI · axial · 3.0mm · 0.88mm/px · z∈[-116,+36]mm · 6 of 104 slices shown (1 of 4)]
[im 1/104]
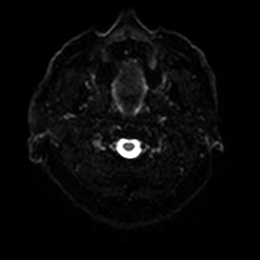
[im 21/104]
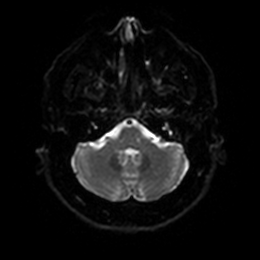
[im 42/104]
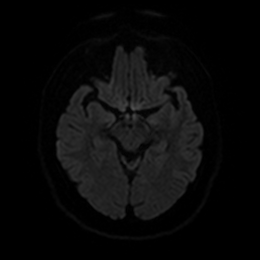
[im 62/104]
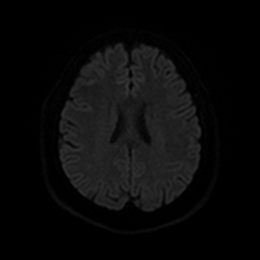
[im 83/104]
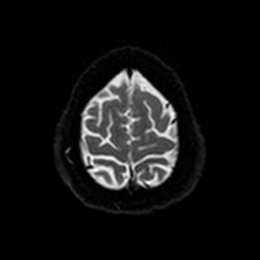
[im 104/104]
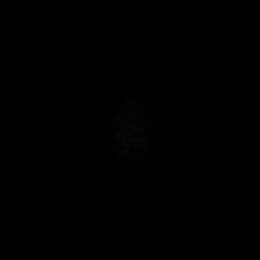

[Series 6: DWI · axial · 3.0mm · 0.88mm/px · z∈[-116,+36]mm · 3 of 52 slices shown (2 of 4)]
[im 1/52]
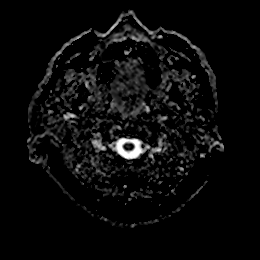
[im 26/52]
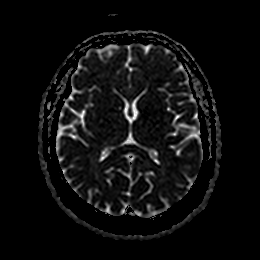
[im 52/52]
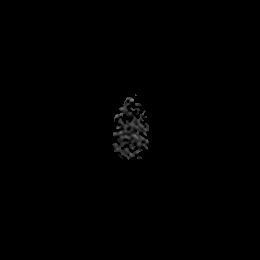

[Series 7: DWI · coronal · 4.0mm · 0.88mm/px · 5 of 72 slices shown (3 of 4)]
[im 1/72]
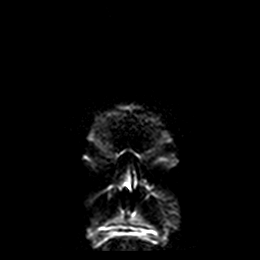
[im 18/72]
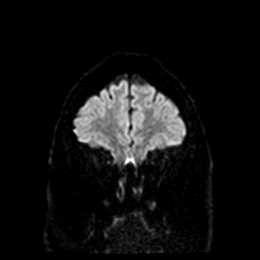
[im 36/72]
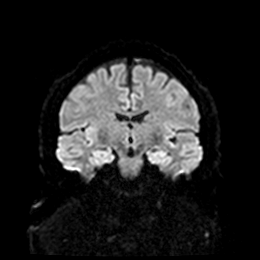
[im 54/72]
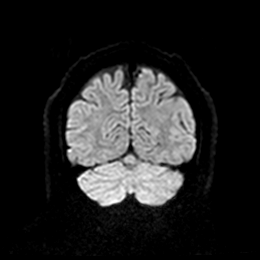
[im 72/72]
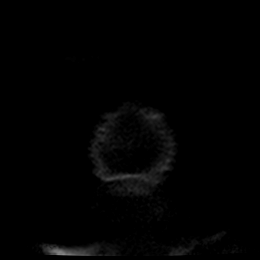

[Series 8: DWI · coronal · 4.0mm · 0.88mm/px · 2 of 35 slices shown (4 of 4)]
[im 1/35]
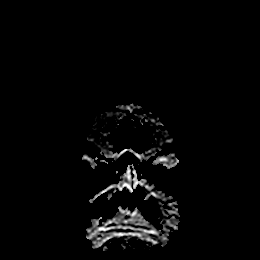
[im 35/35]
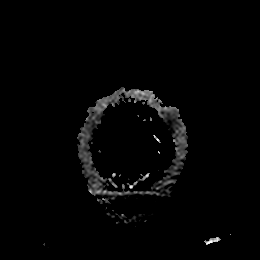

[Series 9: T1 · sagittal · 5.0mm · 0.75mm/px · 2 of 23 slices shown]
[im 1/23]
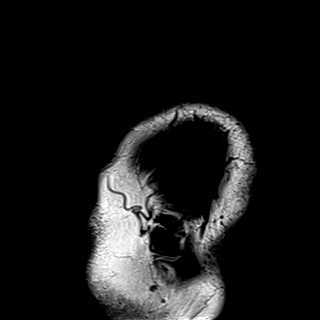
[im 23/23]
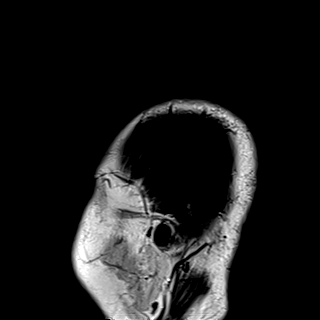

[Series 10: T2 · axial · 5.0mm · 0.72mm/px · z∈[-115,+34]mm · 2 of 26 slices shown]
[im 1/26]
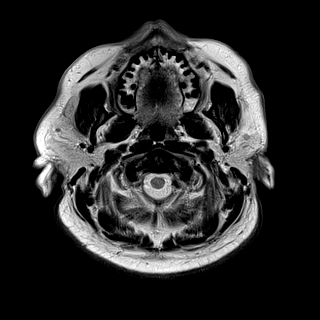
[im 26/26]
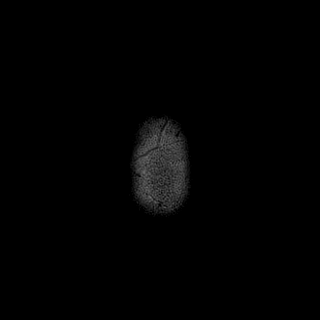

[Series 11: FLAIR · axial · 5.0mm · 0.45mm/px · z∈[-115,+33]mm · 2 of 26 slices shown]
[im 1/26]
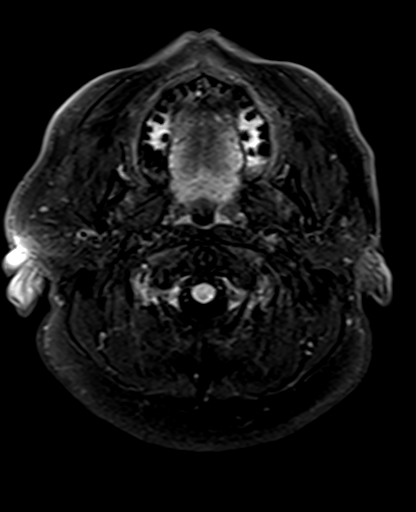
[im 26/26]
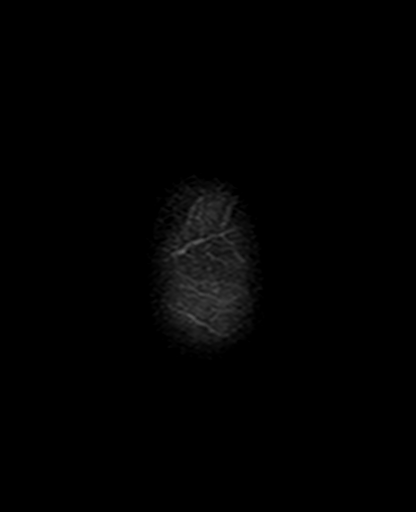

[Series 12: mag_images · axial · 3.0mm · 0.90mm/px · z∈[-117,+35]mm · 4 of 52 slices shown]
[im 1/52]
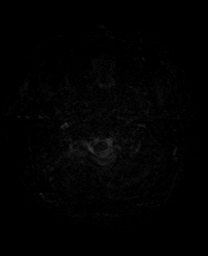
[im 18/52]
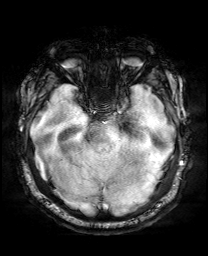
[im 35/52]
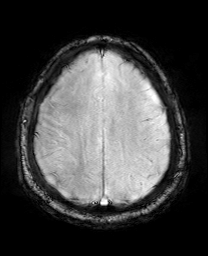
[im 52/52]
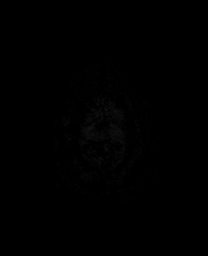

[Series 13: pha_images · axial · 3.0mm · 0.90mm/px · z∈[-114,+35]mm · 3 of 51 slices shown]
[im 1/51]
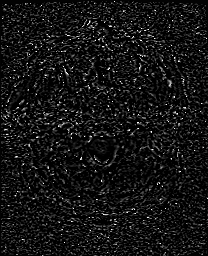
[im 26/51]
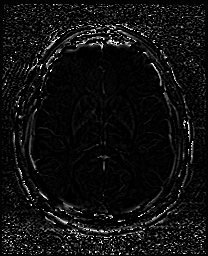
[im 51/51]
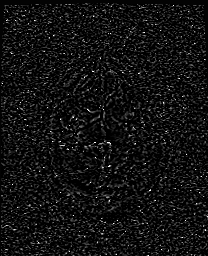

[Series 14: swi_images · axial · 3.0mm · 0.90mm/px · z∈[-117,+35]mm · 4 of 52 slices shown]
[im 1/52]
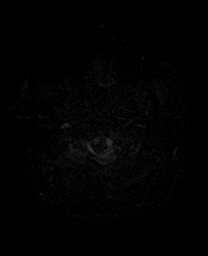
[im 18/52]
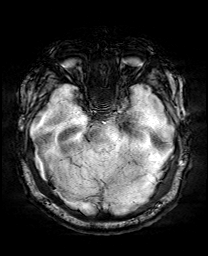
[im 35/52]
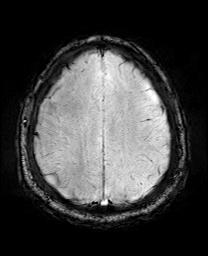
[im 52/52]
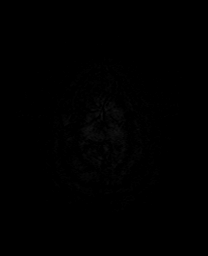

[Series 15: mip_images(sw) · axial · 24.0mm · 0.90mm/px · z∈[-107,+24]mm · 3 of 45 slices shown]
[im 1/45]
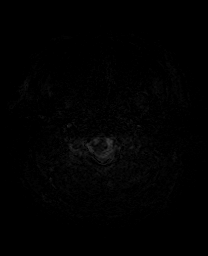
[im 23/45]
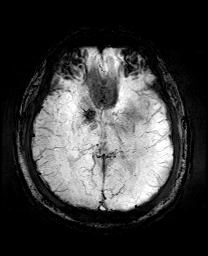
[im 45/45]
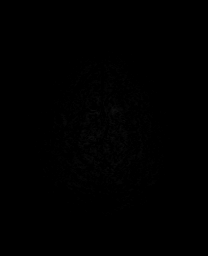

[Series 17: T2 post-contrast · coronal · 5.0mm · 0.72mm/px · 2 of 30 slices shown]
[im 1/30]
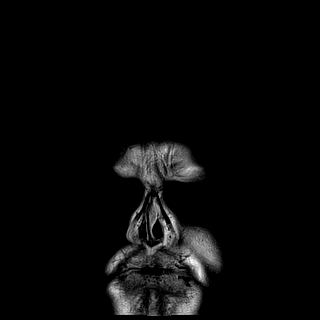
[im 30/30]
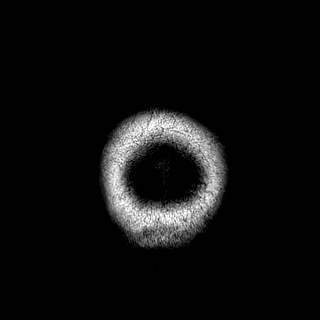

[Series 19: T1 post-contrast · coronal · 5.0mm · 0.34mm/px · 2 of 30 slices shown]
[im 1/30]
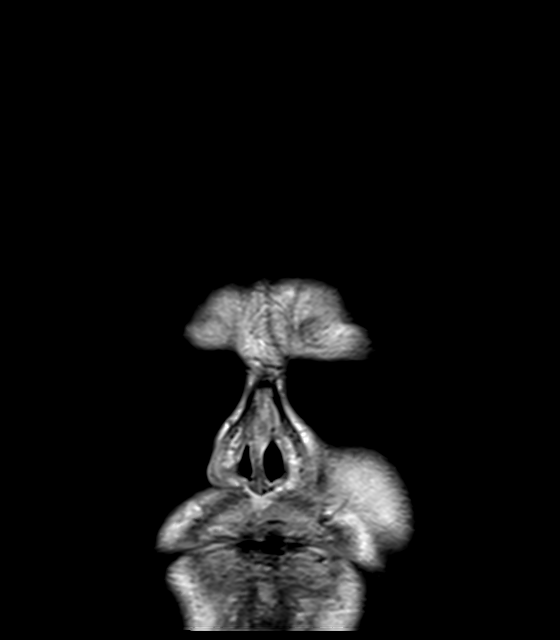
[im 30/30]
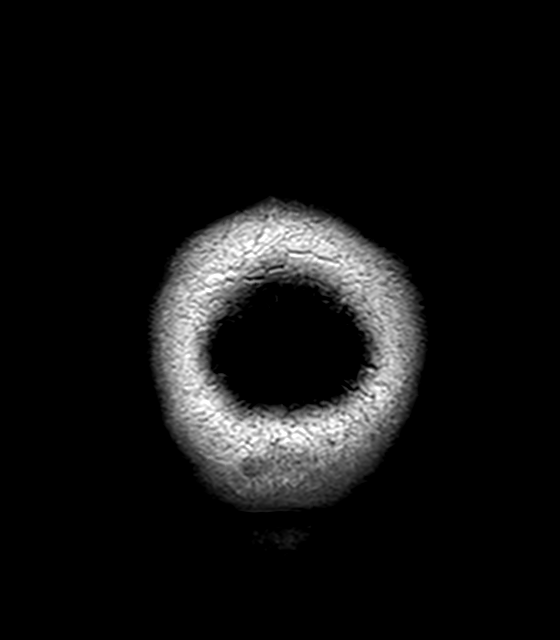

[40 of 48 positions shown; findings below may reference images not displayed]

FINDINGS: MRI HEAD FINDINGS

Brain: No acute infarct, mass effect or extra-axial collection. No
acute or chronic hemorrhage. Normal white matter signal, parenchymal
volume and CSF spaces. The midline structures are normal. There is
no abnormal contrast enhancement.

Vascular: Major flow voids are preserved.

Skull and upper cervical spine: Normal calvarium and skull base.
Visualized upper cervical spine and soft tissues are normal.

Sinuses/Orbits:No paranasal sinus fluid levels or advanced mucosal
thickening. No mastoid or middle ear effusion. Normal orbits.

MRI CERVICAL SPINE FINDINGS

Alignment: Physiologic.

Vertebrae: No fracture, evidence of discitis, or bone lesion.

Cord: Normal signal and morphology.

Posterior Fossa, vertebral arteries, paraspinal tissues: Negative.

Disc levels:

C1-2: Unremarkable.

C2-3: Mild facet hypertrophy. There is no spinal canal stenosis. No
neural foraminal stenosis.

C3-4: Small disc bulge. There is no spinal canal stenosis. No neural
foraminal stenosis.

C4-5: Small central disc protrusion. There is no spinal canal
stenosis. No neural foraminal stenosis.

C5-6: Bilateral uncovertebral hypertrophy. There is no spinal canal
stenosis. Moderate bilateral neural foraminal stenosis.

C6-7: Small left subarticular disc protrusion superimposed on disc
bulge. Mild spinal canal stenosis. Mild left neural foraminal
stenosis.

C7-T1: Normal disc space and facet joints. There is no spinal canal
stenosis. No neural foraminal stenosis.
IMPRESSION: 1. Normal MRI of the brain.
2. Moderate bilateral C5-6 neural foraminal stenosis.
3. Mild C6-7 spinal canal and left neural foraminal stenosis.

## 2022-01-22 IMAGING — MR MR CERVICAL SPINE WO/W CM
6 of 8 series · 30 of 48 positions shown · IV contrast (gadavist)
Comparison: Brain MRI [DATE]

CLINICAL DATA: Left arm pain and numbness

EXAM:
MRI HEAD WITHOUT AND WITH CONTRAST
MRI CERVICAL SPINE WITHOUT AND WITH CONTRAST
TECHNIQUE: Multiplanar, multiecho pulse sequences of the brain and surrounding
structures, and cervical spine, to include the craniocervical
junction and cervicothoracic junction, were obtained without and
with intravenous contrast.
CONTRAST:  10mL GADAVIST GADOBUTROL 1 MMOL/ML IV SOLN

[Series 11: T2 · sagittal · 3.0mm · 0.69mm/px · 3 of 15 slices shown (1 of 2)]
[im 1/15]
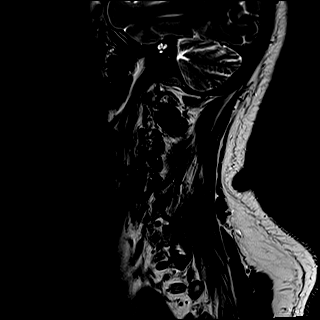
[im 8/15]
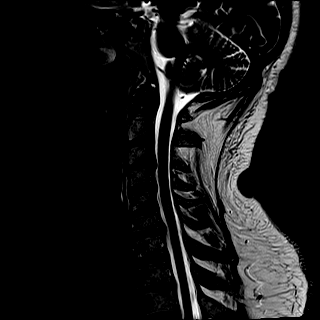
[im 15/15]
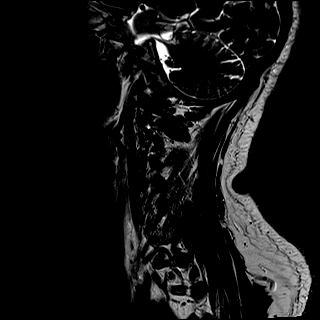

[Series 12: T1 · sagittal · 3.0mm · 0.69mm/px · 3 of 15 slices shown (1 of 2)]
[im 1/15]
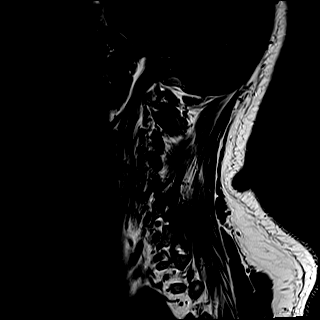
[im 8/15]
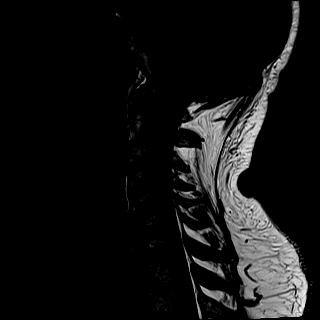
[im 15/15]
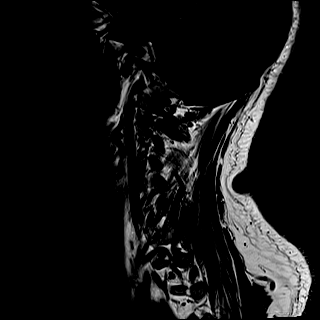

[Series 13: STIR · sagittal · 3.0mm · 0.86mm/px · 3 of 15 slices shown]
[im 1/15]
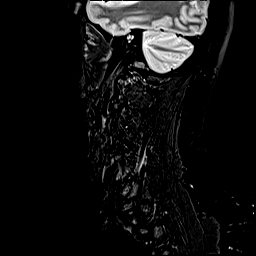
[im 8/15]
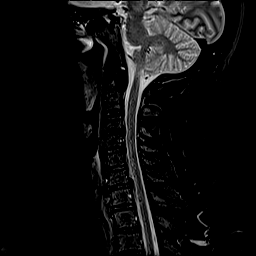
[im 15/15]
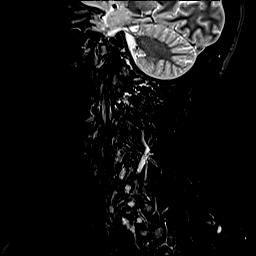

[Series 14: T2 · axial · 3.0mm · 0.66mm/px · z∈[-267,-151]mm · 9 of 40 slices shown (2 of 2)]
[im 1/40]
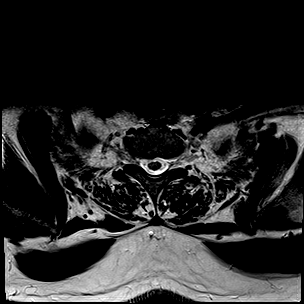
[im 5/40]
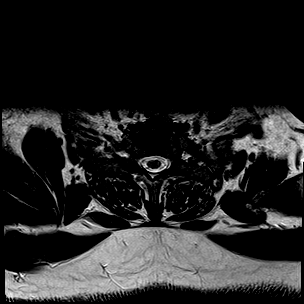
[im 10/40]
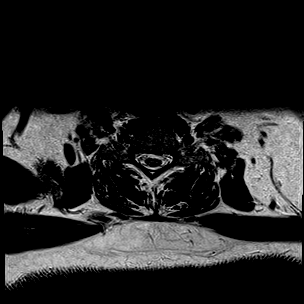
[im 15/40]
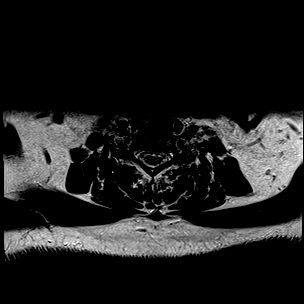
[im 20/40]
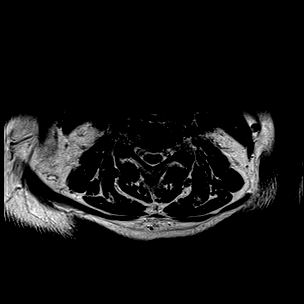
[im 25/40]
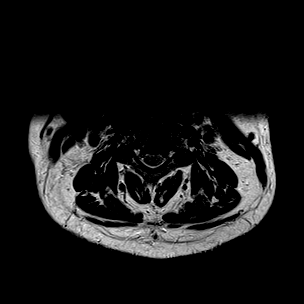
[im 30/40]
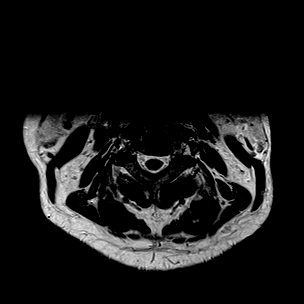
[im 35/40]
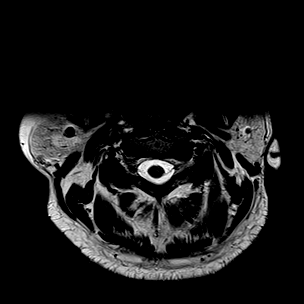
[im 40/40]
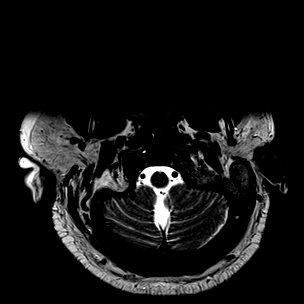

[Series 16: T1 · axial · 3.0mm · 0.39mm/px · z∈[-267,-151]mm · 9 of 40 slices shown (2 of 2)]
[im 1/40]
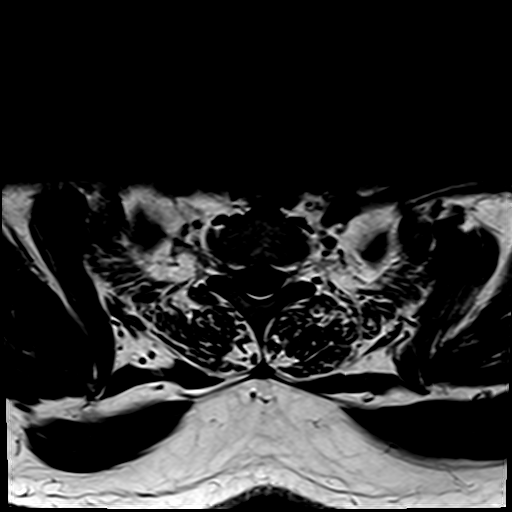
[im 5/40]
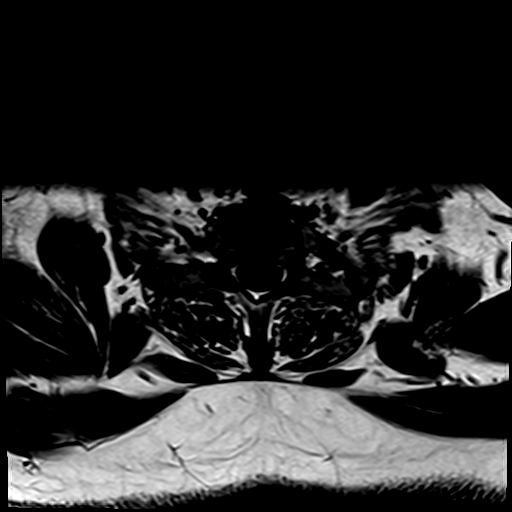
[im 10/40]
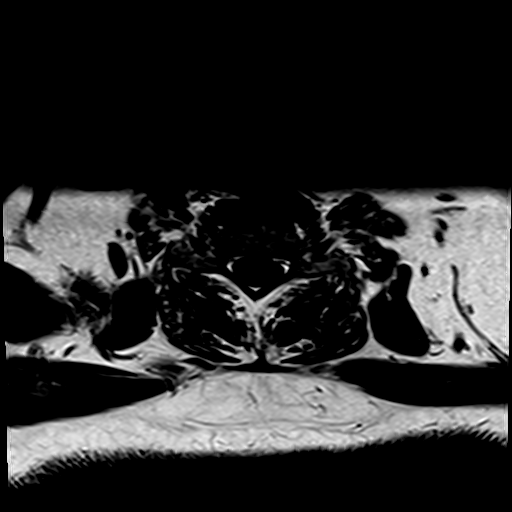
[im 15/40]
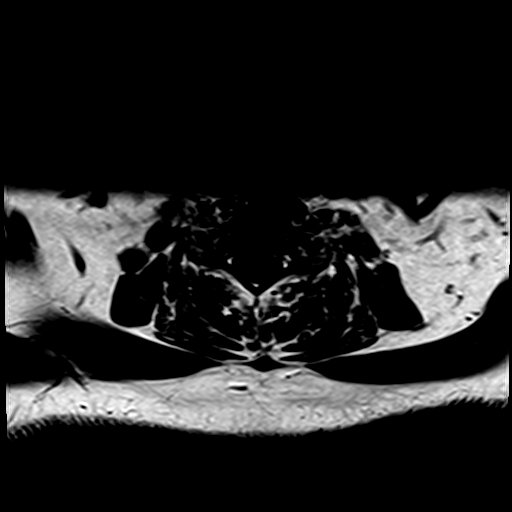
[im 20/40]
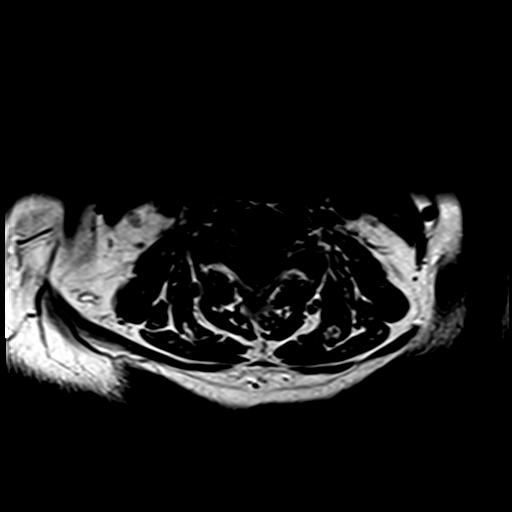
[im 25/40]
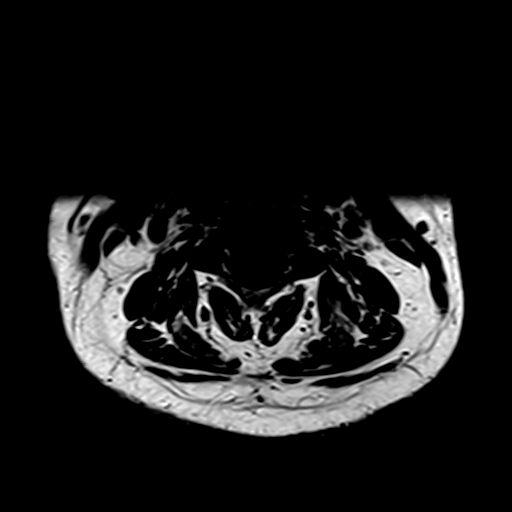
[im 30/40]
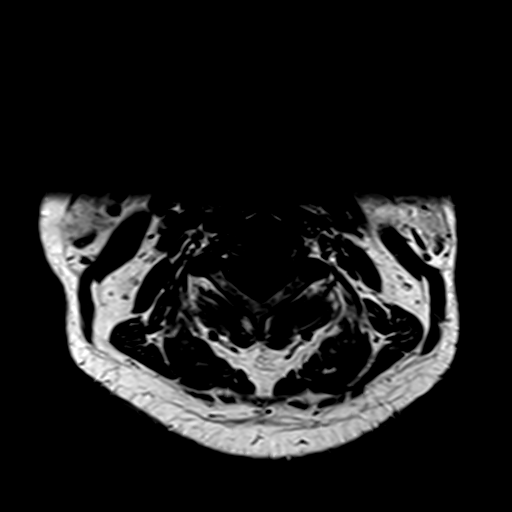
[im 35/40]
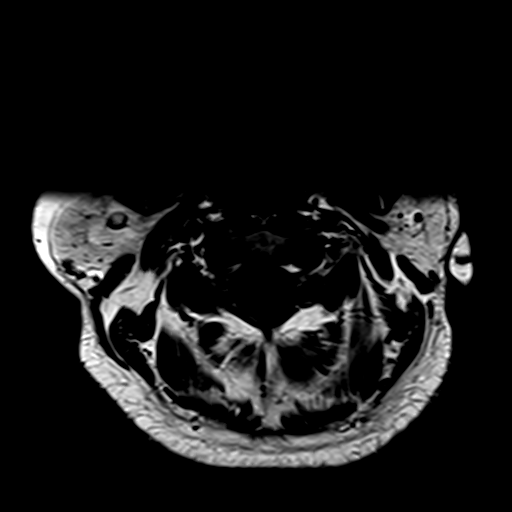
[im 40/40]
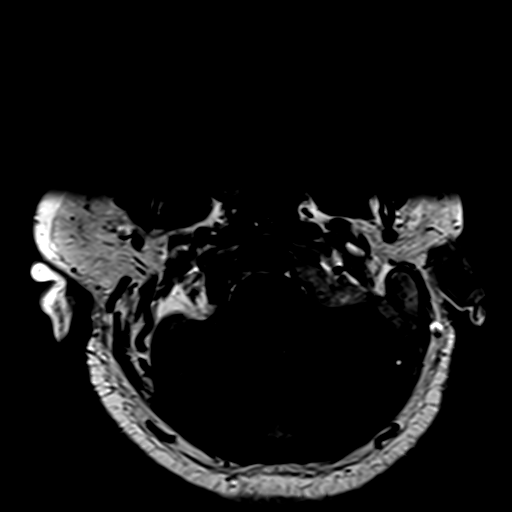

[Series 18: T1 fat-sat post-contrast · sagittal · 3.0mm · 0.43mm/px · 3 of 15 slices shown]
[im 1/15]
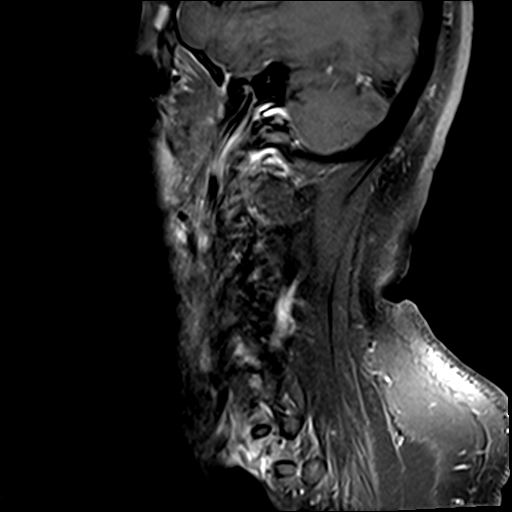
[im 8/15]
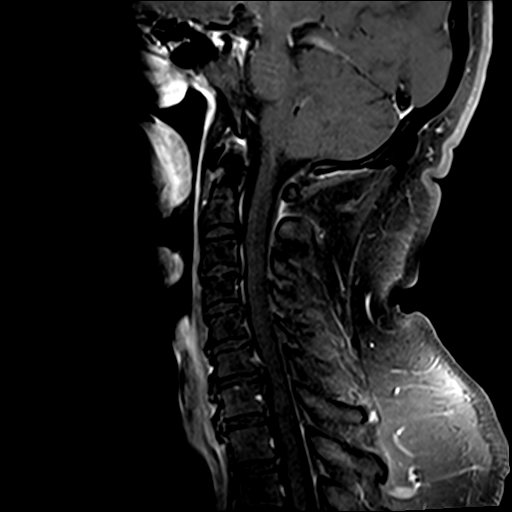
[im 15/15]
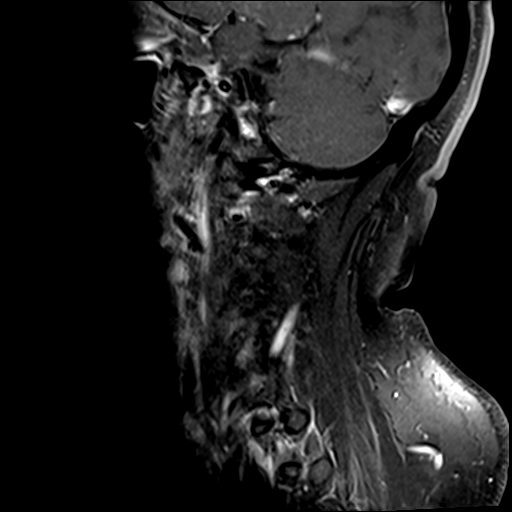

[30 of 48 positions shown; findings below may reference images not displayed]

FINDINGS: MRI HEAD FINDINGS

Brain: No acute infarct, mass effect or extra-axial collection. No
acute or chronic hemorrhage. Normal white matter signal, parenchymal
volume and CSF spaces. The midline structures are normal. There is
no abnormal contrast enhancement.

Vascular: Major flow voids are preserved.

Skull and upper cervical spine: Normal calvarium and skull base.
Visualized upper cervical spine and soft tissues are normal.

Sinuses/Orbits:No paranasal sinus fluid levels or advanced mucosal
thickening. No mastoid or middle ear effusion. Normal orbits.

MRI CERVICAL SPINE FINDINGS

Alignment: Physiologic.

Vertebrae: No fracture, evidence of discitis, or bone lesion.

Cord: Normal signal and morphology.

Posterior Fossa, vertebral arteries, paraspinal tissues: Negative.

Disc levels:

C1-2: Unremarkable.

C2-3: Mild facet hypertrophy. There is no spinal canal stenosis. No
neural foraminal stenosis.

C3-4: Small disc bulge. There is no spinal canal stenosis. No neural
foraminal stenosis.

C4-5: Small central disc protrusion. There is no spinal canal
stenosis. No neural foraminal stenosis.

C5-6: Bilateral uncovertebral hypertrophy. There is no spinal canal
stenosis. Moderate bilateral neural foraminal stenosis.

C6-7: Small left subarticular disc protrusion superimposed on disc
bulge. Mild spinal canal stenosis. Mild left neural foraminal
stenosis.

C7-T1: Normal disc space and facet joints. There is no spinal canal
stenosis. No neural foraminal stenosis.
IMPRESSION: 1. Normal MRI of the brain.
2. Moderate bilateral C5-6 neural foraminal stenosis.
3. Mild C6-7 spinal canal and left neural foraminal stenosis.

## 2022-01-22 IMAGING — CT CT ANGIO HEAD-NECK (W OR W/O PERF)
2 of 7 series · 8 of 33 positions shown · non-contrast
Comparison: None Available.

CLINICAL DATA: Neuro deficit, acute, stroke suspected. Left-sided
weakness, speech disturbance, and headache.

EXAM:
CT ANGIOGRAPHY HEAD AND NECK
TECHNIQUE: Multidetector CT imaging of the head and neck was performed using
the standard protocol during bolus administration of intravenous
contrast. Multiplanar CT image reconstructions and MIPs were
obtained to evaluate the vascular anatomy. Carotid stenosis
measurements (when applicable) are obtained utilizing NASCET
criteria, using the distal internal carotid diameter as the
denominator.

[Series 3: cta neck · axial · 0.59mm/px · z∈[-172,-56]mm · 2 of 176 slices shown]
[im 59/176  soft-tissue]
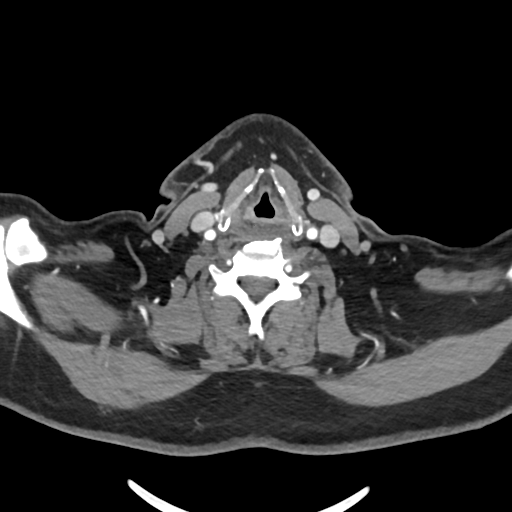
[im 117/176  soft-tissue]
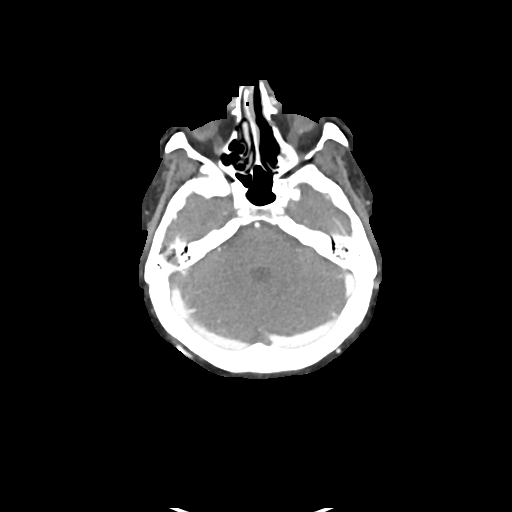

[Series 5: cta neck axial · axial · 0.41mm/px · z∈[-238,+14]mm · 6 of 353 slices shown]
[im 51/353  soft-tissue]
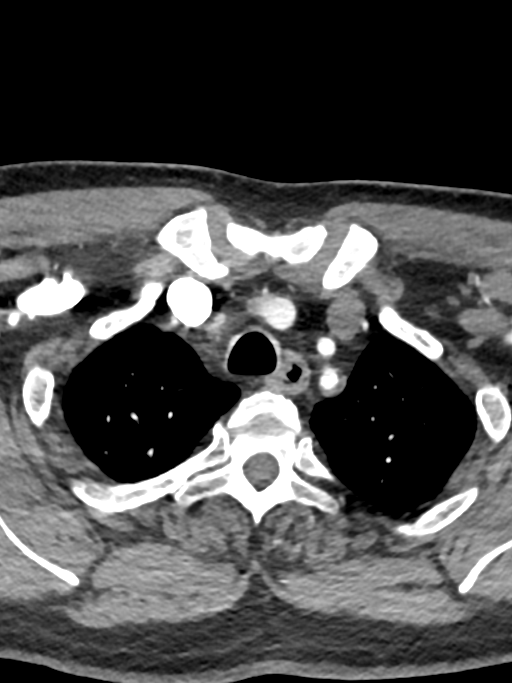
[im 101/353  bone]
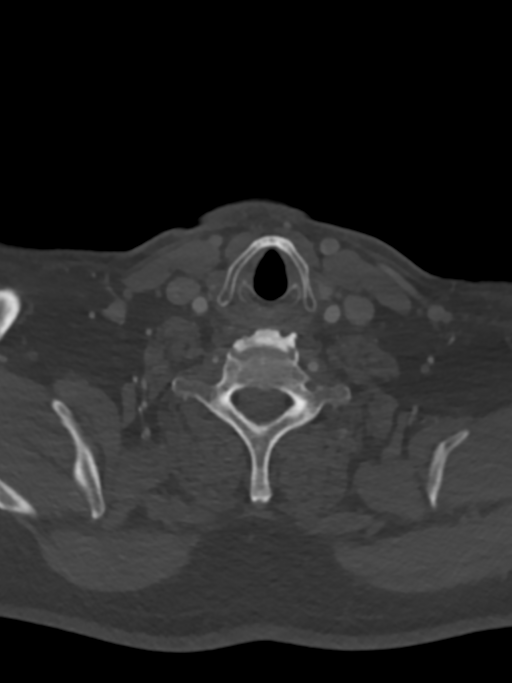
[im 151/353  soft-tissue]
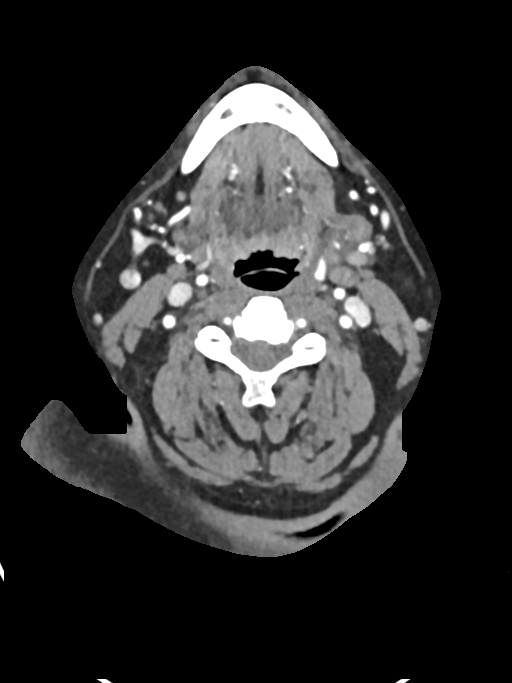
[im 202/353  bone]
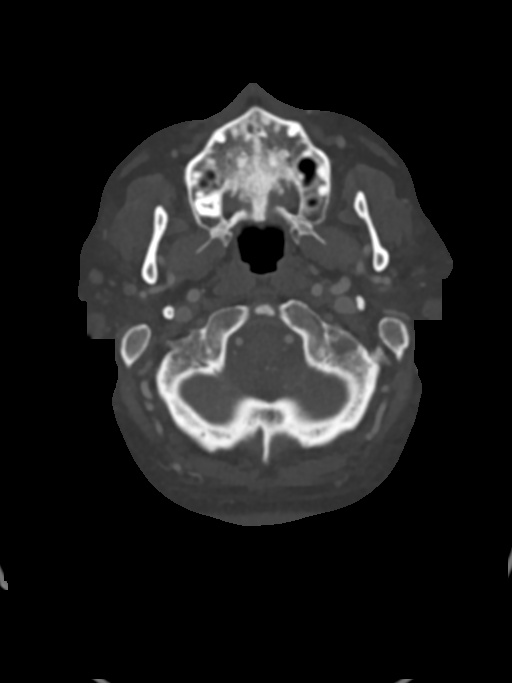
[im 252/353  soft-tissue]
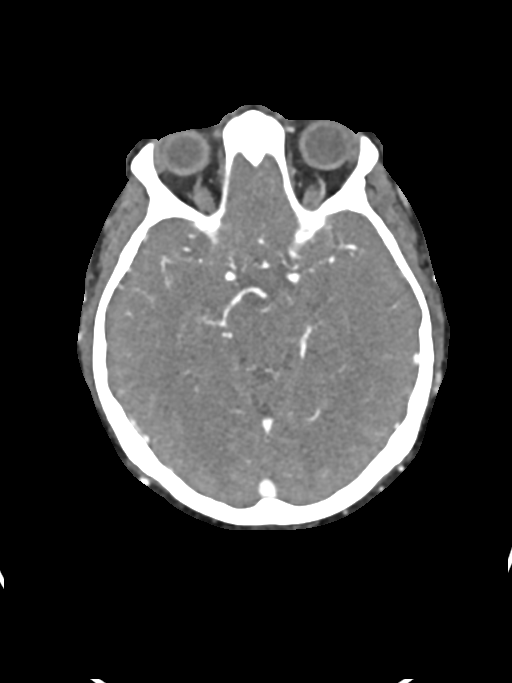
[im 302/353  bone]
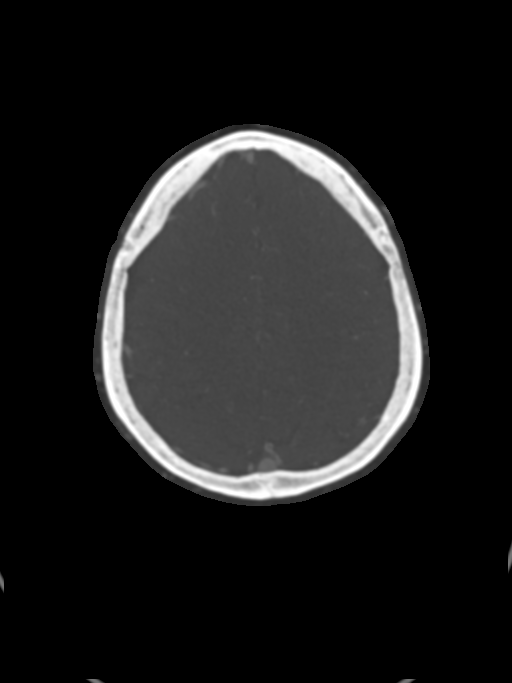

[8 of 33 positions shown; findings below may reference images not displayed]

RADIATION DOSE REDUCTION: This exam was performed according to the
departmental dose-optimization program which includes automated
exposure control, adjustment of the mA and/or kV according to
patient size and/or use of iterative reconstruction technique.

CONTRAST:  75mL OMNIPAQUE IOHEXOL 350 MG/ML SOLN
FINDINGS: CTA NECK FINDINGS

Aortic arch: Standard 3 vessel aortic arch with minimal
atherosclerotic plaque. Patent brachiocephalic and subclavian
arteries with a small to moderate amount of soft plaque in the
proximal left subclavian artery not resulting in significant
stenosis.

Right carotid system: Patent without evidence of stenosis,
dissection, or significant atherosclerosis.

Left carotid system: Patent without evidence of stenosis,
dissection, or significant atherosclerosis. Tortuous distal cervical
ICA.

Vertebral arteries: Patent without evidence of stenosis, dissection,
or significant atherosclerosis. Strongly dominant left vertebral
artery.

Skeleton: Moderate cervical spondylosis.

Other neck: No evidence of cervical lymphadenopathy or mass.

Upper chest: Clear lung apices.

Review of the MIP images confirms the above findings

CTA HEAD FINDINGS

Anterior circulation: The internal carotid arteries are widely
patent from skull base to carotid termini. ACAs and MCAs are patent
without evidence of a proximal branch occlusion or significant
proximal stenosis. No aneurysm is identified.

Posterior circulation: The intracranial vertebral arteries are
patent to the basilar patent PICA and SCA origins are seen
bilaterally. The basilar artery is widely patent with a suspected
incidental fenestration proximally. There are small right and large
left posterior communicating arteries with absence of the left P1
segment. Both PCAs are patent without evidence of a significant
proximal stenosis. No aneurysm is identified.

Venous sinuses: Patent.

Anatomic variants: Fetal left PCA.

Review of the MIP images confirms the above findings
IMPRESSION: 1. No large vessel occlusion or significant stenosis in the head and
neck.
2. Aortic Atherosclerosis ([LQ]-[LQ]).

## 2022-01-22 IMAGING — CT CT VENOGRAM HEAD
1 of 7 series · 2 of 33 positions shown · non-contrast
Comparison: None Available.

CLINICAL DATA: Dural venous sinus thrombosis suspected. Left-sided
weakness, speech disturbance, and headache.

EXAM:
CT VENOGRAM HEAD
TECHNIQUE: Venographic phase images of the brain were obtained following the
administration of intravenous contrast. Multiplanar reformats and
maximum intensity projections were generated.

[Series 6: head with · axial · 0.43mm/px · z∈[-14,+38]mm · 2 of 80 slices shown]
[im 27/80  soft-tissue]
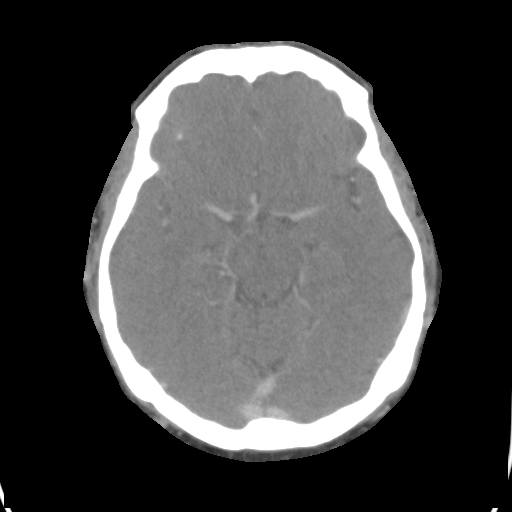
[im 53/80  bone]
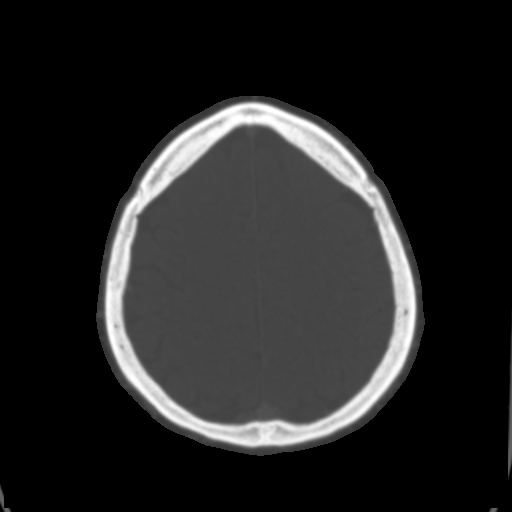

[2 of 33 positions shown; findings below may reference images not displayed]

RADIATION DOSE REDUCTION: This exam was performed according to the
departmental dose-optimization program which includes automated
exposure control, adjustment of the mA and/or kV according to
patient size and/or use of iterative reconstruction technique.

CONTRAST:  75mL OMNIPAQUE IOHEXOL 350 MG/ML SOLN
FINDINGS: The superior sagittal sinus, internal cerebral veins, vein of POLO,
straight sinus, transverse sinuses, sigmoid sinuses, and jugular
bulbs are patent without evidence of thrombus or significant
stenosis.
IMPRESSION: Negative CT venogram.

## 2022-01-22 IMAGING — DX DG CHEST 2V
2 series · 2 of 2 positions shown · non-contrast
Comparison: [DATE].

CLINICAL DATA: Left arm pain and weakness.

EXAM:
CHEST - 2 VIEW

[chest lat]
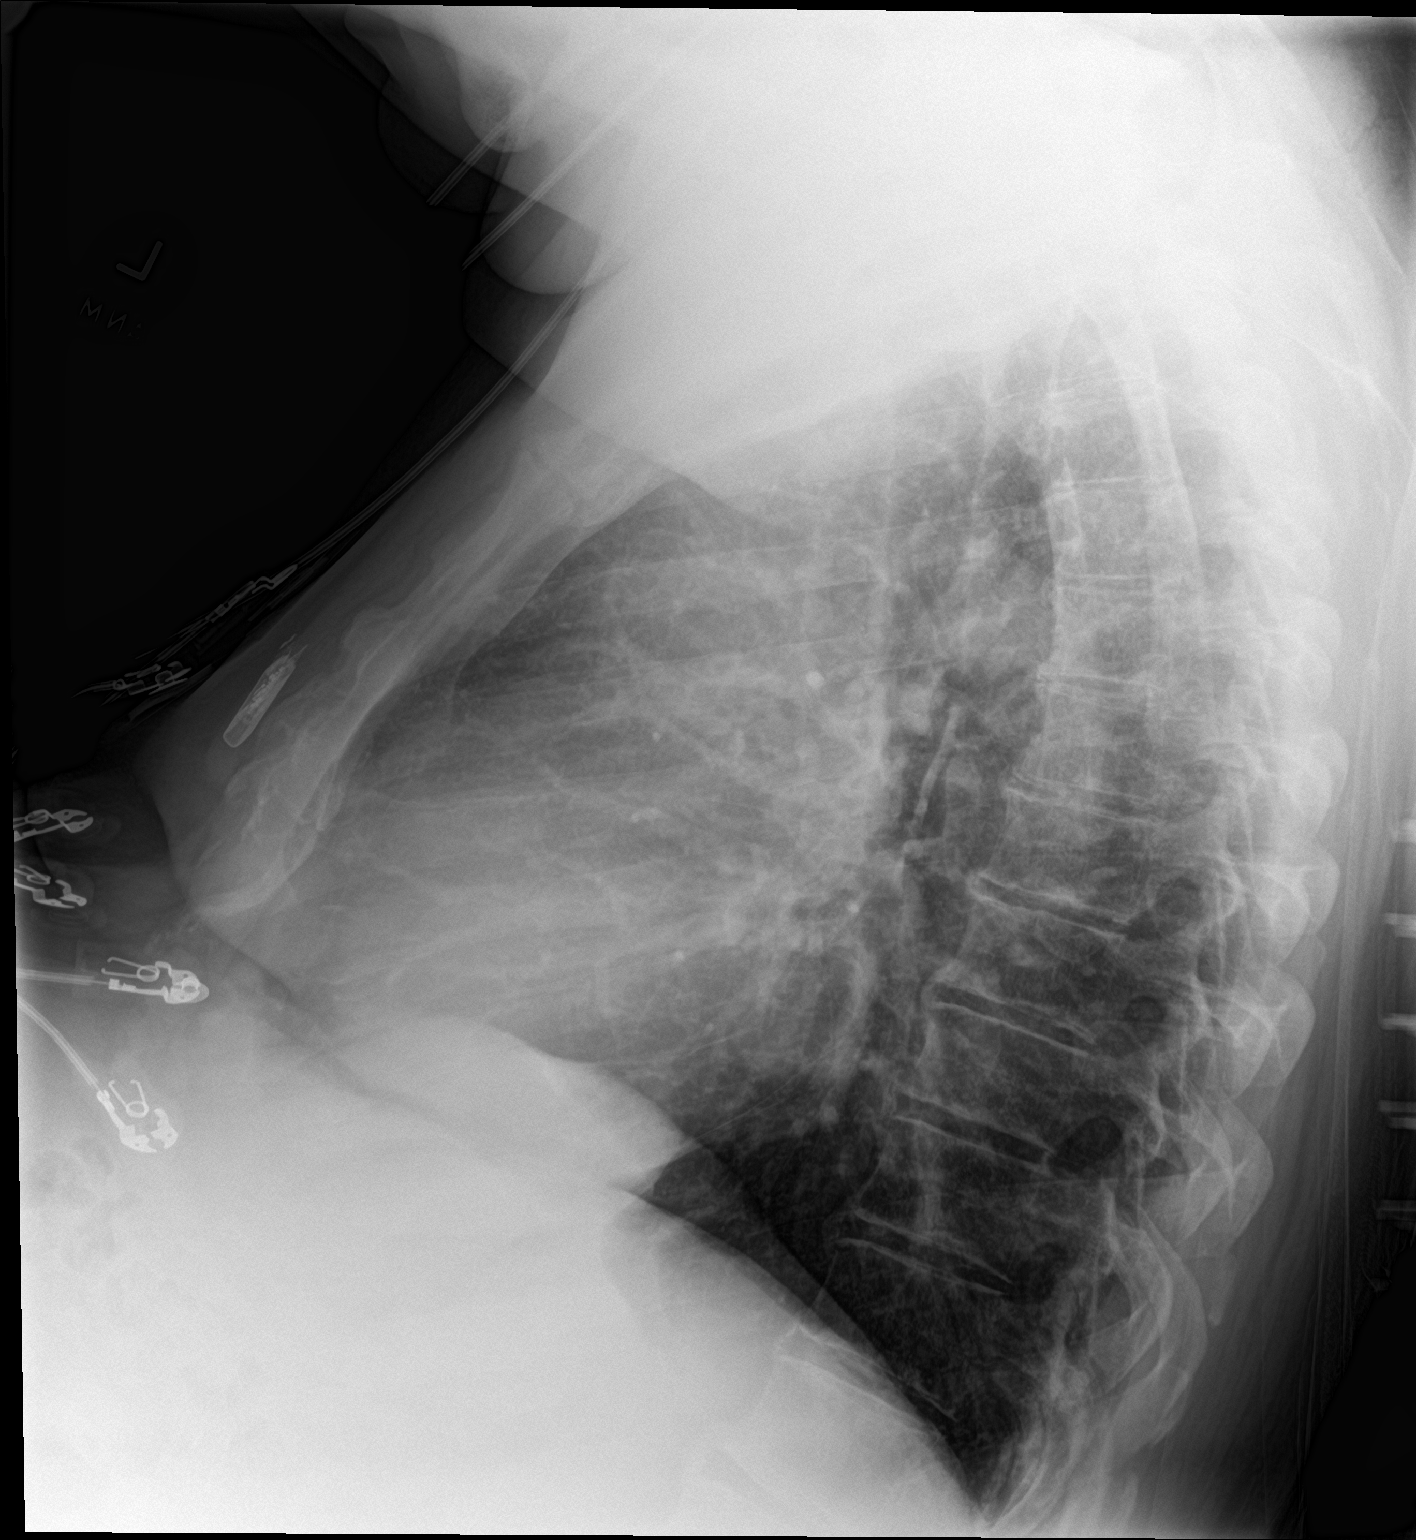

[chest ap]
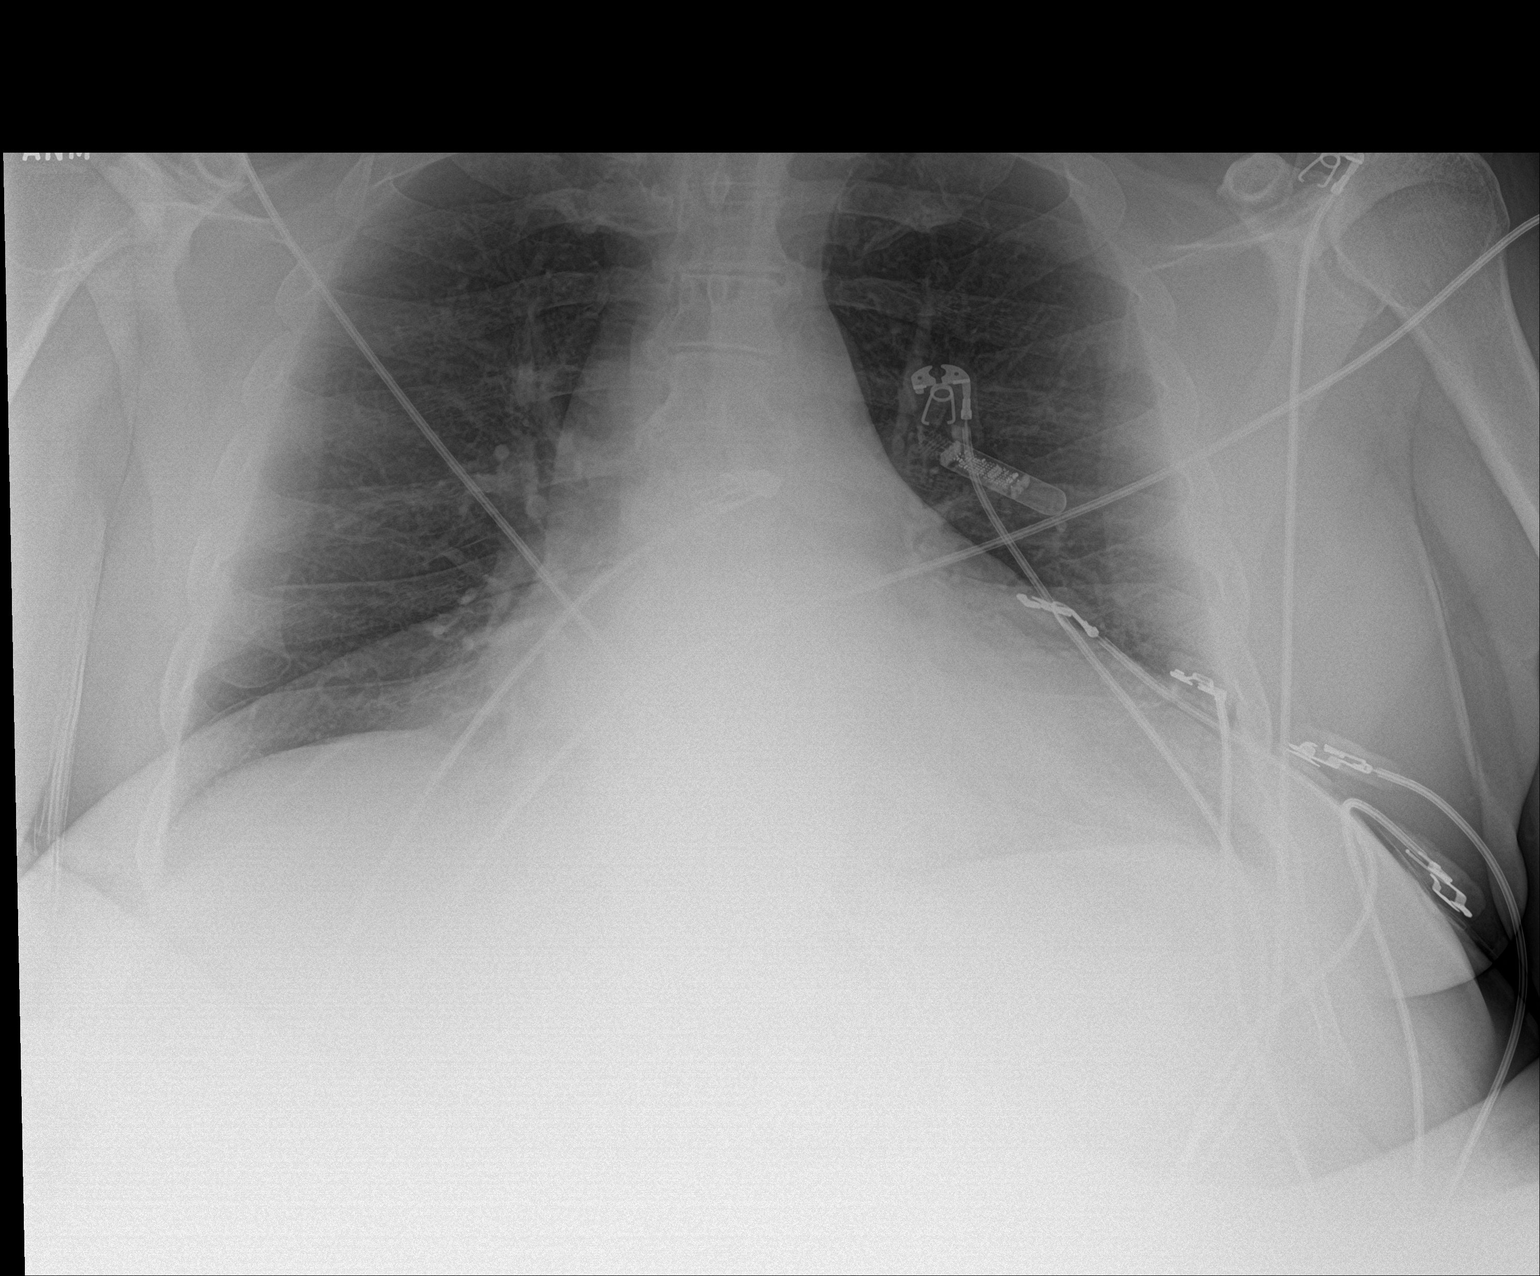

[2 of 2 positions shown; findings below may reference images not displayed]

FINDINGS: The heart size and mediastinal contours are within normal limits.
Low lung volumes are noted. Both lungs are clear. The visualized
skeletal structures are unremarkable.
IMPRESSION: No active cardiopulmonary disease.

## 2022-01-22 IMAGING — MR MR [PERSON_NAME] UP WO/W CM*L*
8 of 10 series · 37 of 48 positions shown · IV contrast (gadavist)
Comparison: None Available.

CLINICAL DATA: Soft tissue mass, upper arm, superficial Brachial
plexitis evaluation

EXAM:
MRI OF UPPER LEFT EXTREMITY WITHOUT AND WITH CONTRAST
TECHNIQUE: Multiplanar, multisequence MR imaging of the left upper
extremity/brachial plexus was performed before and after the
administration of intravenous contrast.
CONTRAST:  10mL GADAVIST GADOBUTROL 1 MMOL/ML IV SOLN

[Series 10: t1_tse_cor_320 · coronal · 3.0mm · 0.75mm/px · 4 of 32 slices shown]
[im 1/32]
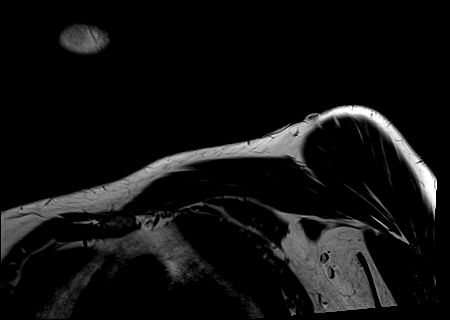
[im 11/32]
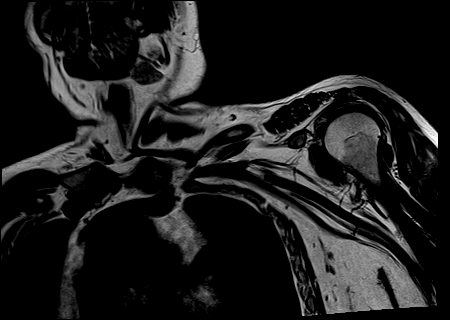
[im 21/32]
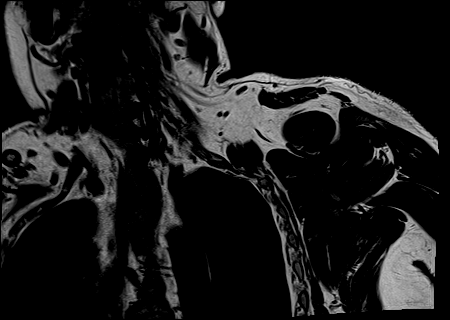
[im 32/32]
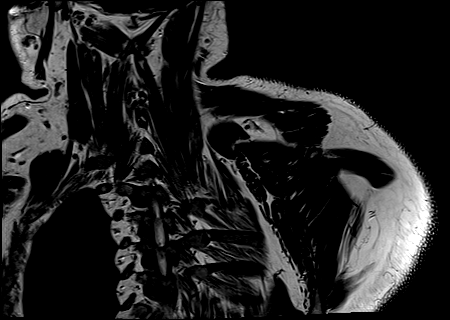

[Series 11: t2_tse_stir_cor · coronal · 3.0mm · 0.75mm/px · 4 of 32 slices shown]
[im 1/32]
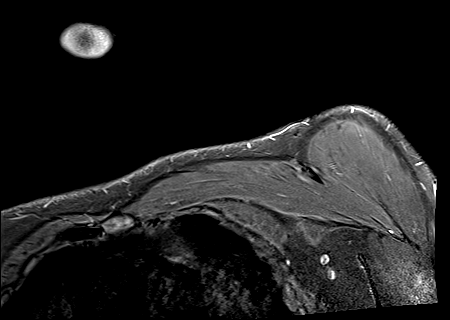
[im 11/32]
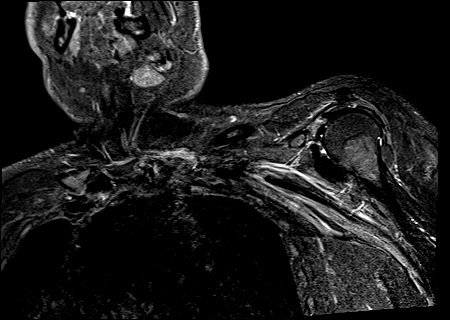
[im 21/32]
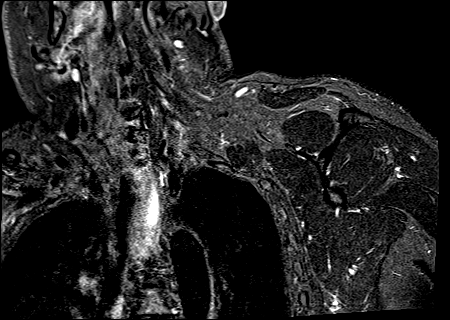
[im 32/32]
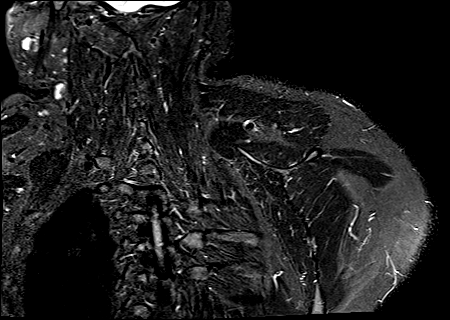

[Series 12: t1_qtse_tra · axial · 3.0mm · 0.59mm/px · z∈[-330,-182]mm · 5 of 40 slices shown]
[im 1/40]
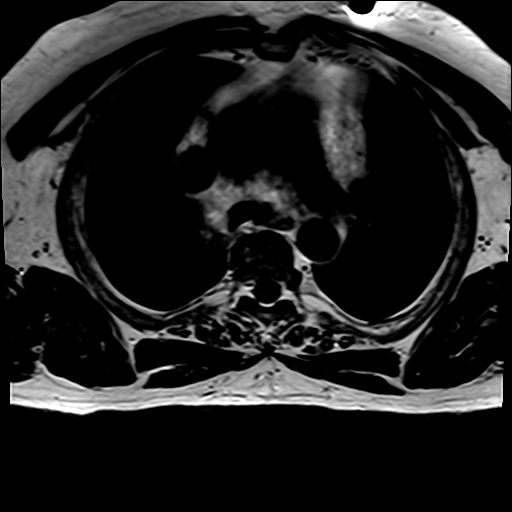
[im 10/40]
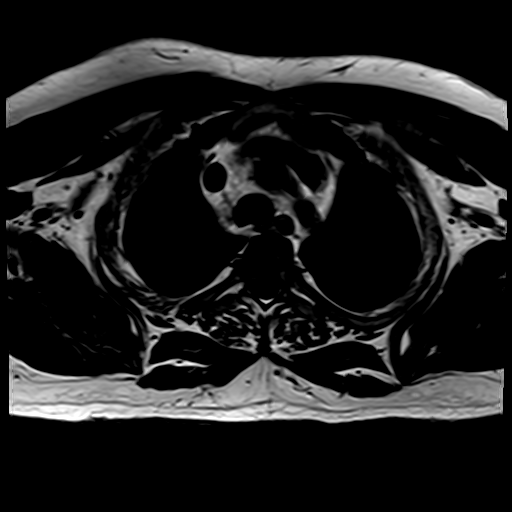
[im 20/40]
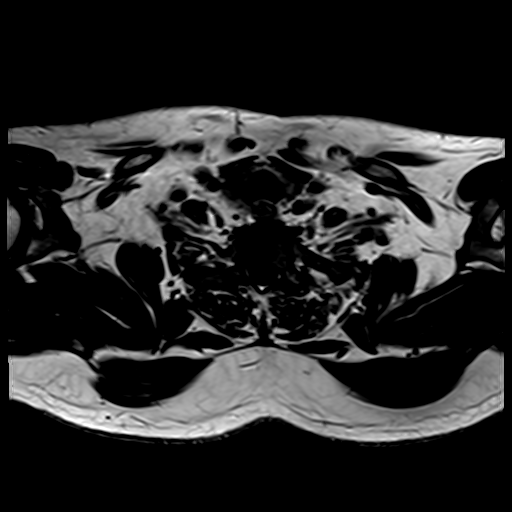
[im 30/40]
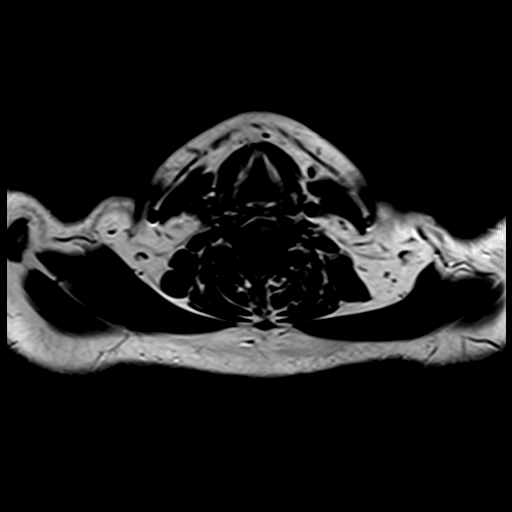
[im 40/40]
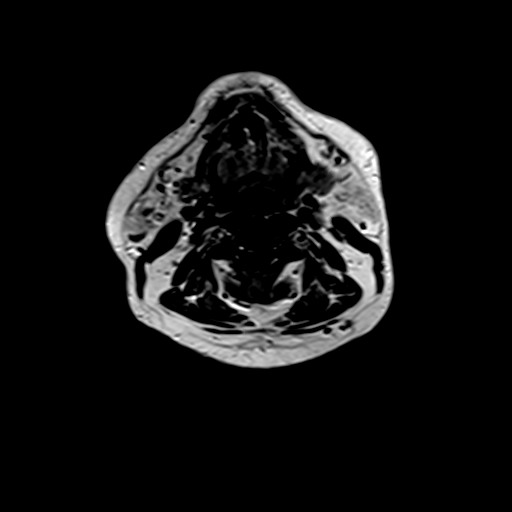

[Series 13: t2_blade_fs_tra · axial · 3.0mm · 1.17mm/px · z∈[-330,-182]mm · 5 of 40 slices shown]
[im 1/40]
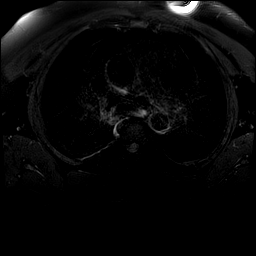
[im 10/40]
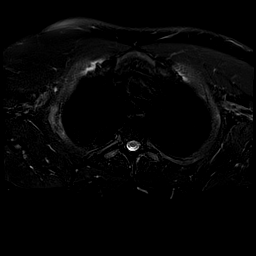
[im 20/40]
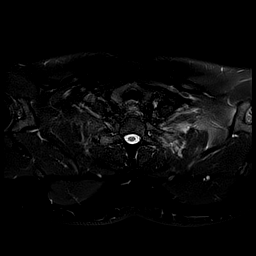
[im 30/40]
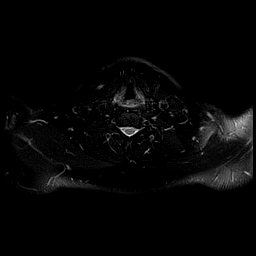
[im 40/40]
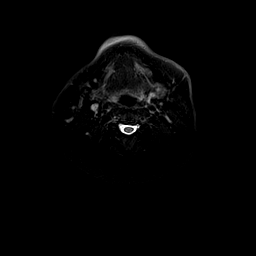

[Series 14: t1_tse_sag_320 · sagittal · 3.0mm · 0.75mm/px · 5 of 42 slices shown]
[im 1/42]
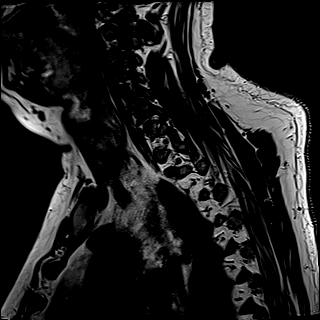
[im 11/42]
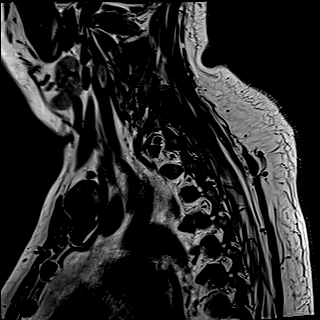
[im 21/42]
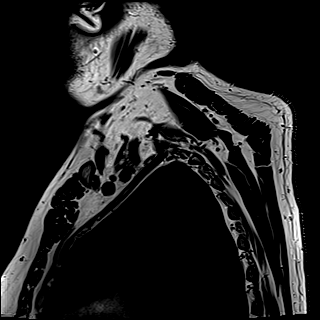
[im 31/42]
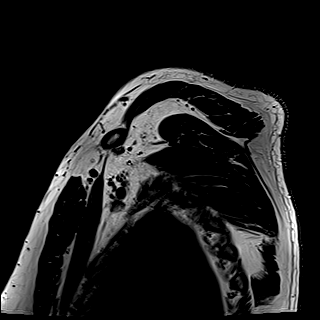
[im 42/42]
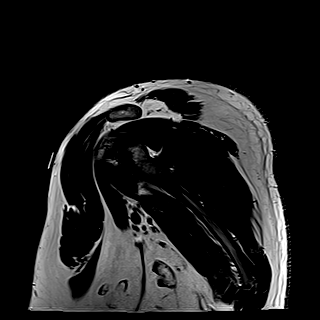

[Series 15: STIR · sagittal · 3.0mm · 0.75mm/px · 6 of 44 slices shown]
[im 1/44]
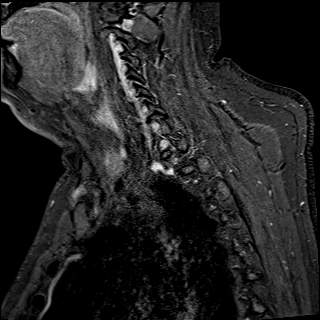
[im 9/44]
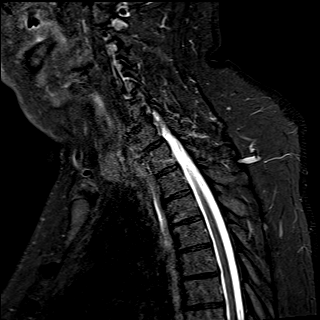
[im 18/44]
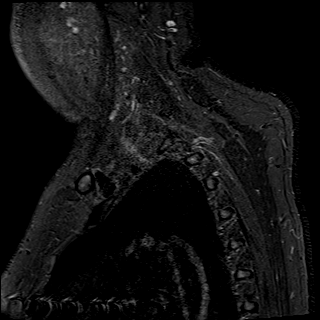
[im 26/44]
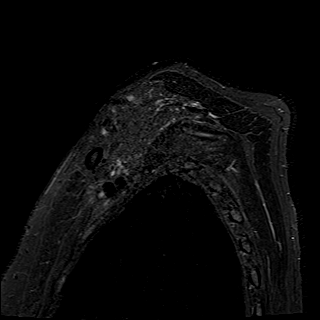
[im 35/44]
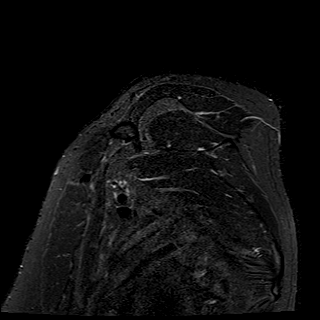
[im 44/44]
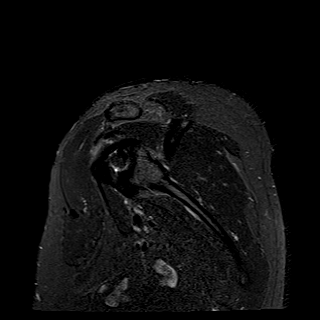

[Series 17: t1_tse_dixon_tra pre_w · axial · 3.0mm · 0.94mm/px · z∈[-324,-177]mm · 5 of 40 slices shown]
[im 1/40]
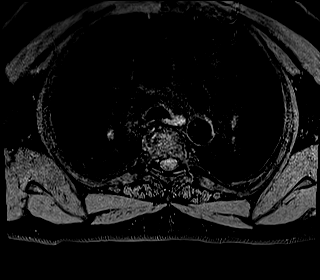
[im 10/40]
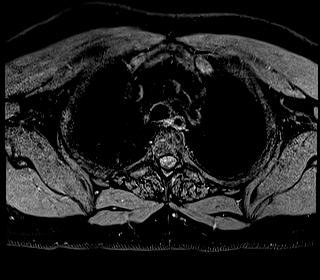
[im 20/40]
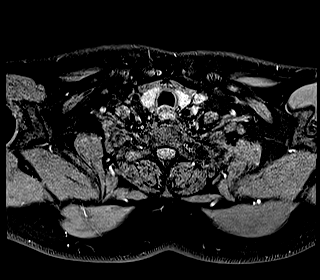
[im 30/40]
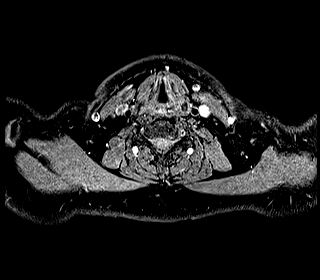
[im 40/40]
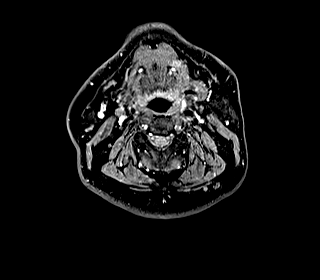

[Series 19: t1_tse_dixon_tra post_w · axial · 3.0mm · 0.94mm/px · z∈[-324,-252]mm · 3 of 40 slices shown]
[im 1/40]
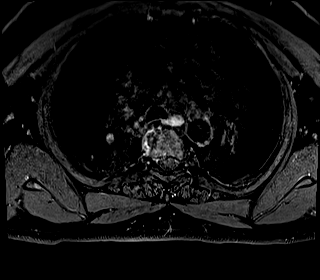
[im 10/40]
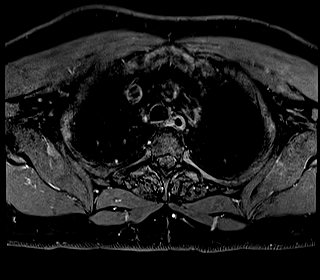
[im 20/40]
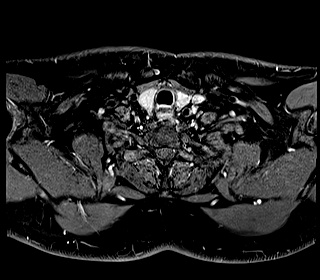

[37 of 48 positions shown; findings below may reference images not displayed]

FINDINGS: Bones/Joint/Cartilage

There are mild multilevel degenerative disc disease and facet
arthropathy in the cervical and upper thoracic spine. See separately
dictated cervical spine MRI. There is no significant stenosis in the
visualized upper thoracic spine. There is no evidence of fracture.
No aggressive osseous lesion. There is mild inflammatory change
corresponding to bridging lateral osteophyte formation at T4-T5.

Muscles and Tendons

No significant muscle edema or muscle atrophy.

Soft tissues

There is a small amount of fluid within the subdeltoid bursa along
the left shoulder. There is mild-to-moderate left AC joint
osteoarthritis. There is no abnormal enhancement. No focal fluid
collection. No cystic or solid mass.

Brachial plexus

Roots: Moderate bilateral neural foraminal stenosis at C5-C6 and
mild left neural foraminal stenosis at C6-C7, see separately
dictated cervical spine MRI. Nerve roots are normal in signal and
caliber.

Trunks: Normal in signal and caliber.

Divisions: Normal in signal in caliber.

Cords: Normal in signal and caliber.

Branches: Normal in signal and caliber.
IMPRESSION: Moderate bilateral neural foraminal stenosis at C5-C6 and mild left
neural foraminal stenosis at C6-C7, see separately dictated cervical
spine MRI. No other findings to suggest brachial plexopathy.

Mild subacromial-subdeltoid bursitis of the left shoulder. Mild to
moderate left AC joint osteoarthritis.

## 2022-01-22 MED ORDER — LORAZEPAM 1 MG PO TABS: 0.0000 mg | ORAL_TABLET | Freq: Two times a day (BID) | ORAL | Status: DC

## 2022-01-22 MED ORDER — FENTANYL CITRATE PF 50 MCG/ML IJ SOSY
50.0000 ug | PREFILLED_SYRINGE | INTRAMUSCULAR | Status: DC | PRN
  Filled 2022-01-22: qty 1

## 2022-01-22 MED ORDER — SODIUM CHLORIDE 0.9 % IV BOLUS: 1000.0000 mL | Freq: Once | INTRAVENOUS | Status: DC

## 2022-01-22 MED ORDER — LORAZEPAM 1 MG PO TABS: 0.0000 mg | ORAL_TABLET | Freq: Four times a day (QID) | ORAL | Status: DC

## 2022-01-22 MED ORDER — SODIUM CHLORIDE 0.9 % IV BOLUS
1000.0000 mL | Freq: Once | INTRAVENOUS | Status: AC
  Administered 2022-01-22: 1000 mL via INTRAVENOUS

## 2022-01-22 MED ORDER — LORAZEPAM 2 MG/ML IJ SOLN: 0.0000 mg | Freq: Two times a day (BID) | INTRAMUSCULAR | Status: DC

## 2022-01-22 MED ORDER — LORAZEPAM 2 MG/ML IJ SOLN: 0.0000 mg | Freq: Four times a day (QID) | INTRAMUSCULAR | Status: DC

## 2022-01-22 MED ORDER — GADOBUTROL 1 MMOL/ML IV SOLN
10.0000 mL | Freq: Once | INTRAVENOUS | Status: AC | PRN
  Administered 2022-01-22: 10 mL via INTRAVENOUS

## 2022-01-22 MED ORDER — IOHEXOL 350 MG/ML SOLN
75.0000 mL | Freq: Once | INTRAVENOUS | Status: AC | PRN
  Administered 2022-01-22: 75 mL via INTRAVENOUS

## 2022-01-22 MED ORDER — THIAMINE HCL 100 MG PO TABS: 100.0000 mg | ORAL_TABLET | Freq: Every day | ORAL | Status: DC

## 2022-01-22 MED ORDER — THIAMINE HCL 100 MG/ML IJ SOLN: 100.0000 mg | Freq: Every day | INTRAMUSCULAR | Status: DC

## 2022-01-22 MED ORDER — SODIUM CHLORIDE 0.9% FLUSH: 3.0000 mL | Freq: Once | INTRAVENOUS | Status: DC

## 2022-01-22 NOTE — ED Notes (Signed)
RN met with pt, pt reporting 9/10 left shoulder pain that began 2 weeks ago. Pt presenting as frustrated, short responses, dismissive gestures towards RN's general questioning regarding symtpoms and drinking history. RN attempted to explain CIWA protocol order recently placed by PA, pt stated "I'm not here for my drinking, I will just leave if yall are going to keep me." RN explained that it is a very common order set placed for patients that have a history of alcohol use. Pt and visitor laughed at RN. MD made aware of patient behavior and intention to leave.

## 2022-01-22 NOTE — Consult Note (Signed)
Neurology Consultation Reason for Consult: Code stroke Requesting Physician: Regan Lemming  CC: Left arm pain, double vision, urinary difficulty  History is obtained from: Patient, wife and chart review  HPI: Samuel Williams is a 51 y.o. male with a past medical history significant for PTSD/bipolar disorder, hypertension, poorly controlled diabetes, sleep apnea, and follows with [facility] for movement disorder (tremor +/- parkinsonism)  He denies drinking daily, but his wife reports that he drinks 12-24 beers daily.  He also chews tobacco and he has been drinking today.  He has been complaining to his wife about left arm pain/numbness that has been going on for about the last 1 week, which he reports to me has been going on for 2 weeks associated with double vision of variable direction and headache.  Additionally reports pain when he urinates and difficulty holding his urine although he denies genital/anal numbness.  He notes he can feel what he needs to go but at the same time he cannot and is unable to clarify this.  However he denies fever, worsening of his headache with position changes, episodes of loss of vision.  He reports the reason he came into the ED today was due to concerns from family  LKW: 1-2 weeks prior to presentation tPA given?: No, out of the window IA performed?: No, exam no c/w LVO  ROS: All other review of systems was negative except as noted in the HPI.   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    Unspecified vitamin D deficiency    Past Surgical History:  Procedure Laterality Date   CARDIAC CATHETERIZATION  2015   non obstructive CAD, normal LV function   CHOLECYSTECTOMY     CHOLECYSTECTOMY     KNEE SURGERY     LEFT HEART CATHETERIZATION WITH CORONARY ANGIOGRAM N/A 10/28/2013   Procedure: LEFT HEART CATHETERIZATION WITH CORONARY ANGIOGRAM;  Surgeon: Peter M Martinique, MD;  Location: [facility] CATH LAB;  Service: Cardiovascular;  Laterality: N/A;   LOOP RECORDER IMPLANT N/A 07/20/2014   Procedure: LOOP RECORDER IMPLANT;  Surgeon: Evans Lance, MD;  Location: [facility] CATH LAB;  Service: Cardiovascular;  Laterality: N/A;   TONSILLECTOMY      Current Facility-Administered Medications:    sodium chloride flush (NS) 0.9 % injection 3 mL, 3 mL, Intravenous, Once, Regan Lemming, MD  Current Outpatient Medications:    albuterol (VENTOLIN HFA) 108 (90 Base) MCG/ACT inhaler, Inhale into the lungs every 6 (six) hours as needed for wheezing or shortness of breath., Disp: , Rfl:    aspirin 81 MG chewable tablet, Chew 1 tablet (81 mg total) by mouth daily., Disp: 30 tablet, Rfl: 0   diltiazem (CARDIZEM CD) 300 MG 24 hr capsule, Take 300 mg by mouth at bedtime., Disp: , Rfl:    EPINEPHrine 0.15 MG/0.15ML IJ injection, Inject 0.15 mg into the muscle once as needed for anaphylaxis (allergic to  bees and mushrooms)., Disp: , Rfl:    hydrOXYzine (ATARAX/VISTARIL) 25 MG tablet, Take 1 tablet (25 mg total) by mouth every 6 (six) hours as needed (anxiety/agitation or CIWA < or = 10). (Patient taking differently: Take 25 mg by mouth every 6 (six) hours as needed for anxiety.), Disp: 30 tablet, Rfl: 0   ibuprofen (ADVIL) 200 MG tablet, Take 400 mg by mouth every 6 (six) hours as needed (pain)., Disp: , Rfl:    insulin glargine (LANTUS) 100 unit/mL SOPN, Inject 12 Units into the skin at bedtime., Disp: , Rfl:    insulin lispro  (HUMALOG KWIKPEN) 100 UNIT/ML KwikPen, Inject 4-8 Units into the skin See admin instructions. Inject 4-8 units subcutaneously twice daily as needed for CBG >150 (per sliding scale based on sugars), Disp: , Rfl:    losartan (COZAAR) 100 MG tablet, Take 100 mg by mouth daily. (Patient not taking: Reported on 08/15/2021), Disp: , Rfl:    metoprolol succinate (TOPROL-XL) 50 MG 24 hr tablet, Take 50 mg by mouth every morning., Disp: , Rfl:    Multiple Vitamin (MULTIVITAMIN WITH MINERALS) TABS tablet, Take 1 tablet by mouth daily., Disp: , Rfl:    nitroGLYCERIN (NITROSTAT) 0.4 MG SL tablet, Place 0.4 mg under the tongue every 5 (five) minutes as needed for chest pain., Disp: , Rfl:    primidone (MYSOLINE) 50 MG tablet, Take 1 tablet (50 mg total) by mouth at bedtime. For essential tremor (Patient taking differently: Take 100 mg by mouth in the morning and at bedtime. For essential tremor), Disp: 5 tablet, Rfl: 0   Family History  Problem Relation Age of Onset   Hypertension Father    Heart disease Father    Hypercholesterolemia Father    Thyroid disease Father    Stroke Father    Cancer Father    Hypertension Paternal Grandfather    Heart disease Paternal Grandfather    Hypercholesterolemia Paternal Grandfather    Stroke Paternal Grandfather    Colon cancer Paternal Grandfather    Diabetes Paternal Grandfather    Hypertension Sister    Hypercholesterolemia Sister    Heart disease Maternal Grandfather    Diabetes Maternal Grandfather    Lung cancer Maternal Grandmother    Diabetes Maternal Grandmother    Diabetes Paternal Grandmother    Arthritis Other    Brain cancer Mother    Social History:  reports that he has quit smoking. His smokeless tobacco use includes chew. He reports current alcohol use. He reports that he does not use drugs.   Exam: Current vital signs: Wt 101.4 kg   BMI 33.99 kg/m  Vital signs in last 24 hours: Weight:  [101.4 kg] 101.4 kg (06/11 1200)   Physical Exam   Constitutional: Appears well-developed and well-nourished.  Psych: Affect overall calm and cooperative, intermittently frustrated Eyes: No scleral injection HENT: No oropharyngeal obstruction.  MSK: no joint deformities.  Cardiovascular: Normal rate and regular rhythm. Perfusing extremities well Respiratory: Effort normal, non-labored breathing GI: Soft.  No distension. There is no tenderness.   Neuro: Mental Status: Patient is awake, alert, oriented to person, place, age and situation, although he reports that his may Patient is able to give a clear and coherent history.  Initially he speaks very slowly in the head CT scanner, but later when demanding explanations for his longstanding symptoms he is able to speak much more fluently No signs of aphasia or neglect, other than incorrectly repeating "no ifs, ands or buts about it" Cranial Nerves: II:  Visual Fields are full. Pupils are equal, round, and reactive to light.  III,IV, VI: EOMI without ptosis or diploplia.  Initially he reports pain with eye movement when tracking my finger, but when orienting to my face when I am speaking to him he is able to look bilaterally without any distress V: Facial sensation is subjectively reduced on the left VII: Facial movement is symmetric.  VIII: hearing is intact to voice X: Uvula incompletely visualized XI: Shoulder shrug is symmetric. XII: tongue is midline without atrophy or fasciculations.  Motor/gait: Tone is normal. Bulk is normal.  He is nearly 5/5 throughout although he has intermittent giveaway weakness most prominent in the left deltoid and left hip flexion.  Initially he reports he cannot move his leg at all antigravity while he is laying in the [place].  Subsequently he is able to stand to urinate and uses both legs equally on both arms equally as he is maneuvering Sensory: Reports reduced sensation on the left arm and leg, initially reports he cannot feel on the left leg at all but  is reactive to touch on the leg and casual observation Deep Tendon Reflexes: 1+ and symmetric in the brachioradialis, absent at the patellae Plantars: Toes are mute bilaterally Cerebellar: FNF with a coarse but variable tremor bilaterally, right worse than left Gait:  Steady stance for urinating in a urinal  NIHSS total 12 Score breakdown: One-point for not answering month correctly, 3 points for left leg weakness (later resolved to at least 1), one-point for right leg weakness, one-point for right arm weakness, one-point for right leg weakness, 2 points for bilateral upper extremity ataxia, one-point for sensory loss on the left, one-point for mild aphasia, one-point for mild dysarthria  Performed at time of patient arrival to ED    I have reviewed labs in epic and the results pertinent to this consultation are:  Basic Metabolic Panel: Recent Labs  Lab 01/22/22 1219 01/22/22 1230  NA 132* 133*  K 4.6 4.2  CL 97* 99  CO2 20*  --   GLUCOSE 376* 386*  BUN 10 11  CREATININE 0.79 0.90  CALCIUM 9.2  --     CBC: Recent Labs  Lab 01/22/22 1219 01/22/22 1230  WBC 9.7  --   NEUTROABS 5.0  --   HGB 17.5* 17.0  HCT 48.1 50.0  MCV 92.1  --   PLT 245  --     Coagulation Studies: Recent Labs    01/22/22 1219  LABPROT 12.9  INR 1.0      I have reviewed the images obtained:  CT head  Unremarkable CT appearance of the brain. ASPECTS of 10.   Impression: This is a 51 year old gentleman with a past medical history as above presenting with an examination that has a significant amount of functional overlay.  In this setting, it is possible to miss subtle pathology and therefore I recommend an MRI brain and cervical spine to rule out any acute process that would require inpatient intervention.  Given the description of severe pain in the arm initially, could also consider [place]-Turner syndrome  Recommendations: -ESR -MRI brachial plexus, cervical spine and brain -If  the studies are reassuring, continue outpatient follow-up with his neurologist  Lesleigh Noe MD-PhD [facility] [phone] Available 7 AM to 7 PM, outside these hours please contact Neurologist on call listed on [place] care time was exclusive of separately billable procedures and treating other patients.   Critical care was necessary  to treat or prevent imminent or life-threatening deterioration, initially patient was presented as a code stroke requiring emergent evaluation for possible thrombolytic or intra-arterial thrombectomy intervention.  He was determined not to be a candidate based on emergent review of his history.   Critical care was time spent personally by me on the following activities: development of treatment plan with patient and/or surrogate as well as nursing, discussions with consultants/primary team, evaluation of patient's response to treatment, examination of patient, obtaining history from patient or surrogate, ordering and performing treatments and interventions, ordering and review of laboratory studies, ordering and review of radiographic studies, and re-evaluation of patient's condition as needed, as documented above.

## 2022-01-22 NOTE — ED Triage Notes (Signed)
Pt states that he has been feeling "off" for a couple weeks, started with left arm pain and progressed to weakness. Also states his speech is off. War vet and has severe PTSD, and only wants male providers.  Also c/o headache  Ems states that wife stated that he seemed fine last night and the pt was the one that called 911 this morning.

## 2022-01-22 NOTE — ED Notes (Signed)
Pt repeating intention to leave. AMA AVS paperwork printed and provided to patient. AMA signature provided. Pt made aware of risks of leaving before completed evaluation of symptoms. Pt continues to endorse plan to leave and left facility.

## 2022-01-22 NOTE — ED Provider Notes (Signed)
51yo male with left arm pain and paraesthesia x 2 weeks, arrived as code stroke, seen by neuro who has ordered MRI brain/c-spine, consider MS vs brachial plexus   Has OP neuro.  Physical Exam  BP (!) 156/102   Pulse (!) 105   Resp 16   Wt 101.4 kg   SpO2 98%   BMI 33.99 kg/m   Physical Exam  Procedures  Procedures  ED Course / MDM   Clinical Course as of 01/22/22 2000  Sun Jan 22, 2022  1439 CT venogram, CT angio head and neck and CT head without contrast all unremarkable without any acute findings.  Neurology Dr. Curly Shores has already evaluated patient and ordered additional imaging including MRI brachial plexus, MRI brain with and without and MRI cervical spine with and without contrast. [WF]    Clinical Course User Index [WF] Tedd Sias, PA   Medical Decision Making Amount and/or Complexity of Data Reviewed Labs: ordered. Radiology: ordered.  Risk OTC drugs. Prescription drug management.   MRI brain and C-Spine resulted, brachial plexus pending. Noted patient's BP and HR elevated, etoh 198 on arrival. CIWA protocol initiated. Informed by patient's nurse, pt would like to leave AMA. Discussed available results with patient who is reassured with negative brain MRI and would like to leave. Plans to follow up with his ortho and neuro. Recommend return to the ER at any time for further eval.   Glucose elevated to 376 on CMP, given IVF, not in DKA. Elevated LFTs likely secondary to alcohol use.        Tacy Learn, PA-C 01/22/22 Malachi Carl, MD 01/23/22 [phone]

## 2022-01-22 NOTE — Discharge Instructions (Signed)
As discussed, your work up today is not complete. Your brachial plexus MRI is pending. Recommend follow up with your neurologist and primary care provider. Return to the ER for worsening or concerning symptoms.

## 2022-01-22 NOTE — ED Notes (Signed)
Back for MRI.

## 2022-01-22 NOTE — ED Notes (Signed)
Pt gone to MRI 

## 2022-01-22 NOTE — ED Provider Notes (Signed)
[facility] EMERGENCY DEPARTMENT Provider Note   CSN: [id number] Arrival date & time: 01/22/22  1216     History  Chief Complaint  Patient presents with   Code Stroke    Samuel Williams is a 51 y.o. male.  HPI  Patient is a 51 year old male with past medical history significant for PTSD/bipolar, HTN, DM 2, sleep apnea, tremor thought to be essential tremor.   Patient is presented emergency room today with complaint of 2 weeks of left arm pain/numbness.  He is seemingly experiencing continued symptoms of pain and burning in his entire left arm today.  He does endorse some occasional episodes of double vision. No injuries to arm no falls.   He denies any back or neck pain.  Denies any chest pain or difficulty breathing.  No fevers.      Home Medications Prior to Admission medications   Medication Sig Start Date End Date Taking? Authorizing Provider  albuterol (VENTOLIN HFA) 108 (90 Base) MCG/ACT inhaler Inhale into the lungs every 6 (six) hours as needed for wheezing or shortness of breath.   Yes [provider]  ibuprofen (ADVIL) 200 MG tablet Take 400 mg by mouth every 6 (six) hours as needed (pain).   Yes [provider]  insulin glargine (LANTUS) 100 unit/mL SOPN Inject 12 Units into the skin at bedtime.   Yes [provider]  insulin lispro (HUMALOG KWIKPEN) 100 UNIT/ML KwikPen Inject 4-18 Units into the skin See admin instructions. Inject 4-18 units subcutaneously twice daily as needed for CBG >150 (per sliding scale based on sugars)   Yes [provider]  metoprolol succinate (TOPROL-XL) 50 MG 24 hr tablet Take 50 mg by mouth every morning. 04/29/20  Yes [provider]  nitroGLYCERIN (NITROSTAT) 0.4 MG SL tablet Place 0.4 mg under the tongue every 5 (five) minutes as needed for chest pain.   Yes [provider]  primidone (MYSOLINE) 50 MG tablet Take 1 tablet (50 mg total) by mouth at bedtime. For  essential tremor Patient taking differently: Take 100 mg by mouth 2 (two) times daily as needed (tremors). 08/13/18  Yes Connye Burkitt, NP  aspirin 81 MG chewable tablet Chew 1 tablet (81 mg total) by mouth daily. 08/15/21   Jeanell Sparrow, DO  EPINEPHrine 0.15 MG/0.15ML IJ injection Inject 0.15 mg into the muscle once as needed for anaphylaxis (allergic to bees and mushrooms).    [provider]  hydrOXYzine (ATARAX/VISTARIL) 25 MG tablet Take 1 tablet (25 mg total) by mouth every 6 (six) hours as needed (anxiety/agitation or CIWA < or = 10). Patient taking differently: Take 25 mg by mouth every 6 (six) hours as needed for anxiety. 08/13/18   Connye Burkitt, NP      Allergies    Bee venom, Mushroom extract complex, Ambien [zolpidem], and Protonix [pantoprazole sodium]    Review of Systems   Review of Systems  Physical Exam Updated Vital Signs BP 133/86   Pulse 94   Resp 16   Wt 101.4 kg   SpO2 96%   BMI 33.99 kg/m  Physical Exam Vitals and nursing note reviewed.  Constitutional:      General: He is not in acute distress. HENT:     Head: Normocephalic and atraumatic.     Nose: Nose normal.  Eyes:     General: No scleral icterus. Cardiovascular:     Rate and Rhythm: Normal rate and regular rhythm.     Pulses: Normal  pulses.     Heart sounds: Normal heart sounds.  Pulmonary:     Effort: Pulmonary effort is normal. No respiratory distress.     Breath sounds: No wheezing.  Abdominal:     Palpations: Abdomen is soft.     Tenderness: There is no abdominal tenderness.  Musculoskeletal:     Cervical back: Normal range of motion.     Right lower leg: No edema.     Left lower leg: No edema.  Skin:    General: Skin is warm and dry.     Capillary Refill: Capillary refill takes less than 2 seconds.  Neurological:     Mental Status: He is alert. Mental status is at baseline.     Comments: Grossly normal neuro exam   Alert and oriented to self, place, time and event.    Speech is fluent, clear without dysarthria or dysphasia.   Strength 5/5 in upper/lower extremities   Sensation intact in upper/lower extremities although he reports it feels different in his LUE  CN I not tested  CN II grossly intact visual fields bilaterally. Did not visualize posterior eye.  CN III, IV, VI PERRLA and EOMs intact bilaterally  CN V Intact sensation to sharp and light touch to the face  CN VII facial movements symmetric  CN VIII not tested  CN IX, X no uvula deviation, symmetric rise of soft palate  CN XI 5/5 SCM and trapezius strength bilaterally  CN XII Midline tongue protrusion, symmetric L/R movements     Psychiatric:        Mood and Affect: Mood normal.        Behavior: Behavior normal.     ED Results / Procedures / Treatments   Labs (all labs ordered are listed, but only abnormal results are displayed) Labs Reviewed  CBC - Abnormal; Notable for the following components:      Result Value   Hemoglobin 17.5 (*)    MCHC 36.4 (*)    All other components within normal limits  COMPREHENSIVE METABOLIC PANEL - Abnormal; Notable for the following components:   Sodium 132 (*)    Chloride 97 (*)    CO2 20 (*)    Glucose, Bld 376 (*)    AST 44 (*)    ALT 48 (*)    Total Bilirubin 1.3 (*)    All other components within normal limits  ETHANOL - Abnormal; Notable for the following components:   Alcohol, Ethyl (B) 198 (*)    All other components within normal limits  I-STAT CHEM 8, ED - Abnormal; Notable for the following components:   Sodium 133 (*)    Glucose, Bld 386 (*)    Calcium, Ion 1.06 (*)    TCO2 20 (*)    All other components within normal limits  CBG MONITORING, ED - Abnormal; Notable for the following components:   Glucose-Capillary 391 (*)    All other components within normal limits  PROTIME-INR  APTT  DIFFERENTIAL  SEDIMENTATION RATE  VITAMIN B1  VITAMIN B12  METHYLMALONIC ACID, SERUM  TROPONIN I (HIGH SENSITIVITY)    EKG EKG  Interpretation  Date/Time:  Sunday January 22 2022 13:18:43 EDT Ventricular Rate:  97 PR Interval:  169 QRS Duration: 83 QT Interval:  356 QTC Calculation: 453 R Axis:   21 Text Interpretation: Sinus rhythm Anteroseptal infarct, old Confirmed by Regan Lemming ([id number]) on 01/22/2022 2:38:56 PM  Radiology CT VENOGRAM HEAD  Result Date: 01/22/2022 CLINICAL DATA:  Dural venous sinus  thrombosis suspected. Left-sided weakness, speech disturbance, and headache. EXAM: CT VENOGRAM HEAD TECHNIQUE: Venographic phase images of the brain were obtained following the administration of intravenous contrast. Multiplanar reformats and maximum intensity projections were generated. RADIATION DOSE REDUCTION: This exam was performed according to the departmental dose-optimization program which includes automated exposure control, adjustment of the mA and/or kV according to patient size and/or use of iterative reconstruction technique. CONTRAST:  66mL OMNIPAQUE IOHEXOL 350 MG/ML SOLN COMPARISON:  None Available. FINDINGS: The superior sagittal sinus, internal cerebral veins, vein of Galen, straight sinus, transverse sinuses, sigmoid sinuses, and jugular bulbs are patent without evidence of thrombus or significant stenosis. IMPRESSION: Negative CT venogram. Electronically Signed   By: Sebastian Ache M.D.   On: 01/22/2022 13:30   CT ANGIO HEAD NECK W WO CM  Result Date: 01/22/2022 CLINICAL DATA:  Neuro deficit, acute, stroke suspected. Left-sided weakness, speech disturbance, and headache. EXAM: CT ANGIOGRAPHY HEAD AND NECK TECHNIQUE: Multidetector CT imaging of the head and neck was performed using the standard protocol during bolus administration of intravenous contrast. Multiplanar CT image reconstructions and MIPs were obtained to evaluate the vascular anatomy. Carotid stenosis measurements (when applicable) are obtained utilizing NASCET criteria, using the distal internal carotid diameter as the denominator. RADIATION DOSE  REDUCTION: This exam was performed according to the departmental dose-optimization program which includes automated exposure control, adjustment of the mA and/or kV according to patient size and/or use of iterative reconstruction technique. CONTRAST:  68mL OMNIPAQUE IOHEXOL 350 MG/ML SOLN COMPARISON:  None Available. FINDINGS: CTA NECK FINDINGS Aortic arch: Standard 3 vessel aortic arch with minimal atherosclerotic plaque. Patent brachiocephalic and subclavian arteries with a small to moderate amount of soft plaque in the proximal left subclavian artery not resulting in significant stenosis. Right carotid system: Patent without evidence of stenosis, dissection, or significant atherosclerosis. Left carotid system: Patent without evidence of stenosis, dissection, or significant atherosclerosis. Tortuous distal cervical ICA. Vertebral arteries: Patent without evidence of stenosis, dissection, or significant atherosclerosis. Strongly dominant left vertebral artery. Skeleton: Moderate cervical spondylosis. Other neck: No evidence of cervical lymphadenopathy or mass. Upper chest: Clear lung apices. Review of the MIP images confirms the above findings CTA HEAD FINDINGS Anterior circulation: The internal carotid arteries are widely patent from skull base to carotid termini. ACAs and MCAs are patent without evidence of a proximal branch occlusion or significant proximal stenosis. No aneurysm is identified. Posterior circulation: The intracranial vertebral arteries are patent to the basilar patent PICA and SCA origins are seen bilaterally. The basilar artery is widely patent with a suspected incidental fenestration proximally. There are small right and large left posterior communicating arteries with absence of the left P1 segment. Both PCAs are patent without evidence of a significant proximal stenosis. No aneurysm is identified. Venous sinuses: Patent. Anatomic variants: Fetal left PCA. Review of the MIP images confirms the  above findings IMPRESSION: 1. No large vessel occlusion or significant stenosis in the head and neck. 2. Aortic Atherosclerosis (ICD10-I70.0). Electronically Signed   By: Sebastian Ache M.D.   On: 01/22/2022 13:28   CT HEAD CODE STROKE WO CONTRAST  Result Date: 01/22/2022 CLINICAL DATA:  Code stroke. Neuro deficit, acute, stroke suspected. Left-sided weakness, numbness, and speech disturbance. EXAM: CT HEAD WITHOUT CONTRAST TECHNIQUE: Contiguous axial images were obtained from the base of the skull through the vertex without intravenous contrast. RADIATION DOSE REDUCTION: This exam was performed according to the departmental dose-optimization program which includes automated exposure control, adjustment of the mA and/or kV according to  patient size and/or use of iterative reconstruction technique. COMPARISON:  Head CT 03/28/2019 and MRI 07/19/2014 FINDINGS: Brain: There is no evidence of an acute infarct, intracranial hemorrhage, mass, midline shift, or extra-axial fluid collection. The ventricles and sulci are within normal limits. Vascular: No hyperdense vessel. Skull: No acute fracture or suspicious osseous lesion. Sinuses/Orbits: Prior left-sided sinus surgery. No evidence of acute inflammatory sinus disease. Trace left mastoid fluid. Unremarkable orbits. Other: None. ASPECTS [facility] Stroke Program Early CT Score) - Ganglionic level infarction (caudate, lentiform nuclei, internal capsule, insula, M1-M3 cortex): 7 - Supraganglionic infarction (M4-M6 cortex): 3 Total score (0-10 with 10 being normal): 10 IMPRESSION: Unremarkable CT appearance of the brain. ASPECTS of 10. These results were communicated to Dr. Curly Shores at 12:41 pm on 01/22/2022 by text page via the [facility] messaging system. Electronically Signed   By: Logan Bores M.D.   On: 01/22/2022 12:41    Procedures Procedures    Medications Ordered in ED Medications  sodium chloride flush (NS) 0.9 % injection 3 mL (has no administration in time range)   fentaNYL (SUBLIMAZE) injection 50 mcg (has no administration in time range)  sodium chloride 0.9 % bolus 1,000 mL (has no administration in time range)  iohexol (OMNIPAQUE) 350 MG/ML injection 75 mL (75 mLs Intravenous Contrast Given 01/22/22 1308)    ED Course/ Medical Decision Making/ A&P Clinical Course as of 01/22/22 1548  Sun Jan 22, 2022  1439 CT venogram, CT angio head and neck and CT head without contrast all unremarkable without any acute findings.  Neurology Dr. Curly Shores has already evaluated patient and ordered additional imaging including MRI brachial plexus, MRI brain with and without and MRI cervical spine with and without contrast. [WF]    Clinical Course User Index [WF] Tedd Sias, PA                           Medical Decision Making Amount and/or Complexity of Data Reviewed Labs: ordered. Radiology: ordered.  Risk Prescription drug management.   This patient presents to the ED for concern of left shoulder and arm pain and numbness, this involves a number of treatment options, and is a complaint that carries with it a moderate to high risk of complications and morbidity.  The differential diagnosis includes The differential diagnosis of weakness includes but is not limited to neurologic causes (GBS, myasthenia gravis, CVA, MS, ALS, transverse myelitis, spinal cord injury, CVA, botulism, ) and other causes: ACS, Arrhythmia, syncope, orthostatic hypotension, sepsis, hypoglycemia, electrolyte disturbance, hypothyroidism, respiratory failure, symptomatic anemia, dehydration, heat injury, polypharmacy, malignancy.    Co morbidities: Discussed in HPI   Brief History:  Patient is a 51 year old male with past medical history significant for PTSD/bipolar, HTN, DM 2, sleep apnea, tremor thought to be essential tremor.   Patient is presented emergency room today with complaint of 2 weeks of left arm pain/numbness.  He is seemingly experiencing continued symptoms of  pain and burning in his entire left arm today.  He does endorse some occasional episodes of double vision. No injuries to arm no falls.   He denies any back or neck pain.  Denies any chest pain or difficulty breathing.  No fevers.    EMR reviewed including pt PMHx, past surgical history and past visits to ER.   See HPI for more details   Lab Tests:   I ordered and independently interpreted labs. Labs notable for mild hyponatremia in the setting of elevated blood sugar.  Mild transaminitis 44, 48 AST and ALT, coags within normal limits.  CBC with elevation normal differential.   Imaging Studies:  NAD. I personally reviewed all imaging studies and no acute abnormality found. I agree with radiology interpretation.  See clinical course box  Cardiac Monitoring:  The patient was maintained on a cardiac monitor.  I personally viewed and interpreted the cardiac monitored which showed an underlying rhythm of: NSR EKG non-ischemic   Medicines ordered:  I ordered medication including fent for paiun and  for 1L NS over 2 hours for hydration.  Reevaluation of the patient after these medicines showed that the patient  I have reviewed the patients home medicines and have made adjustments as needed   Critical Interventions:     Consults/Attending Physician   I discussed this case with my attending physician who cosigned this note including patient's presenting symptoms, physical exam, and planned diagnostics and interventions. Attending physician stated agreement with plan or made changes to plan which were implemented.   Reevaluation:  After the interventions noted above I re-evaluated patient and found that they have   Social Determinants of Health:      Problem List / ED Course:  LUE pain/weakness. Neurology has seen and made recommendations. Dr. B of neurology has reviewed head imaging. No back pain making SEA or hemorrhage or compression/cauda equina unlikely. MRIs  ordered of Cspine, brain and brachial plexus.  PA murphy to follow up on these. Anticipate DC w neurology FU.    Dispostion:   Final Clinical Impression(s) / ED Diagnoses Final diagnoses:  None    Rx / DC Orders ED Discharge Orders     None         Tedd Sias, [place] 01/22/22 1548    Regan Lemming, MD 01/22/22 1556

## 2022-01-25 LAB — METHYLMALONIC ACID, SERUM: Methylmalonic Acid, Quantitative: 124 nmol/L (ref 0–378)

## 2023-05-22 ENCOUNTER — Other Ambulatory Visit: Payer: Self-pay | Admitting: Nephrology

## 2023-05-22 DIAGNOSIS — E1165 Type 2 diabetes mellitus with hyperglycemia: Secondary | ICD-10-CM

## 2023-05-22 DIAGNOSIS — I10 Essential (primary) hypertension: Secondary | ICD-10-CM

## 2023-05-22 DIAGNOSIS — I2510 Atherosclerotic heart disease of native coronary artery without angina pectoris: Secondary | ICD-10-CM

## 2023-05-22 DIAGNOSIS — I619 Nontraumatic intracerebral hemorrhage, unspecified: Secondary | ICD-10-CM

## 2023-06-01 ENCOUNTER — Other Ambulatory Visit: Payer: Medicaid Other

## 2023-06-05 ENCOUNTER — Ambulatory Visit
Admission: RE | Admit: 2023-06-05 | Discharge: 2023-06-05 | Disposition: A | Discharge status: Home / Self Care | Payer: Medicaid Other | Source: Ambulatory Visit | Attending: Nephrology

## 2023-06-05 DIAGNOSIS — I619 Nontraumatic intracerebral hemorrhage, unspecified: Secondary | ICD-10-CM

## 2023-06-05 DIAGNOSIS — I2510 Atherosclerotic heart disease of native coronary artery without angina pectoris: Secondary | ICD-10-CM

## 2023-06-05 DIAGNOSIS — E1165 Type 2 diabetes mellitus with hyperglycemia: Secondary | ICD-10-CM

## 2023-06-05 DIAGNOSIS — I10 Essential (primary) hypertension: Secondary | ICD-10-CM

## 2023-10-19 ENCOUNTER — Ambulatory Visit (HOSPITAL_COMMUNITY): Admission: EM | Admit: 2023-10-19 | Discharge: 2023-10-19 | Disposition: A | Discharge status: Home / Self Care

## 2023-10-19 NOTE — ED Notes (Signed)
 Pt left AMA
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
## 2023-11-09 ENCOUNTER — Ambulatory Visit (HOSPITAL_COMMUNITY): Admission: EM | Admit: 2023-11-09 | Discharge: 2023-11-09 | Discharge status: Against Medical Advice

## 2023-11-09 NOTE — ED Notes (Signed)
 Patient left AMA.
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
## 2023-12-16 ENCOUNTER — Other Ambulatory Visit (HOSPITAL_COMMUNITY)
Admission: EM | Admit: 2023-12-16 | Discharge: 2023-12-18 | Disposition: A | Discharge status: Home / Self Care | Attending: Psychiatry | Admitting: Psychiatry

## 2023-12-16 DIAGNOSIS — F1010 Alcohol abuse, uncomplicated: Secondary | ICD-10-CM | POA: Insufficient documentation

## 2023-12-16 DIAGNOSIS — F319 Bipolar disorder, unspecified: Secondary | ICD-10-CM | POA: Diagnosis present

## 2023-12-16 DIAGNOSIS — F4310 Post-traumatic stress disorder, unspecified: Secondary | ICD-10-CM | POA: Diagnosis not present

## 2023-12-16 DIAGNOSIS — F332 Major depressive disorder, recurrent severe without psychotic features: Secondary | ICD-10-CM | POA: Diagnosis present

## 2023-12-16 DIAGNOSIS — F3130 Bipolar disorder, current episode depressed, mild or moderate severity, unspecified: Secondary | ICD-10-CM | POA: Insufficient documentation

## 2023-12-16 DIAGNOSIS — E1110 Type 2 diabetes mellitus with ketoacidosis without coma: Secondary | ICD-10-CM | POA: Insufficient documentation

## 2023-12-16 LAB — COMPREHENSIVE METABOLIC PANEL WITH GFR
ALT: 38 U/L (ref 0–44)
AST: 36 U/L (ref 15–41)
Albumin: 3.6 g/dL (ref 3.5–5.0)
Alkaline Phosphatase: 92 U/L (ref 38–126)
Anion gap: 10 (ref 5–15)
BUN: 13 mg/dL (ref 6–20)
CO2: 25 mmol/L (ref 22–32)
Calcium: 9.9 mg/dL (ref 8.9–10.3)
Chloride: 100 mmol/L (ref 98–111)
Creatinine, Ser: 0.82 mg/dL (ref 0.61–1.24)
GFR, Estimated: >60 mL/min (ref >60)
Glucose, Bld: 250 mg/dL — ABNORMAL HIGH (ref 70–99)
Potassium: 4 mmol/L (ref 3.5–5.1)
Sodium: 135 mmol/L (ref 135–145)
Total Bilirubin: 1.1 mg/dL (ref 0.0–1.2)
Total Protein: 6.3 g/dL — ABNORMAL LOW (ref 6.5–8.1)

## 2023-12-16 LAB — CBC WITH DIFFERENTIAL/PLATELET
Abs Immature Granulocytes: 0.03 10*3/uL (ref 0.00–0.07)
Basophils Absolute: 0.1 10*3/uL (ref 0.0–0.1)
Basophils Relative: 1 %
Eosinophils Absolute: 0.3 10*3/uL (ref 0.0–0.5)
Eosinophils Relative: 3 %
HCT: 48.8 % (ref 39.0–52.0)
Hemoglobin: 17 g/dL (ref 13.0–17.0)
Immature Granulocytes: 0 %
Lymphocytes Relative: 33 %
Lymphs Abs: 3.4 10*3/uL (ref 0.7–4.0)
MCH: 33 pg (ref 26.0–34.0)
MCHC: 34.8 g/dL (ref 30.0–36.0)
MCV: 94.8 fL (ref 80.0–100.0)
Monocytes Absolute: 1.1 10*3/uL — ABNORMAL HIGH (ref 0.1–1.0)
Monocytes Relative: 11 %
Neutro Abs: 5.2 10*3/uL (ref 1.7–7.7)
Neutrophils Relative %: 52 %
Platelets: 247 10*3/uL (ref 150–400)
RBC: 5.15 MIL/uL (ref 4.22–5.81)
RDW: 12.3 % (ref 11.5–15.5)
WBC: 10.2 10*3/uL (ref 4.0–10.5)
nRBC: 0 % (ref 0.0–0.2)

## 2023-12-16 LAB — POCT URINE DRUG SCREEN - MANUAL ENTRY (I-SCREEN)
POC Amphetamine UR: NOT DETECTED
POC Buprenorphine (BUP): NOT DETECTED
POC Cocaine UR: NOT DETECTED
POC Marijuana UR: NOT DETECTED
POC Methadone UR: NOT DETECTED
POC Methamphetamine UR: NOT DETECTED
POC Morphine: NOT DETECTED
POC Oxazepam (BZO): POSITIVE — AB
POC Oxycodone UR: NOT DETECTED
POC Secobarbital (BAR): NOT DETECTED

## 2023-12-16 LAB — GLUCOSE, CAPILLARY: Glucose-Capillary: 215 mg/dL — ABNORMAL HIGH (ref 70–99)

## 2023-12-16 LAB — HEMOGLOBIN A1C
Hgb A1c MFr Bld: 9.6 % — ABNORMAL HIGH (ref 4.8–5.6)
Mean Plasma Glucose: 228.82 mg/dL

## 2023-12-16 MED ORDER — DIPHENHYDRAMINE HCL 50 MG PO CAPS: 50.0000 mg | ORAL_CAPSULE | Freq: Three times a day (TID) | ORAL | Status: DC | PRN

## 2023-12-16 MED ORDER — ADULT MULTIVITAMIN W/MINERALS CH
1.0000 | ORAL_TABLET | Freq: Every day | ORAL | Status: DC
  Administered 2023-12-17 – 2023-12-18 (×2): 1 via ORAL
  Filled 2023-12-16 (×2): qty 1

## 2023-12-16 MED ORDER — LOPERAMIDE HCL 2 MG PO CAPS: 2.0000 mg | ORAL_CAPSULE | ORAL | Status: DC | PRN

## 2023-12-16 MED ORDER — ACETAMINOPHEN 325 MG PO TABS: 650.0000 mg | ORAL_TABLET | Freq: Four times a day (QID) | ORAL | Status: DC | PRN

## 2023-12-16 MED ORDER — ALUM & MAG HYDROXIDE-SIMETH 200-200-20 MG/5ML PO SUSP: 30.0000 mL | ORAL | Status: DC | PRN

## 2023-12-16 MED ORDER — HYDROXYZINE HCL 25 MG PO TABS
25.0000 mg | ORAL_TABLET | Freq: Four times a day (QID) | ORAL | Status: DC | PRN
  Administered 2023-12-16: 25 mg via ORAL
  Filled 2023-12-16: qty 1

## 2023-12-16 MED ORDER — THIAMINE HCL 100 MG/ML IJ SOLN
100.0000 mg | Freq: Once | INTRAMUSCULAR | Status: AC
  Administered 2023-12-16: 100 mg via INTRAMUSCULAR
  Filled 2023-12-16: qty 2

## 2023-12-16 MED ORDER — DIPHENHYDRAMINE HCL 50 MG/ML IJ SOLN: 50.0000 mg | Freq: Three times a day (TID) | INTRAMUSCULAR | Status: DC | PRN

## 2023-12-16 MED ORDER — MAGNESIUM HYDROXIDE 400 MG/5ML PO SUSP: 30.0000 mL | Freq: Every day | ORAL | Status: DC | PRN

## 2023-12-16 MED ORDER — METHOCARBAMOL 500 MG PO TABS: 500.0000 mg | ORAL_TABLET | Freq: Three times a day (TID) | ORAL | Status: DC | PRN

## 2023-12-16 MED ORDER — HALOPERIDOL LACTATE 5 MG/ML IJ SOLN: 5.0000 mg | Freq: Three times a day (TID) | INTRAMUSCULAR | Status: DC | PRN

## 2023-12-16 MED ORDER — ONDANSETRON 4 MG PO TBDP: 4.0000 mg | ORAL_TABLET | Freq: Four times a day (QID) | ORAL | Status: DC | PRN

## 2023-12-16 MED ORDER — TRAZODONE HCL 50 MG PO TABS
50.0000 mg | ORAL_TABLET | Freq: Every evening | ORAL | Status: DC | PRN
  Administered 2023-12-17: 50 mg via ORAL
  Filled 2023-12-16: qty 1

## 2023-12-16 MED ORDER — HALOPERIDOL 5 MG PO TABS: 5.0000 mg | ORAL_TABLET | Freq: Three times a day (TID) | ORAL | Status: DC | PRN

## 2023-12-16 MED ORDER — DICYCLOMINE HCL 20 MG PO TABS: 20.0000 mg | ORAL_TABLET | Freq: Four times a day (QID) | ORAL | Status: DC | PRN

## 2023-12-16 MED ORDER — HYDROXYZINE HCL 25 MG PO TABS: 25.0000 mg | ORAL_TABLET | Freq: Four times a day (QID) | ORAL | Status: DC | PRN

## 2023-12-16 MED ORDER — INSULIN ASPART 100 UNIT/ML IJ SOLN
0.0000 [IU] | Freq: Every day | INTRAMUSCULAR | Status: DC
  Administered 2023-12-16: 2 [IU] via SUBCUTANEOUS
  Administered 2023-12-17: 3 [IU] via SUBCUTANEOUS

## 2023-12-16 MED ORDER — THIAMINE MONONITRATE 100 MG PO TABS
100.0000 mg | ORAL_TABLET | Freq: Every day | ORAL | Status: DC
  Administered 2023-12-17 – 2023-12-18 (×2): 100 mg via ORAL
  Filled 2023-12-16 (×2): qty 1

## 2023-12-16 MED ORDER — LORAZEPAM 1 MG PO TABS: 1.0000 mg | ORAL_TABLET | Freq: Four times a day (QID) | ORAL | Status: DC | PRN

## 2023-12-16 MED ORDER — NAPROXEN 500 MG PO TABS: 500.0000 mg | ORAL_TABLET | Freq: Two times a day (BID) | ORAL | Status: DC | PRN

## 2023-12-16 MED ORDER — INSULIN ASPART 100 UNIT/ML IJ SOLN
0.0000 [IU] | Freq: Three times a day (TID) | INTRAMUSCULAR | Status: DC
  Administered 2023-12-17: 4 [IU] via SUBCUTANEOUS
  Administered 2023-12-17: 3 [IU] via SUBCUTANEOUS
  Administered 2023-12-17: 7 [IU] via SUBCUTANEOUS
  Administered 2023-12-18 (×2): 5 [IU] via SUBCUTANEOUS

## 2023-12-16 NOTE — ED Notes (Signed)
 Patient admitted into [facility] involuntary from [facility] for ETOH detox. Patient A/OX4 MAE. Answers all questions appropriately. Skin assessment WNL. NAD. Oriented to unit and unit rules. Food and drink provided by [facility] per request. Patients Denies SI/HI/AVH. No complaints at the moment. Showed his room and patient went to sleep. Will keep monitoring for safety.

## 2023-12-17 DIAGNOSIS — F3130 Bipolar disorder, current episode depressed, mild or moderate severity, unspecified: Secondary | ICD-10-CM | POA: Diagnosis not present

## 2023-12-17 DIAGNOSIS — F319 Bipolar disorder, unspecified: Secondary | ICD-10-CM | POA: Diagnosis present

## 2023-12-17 DIAGNOSIS — E1110 Type 2 diabetes mellitus with ketoacidosis without coma: Secondary | ICD-10-CM | POA: Diagnosis not present

## 2023-12-17 DIAGNOSIS — F1010 Alcohol abuse, uncomplicated: Secondary | ICD-10-CM | POA: Diagnosis not present

## 2023-12-17 DIAGNOSIS — F4310 Post-traumatic stress disorder, unspecified: Secondary | ICD-10-CM | POA: Diagnosis not present

## 2023-12-17 LAB — GLUCOSE, CAPILLARY
Glucose-Capillary: 342 mg/dL — ABNORMAL HIGH (ref 70–99)
Glucose-Capillary: 253 mg/dL — ABNORMAL HIGH (ref 70–99)
Glucose-Capillary: 345 mg/dL — ABNORMAL HIGH (ref 70–99)
Glucose-Capillary: 270 mg/dL — ABNORMAL HIGH (ref 70–99)

## 2023-12-17 MED ORDER — BUSPIRONE HCL 10 MG PO TABS
10.0000 mg | ORAL_TABLET | Freq: Two times a day (BID) | ORAL | Status: DC
  Administered 2023-12-17 – 2023-12-18 (×3): 10 mg via ORAL
  Filled 2023-12-17 (×3): qty 2

## 2023-12-17 MED ORDER — LABETALOL HCL 100 MG PO TABS
100.0000 mg | ORAL_TABLET | Freq: Two times a day (BID) | ORAL | Status: DC
  Administered 2023-12-17 – 2023-12-18 (×3): 100 mg via ORAL
  Filled 2023-12-17 (×3): qty 1

## 2023-12-17 MED ORDER — LITHIUM CARBONATE ER 300 MG PO TBCR
300.0000 mg | EXTENDED_RELEASE_TABLET | Freq: Two times a day (BID) | ORAL | Status: DC
  Administered 2023-12-17 – 2023-12-18 (×3): 300 mg via ORAL
  Filled 2023-12-17 (×3): qty 1

## 2023-12-17 NOTE — ED Notes (Signed)
 Patient came to this nurse requesting to leave, he states he was told earlier by the dayshift social worker that he would be able to leave today. He says he has been trying to talk to the dayshift provider all day but has not been successful. However he says the social worker told him to speak with staff on the 7pm shift and inform us  of what she told him so that he would be discharged tonight. This nurse advised the patient that per what I was informed in change of shift report he would be discharged tomorrow. The patient stated that he needs to leave today because his wife has been waiting on him all day and she has cancer. This nurse advised the patient that I will advise Joanette Moynahan the night shift provider however I can not make any promises that he will be discharged tonight.

## 2023-12-17 NOTE — Group Note (Signed)
 Group Topic: Relapse and Recovery  Group Date: 12/17/2023 Start Time: 1000 End Time: 1100 Facilitators: Milan Alfred, NT 3, M[facility] 2 Department: [facility]  Number of Participants: 7  Group Focus: relapse prevention Treatment Modality:  Solution-Focused Therapy Interventions utilized were problem solving Purpose: relapse prevention strategies  Name: Samuel Williams Date of Birth: 1970/10/29  MR: [id number]    Level of Participation: active Quality of Participation: engaged Interactions with others: gave feedback Mood/Affect: positive Triggers (if applicable): Discussed how to avoid triggers Cognition: coherent/clear Progress: Moderate Response: Appropriate  Plan: patient will be encouraged to continue to work hard and be positive.  Patients Problems:  Patient Active Problem List   Diagnosis Date Noted   MDD (major depressive disorder), recurrent severe, 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

## 2023-12-17 NOTE — ED Notes (Signed)
 Pt calm and sleeping in the bedroom, NAD.  Will monitor for safety.

## 2023-12-17 NOTE — Discharge Planning (Signed)
 SW made call to patients wife at [phone] after patient made SW aware we had the wrong number for her listed. Patients wife confirmed no concerns with patient returning home and voiced that he really did need to return home to assist with his mother's care. Wife will be planning to pick up patient at time of DC. Will continue to follow.

## 2023-12-17 NOTE — BH Assessment (Signed)
 LCSW met with patient for assessment and discussion of disposition of plans. Patient presented to [facility] with ETOH use and SI. Patient stated that he has had many new stressors in life with his mother's illness and discovering wife has cancer. Patient stated that on the day he decided to come to the hospital he had drinking about 8 beers. He stated on average he normally drinks about 2-3 beers a couple times a week and other times he doesn't drink for several weeks.  He stated recent worsening of depression due to family concerns. He does have a primary care physician, Reeves Canter through [facility] and takes buspar and an ativan . Patient lives with his wife and 3 adult children. He is disabled from work and does drive. His primary role is going over to his mother's home to check and provide care. He denied having any further SI. He reports to be feeling better and is ready to return home. He stated his mother was coming back home from a recent visit with family in [place] today and would like to be there when she returns. Patient has attended AA meetings in his community and plans to continue them. He would like a referral for individual therapy on an outpatient basis. His medications are managed through his PCP. Will assist with referral. Patient also gave consent to make collater call to his wife. This LCSW made call to wife, Moira Andrews @ [phone] and left voice mail to return call to SW. Will continue to follow and provide supports.

## 2023-12-17 NOTE — Group Note (Signed)
 Group Topic: Emotional Regulation  Group Date: 12/17/2023 Start Time: 2030 End Time: 2100 Facilitators: Alvino Joseph, NT  Department: [facility]  Number of Participants: 9  Group Focus: coping skills Treatment Modality:  Individual Therapy Interventions utilized were assignment Purpose: enhance coping skills  Name: Samuel Williams Date of Birth: [birth year]  MR: [id number]    Level of Participation: moderate Quality of Participation: cooperative Interactions with others: gave feedback Mood/Affect: appropriate Triggers (if applicable): bars, brother, barbecues Cognition: coherent/clear Progress: Moderate Response: coping- not be around brother, leave area Plan: follow-up needed  Patients Problems:  Patient Active Problem List   Diagnosis Date Noted   Bipolar disorder (HCC) 12/17/2023   Alcohol abuse 12/17/2023   PTSD (post-traumatic stress disorder) 12/17/2023   MDD (major depressive disorder), recurrent severe, 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

## 2023-12-17 NOTE — ED Notes (Signed)
 Patient is lying in his room with eyes closed, patient patient appears to be in no acute distress, respirations are even and unlabored, will continue to monitor patient for safety.

## 2023-12-17 NOTE — Group Note (Signed)
 Group Topic: Recovery Basics  Group Date: 12/17/2023 Start Time: 1130 End Time: 1200 Facilitators: Arlan Belling, RN  Department: [facility]  Number of Participants: 9  Group Focus: chemical dependency education and chemical dependency issues Treatment Modality:  Cognitive Behavioral Therapy Interventions utilized were clarification, exploration, group exercise, and patient education Purpose: enhance coping skills, explore maladaptive thinking, express feelings, express irrational fears, improve communication skills, increase insight, regain self-worth, reinforce self-care, and relapse prevention strategies  Name: Samuel Williams Date of Birth: 01-30-1971  MR: [id number]    Level of Participation: moderate Quality of Participation: attentive and cooperative Interactions with others: gave feedback Mood/Affect: appropriate Triggers (if applicable):   Cognition: coherent/clear and goal directed Progress: Moderate Response:   Plan: follow-up needed  Patients Problems:  Patient Active Problem List   Diagnosis Date Noted   MDD (major depressive disorder), recurrent severe, 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

## 2023-12-17 NOTE — ED Notes (Signed)
 Patient is awake and alert on unit without distress or complaint.  Patient is not showing signs of withdrawal at this time.  He is calm and cooperative, social with peers, makes needs known to staff.  Will monitor and provide safe supportive environment.
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
## 2023-12-18 DIAGNOSIS — F4310 Post-traumatic stress disorder, unspecified: Secondary | ICD-10-CM | POA: Diagnosis not present

## 2023-12-18 DIAGNOSIS — E1110 Type 2 diabetes mellitus with ketoacidosis without coma: Secondary | ICD-10-CM | POA: Diagnosis not present

## 2023-12-18 DIAGNOSIS — F1010 Alcohol abuse, uncomplicated: Secondary | ICD-10-CM | POA: Diagnosis not present

## 2023-12-18 DIAGNOSIS — F3130 Bipolar disorder, current episode depressed, mild or moderate severity, unspecified: Secondary | ICD-10-CM | POA: Diagnosis not present

## 2023-12-18 LAB — GLUCOSE, CAPILLARY
Glucose-Capillary: 288 mg/dL — ABNORMAL HIGH (ref 70–99)
Glucose-Capillary: 295 mg/dL — ABNORMAL HIGH (ref 70–99)

## 2023-12-18 MED ORDER — HYDROXYZINE HCL 25 MG PO TABS: 25.0000 mg | ORAL_TABLET | Freq: Four times a day (QID) | ORAL | 0 refills | Status: AC | PRN

## 2023-12-18 MED ORDER — DICYCLOMINE HCL 20 MG PO TABS: 20.0000 mg | ORAL_TABLET | Freq: Four times a day (QID) | ORAL | 0 refills | Status: AC | PRN

## 2023-12-18 MED ORDER — LITHIUM CARBONATE ER 300 MG PO TBCR: 300.0000 mg | EXTENDED_RELEASE_TABLET | Freq: Two times a day (BID) | ORAL | 0 refills | Status: AC

## 2023-12-18 MED ORDER — LABETALOL HCL 100 MG PO TABS: 100.0000 mg | ORAL_TABLET | Freq: Two times a day (BID) | ORAL | 0 refills | Status: AC

## 2023-12-18 MED ORDER — BUSPIRONE HCL 10 MG PO TABS: 10.0000 mg | ORAL_TABLET | Freq: Two times a day (BID) | ORAL | 0 refills | Status: AC

## 2023-12-18 MED ORDER — TRAZODONE HCL 50 MG PO TABS: 50.0000 mg | ORAL_TABLET | Freq: Every evening | ORAL | 0 refills | Status: AC | PRN

## 2023-12-18 NOTE — ED Notes (Signed)
 Patient discharged home per MD order. After Visit Summary (AVS) printed and given to patient, as well as printed prescriptions. AVS reviewed with patient and all questions fully answered. Patient discharged in no acute distress, A& O x4 and ambulatory. Patient denied SI/HI, A/VH upon discharge. Patient verbalized understanding of all discharge instructions explained by staff, including follow up appointments, RX's and safety plan. Patient mood fair. Patient belongings returned to patient from locker #23 complete and intact. Patient escorted to lobby via staff for transport to destination. Safety maintained.

## 2023-12-18 NOTE — Group Note (Signed)
 Group Topic: Fears and Unhealthy Coping Skills  Group Date: 12/18/2023 Start Time: 0945 End Time: 1100 Facilitators: Dennis Fitting, NT  Department: [facility]  Number of Participants: 9  Group Focus: coping skills, personal responsibility, self-awareness, and social skills Treatment Modality:  Psychoeducation Interventions utilized were clarification, exploration, problem solving, and support Purpose: enhance coping skills, explore maladaptive thinking, regain self-worth, and reinforce self-care  Name: Samuel Williams Date of Birth: 10-Oct-1970  MR: [id number]    Level of Participation: minimal Quality of Participation: quiet Interactions with others: Interactions were limited  Mood/Affect: appropriate Triggers (if applicable): N/A Cognition: coherent/clear Progress: Gaining insight Response: Patient was waiting to leave, so he joined group briefly before leaving to prepare for dx. He did not offer verbal communication. 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

## 2023-12-18 NOTE — ED Provider Notes (Signed)
 FBC/OBS ASAP Discharge Summary  Date and Time: 12/18/2023 10:35 AM  Name: Samuel Williams  MRN:  [id number]   Discharge Diagnoses:  Final diagnoses:  DM (diabetes mellitus) type 2, uncontrolled, with ketoacidosis (HCC)  Alcohol abuse [F10.10]  Bipolar affective disorder, current episode depressed, current episode severity unspecified (HCC) [F31.30]  PTSD (post-traumatic stress disorder) [F43.10]    Patient admitted into [facility] involuntary from [facility] for ETOH detox.    Subjective: Chart reviewed, case discussed with RN and Child psychotherapist, patient seen during rounds.  Patient is focused on going home.  There was some confusion about patient being oriented admitted on IVC.  Patient was changed to voluntary status today.  Today patient denies thoughts of harming him self or others.  He denies psychotic symptoms.  Social worker confirmed that patient's wife is in agreement with patient discharge today.  Patient is not in any imminent danger to self or.  He was discharged to home after stabilization of acute crisis  Stay Summary: Patient was admitted to locked unit at [facility].  His stay was short.  Patient had talked to social worker while on the unit to discuss outpatient rehab options.  Patient is in agreement to enroll in IOP program.  Patient was also started on lithium, he reported lithium has helped him in the past.  Patient was given 15-day supply of medicine.  He was encouraged to repeat lithium level in 3 to 4 days upon discharge.  Patient was encouraged to keep himself hydrated and avoid ibuprofen.  Patient reports that he will get the lab orders from his PCP.  Patient was encouraged to work on sobriety and coping strategies   Past Psychiatric History: Past Psychiatric History: Patient reports that he has been admitted to [facility] in the past, and has been evaluated at [facility].  He has been diagnosed with bipolar disorder and PTSD.  Patient has 1 previous history of suicide attempt about 20 years ago where  he tried to kill himself with an "gun, patient said his sister stopped him".  Past Medical History: Patient has history of hypertension and diabetes mellitus Family History: Patient denies family history of psychiatric illness Social History: Patient is married.  He lives with his wife patient reports that he is on disability  Tobacco Cessation:  N/A, patient does not currently use tobacco products  Current Medications:  Current Facility-Administered Medications  Medication Dose Route Frequency Provider Last Rate Last Admin   acetaminophen  (TYLENOL ) tablet 650 mg  650 mg Oral Q6H PRN Lewis, Tanika N, NP       alum & mag hydroxide-simeth (MAALOX/MYLANTA) 200-200-20 MG/5ML suspension 30 mL  30 mL Oral Q4H PRN Lewis, Tanika N, NP       busPIRone (BUSPAR) tablet 10 mg  10 mg Oral BID Sayer Masini, MD   10 mg at 12/18/23 0950   dicyclomine (BENTYL) tablet 20 mg  20 mg Oral Q6H PRN Levester Reagin, NP       haloperidol (HALDOL) tablet 5 mg  5 mg Oral TID PRN Lewis, Tanika N, NP       And   diphenhydrAMINE (BENADRYL) capsule 50 mg  50 mg Oral TID PRN Levester Reagin, NP       haloperidol lactate (HALDOL) injection 5 mg  5 mg Intramuscular TID PRN Lewis, Tanika N, NP       And   diphenhydrAMINE (BENADRYL) injection 50 mg  50 mg Intramuscular TID PRN Lewis, Tanika N, NP  hydrOXYzine  (ATARAX ) tablet 25 mg  25 mg Oral Q6H PRN Lewis, Tanika N, NP   25 mg at 12/16/23 2236   hydrOXYzine  (ATARAX ) tablet 25 mg  25 mg Oral Q6H PRN Lewis, Tanika N, NP       insulin  aspart (novoLOG ) injection 0-5 Units  0-5 Units Subcutaneous QHS Levester Reagin, NP   3 Units at 12/17/23 2114   insulin  aspart (novoLOG ) injection 0-9 Units  0-9 Units Subcutaneous TID WC Levester Reagin, NP   5 Units at 12/18/23 8416   labetalol (NORMODYNE) tablet 100 mg  100 mg Oral BID Silas Drivers, MD   100 mg at 12/18/23 0951   lithium carbonate (LITHOBID) ER tablet 300 mg  300 mg Oral BID Raesean Bartoletti, MD   300 mg at  12/18/23 6063   loperamide  (IMODIUM ) capsule 2-4 mg  2-4 mg Oral PRN Lewis, Tanika N, NP       loperamide  (IMODIUM ) capsule 2-4 mg  2-4 mg Oral PRN Lewis, Tanika N, NP       LORazepam  (ATIVAN ) tablet 1 mg  1 mg Oral Q6H PRN Levester Reagin, NP       magnesium  hydroxide (MILK OF MAGNESIA) suspension 30 mL  30 mL Oral Daily PRN Lewis, Tanika N, NP       methocarbamol (ROBAXIN) tablet 500 mg  500 mg Oral Q8H PRN Levester Reagin, NP       multivitamin with minerals tablet 1 tablet  1 tablet Oral Daily Lewis, Tanika N, NP   1 tablet at 12/18/23 0950   ondansetron  (ZOFRAN -ODT) disintegrating tablet 4 mg  4 mg Oral Q6H PRN Levester Reagin, NP       thiamine  (VITAMIN B1) tablet 100 mg  100 mg Oral Daily Lewis, Tanika N, NP   100 mg at 12/18/23 0951   traZODone  (DESYREL ) tablet 50 mg  50 mg Oral QHS PRN Lewis, Tanika N, NP   50 mg at 12/17/23 2112   Current Outpatient Medications  Medication Sig Dispense Refill   insulin  lispro (HUMALOG KWIKPEN) 100 UNIT/ML KwikPen Inject 4-18 Units into the skin See admin instructions. Inject 4-18 units subcutaneously twice daily as needed for CBG >150 (per sliding scale based on sugars)     losartan (COZAAR) 100 MG tablet Take 100 mg by mouth daily.     albuterol (VENTOLIN HFA) 108 (90 Base) MCG/ACT inhaler Inhale into the lungs every 6 (six) hours as needed for wheezing or shortness of breath. (Patient not taking: Reported on 12/17/2023)     busPIRone (BUSPAR) 10 MG tablet Take 1 tablet (10 mg total) by mouth 2 (two) times daily. 60 tablet 0   dicyclomine (BENTYL) 20 MG tablet Take 1 tablet (20 mg total) by mouth every 6 (six) hours as needed for spasms (abdominal cramping). 30 tablet 0   hydrOXYzine  (ATARAX ) 25 MG tablet Take 1 tablet (25 mg total) by mouth every 6 (six) hours as needed for anxiety. 30 tablet 0   labetalol (NORMODYNE) 100 MG tablet Take 1 tablet (100 mg total) by mouth 2 (two) times daily. 60 tablet 0   lithium carbonate (LITHOBID) 300 MG ER tablet Take  1 tablet (300 mg total) by mouth 2 (two) times daily. 30 tablet 0   nitroGLYCERIN  (NITROSTAT ) 0.4 MG SL tablet Place 0.4 mg under the tongue every 5 (five) minutes as needed for chest pain. (Patient not taking: Reported on 12/17/2023)     traZODone  (DESYREL ) 50 MG tablet Take 1 tablet (50  mg total) by mouth at bedtime as needed for sleep. 30 tablet 0    PTA Medications:  Facility Ordered Medications  Medication   acetaminophen  (TYLENOL ) tablet 650 mg   alum & mag hydroxide-simeth (MAALOX/MYLANTA) 200-200-20 MG/5ML suspension 30 mL   magnesium  hydroxide (MILK OF MAGNESIA) suspension 30 mL   dicyclomine (BENTYL) tablet 20 mg   hydrOXYzine  (ATARAX ) tablet 25 mg   loperamide  (IMODIUM ) capsule 2-4 mg   methocarbamol (ROBAXIN) tablet 500 mg   insulin  aspart (novoLOG ) injection 0-9 Units   insulin  aspart (novoLOG ) injection 0-5 Units   [COMPLETED] thiamine  (VITAMIN B1) injection 100 mg   thiamine  (VITAMIN B1) tablet 100 mg   multivitamin with minerals tablet 1 tablet   LORazepam  (ATIVAN ) tablet 1 mg   hydrOXYzine  (ATARAX ) tablet 25 mg   loperamide  (IMODIUM ) capsule 2-4 mg   ondansetron  (ZOFRAN -ODT) disintegrating tablet 4 mg   haloperidol (HALDOL) tablet 5 mg   And   diphenhydrAMINE (BENADRYL) capsule 50 mg   haloperidol lactate (HALDOL) injection 5 mg   And   diphenhydrAMINE (BENADRYL) injection 50 mg   traZODone  (DESYREL ) tablet 50 mg   labetalol (NORMODYNE) tablet 100 mg   busPIRone (BUSPAR) tablet 10 mg   lithium carbonate (LITHOBID) ER tablet 300 mg   PTA Medications  Medication Sig   insulin  lispro (HUMALOG KWIKPEN) 100 UNIT/ML KwikPen Inject 4-18 Units into the skin See admin instructions. Inject 4-18 units subcutaneously twice daily as needed for CBG >150 (per sliding scale based on sugars)   losartan (COZAAR) 100 MG tablet Take 100 mg by mouth daily.   nitroGLYCERIN  (NITROSTAT ) 0.4 MG SL tablet Place 0.4 mg under the tongue every 5 (five) minutes as needed for chest pain.  (Patient not taking: Reported on 12/17/2023)   albuterol (VENTOLIN HFA) 108 (90 Base) MCG/ACT inhaler Inhale into the lungs every 6 (six) hours as needed for wheezing or shortness of breath. (Patient not taking: Reported on 12/17/2023)   labetalol (NORMODYNE) 100 MG tablet Take 1 tablet (100 mg total) by mouth 2 (two) times daily.   busPIRone (BUSPAR) 10 MG tablet Take 1 tablet (10 mg total) by mouth 2 (two) times daily.   hydrOXYzine  (ATARAX ) 25 MG tablet Take 1 tablet (25 mg total) by mouth every 6 (six) hours as needed for anxiety.   lithium carbonate (LITHOBID) 300 MG ER tablet Take 1 tablet (300 mg total) by mouth 2 (two) times daily.   traZODone  (DESYREL ) 50 MG tablet Take 1 tablet (50 mg total) by mouth at bedtime as needed for sleep.   dicyclomine (BENTYL) 20 MG tablet Take 1 tablet (20 mg total) by mouth every 6 (six) hours as needed for spasms (abdominal cramping).       12/18/2023    9:34 AM 12/17/2023    2:30 PM  Depression screen PHQ 2/9  Decreased Interest 0 0  Down, Depressed, Hopeless 0 0  PHQ - 2 Score 0 0  Altered sleeping 0 0  Tired, decreased energy 0 0  Change in appetite 0 0  Feeling bad or failure about yourself  0 0  Trouble concentrating 0 0  Moving slowly or fidgety/restless 0 0  Suicidal thoughts 0 0  PHQ-9 Score 0 0  Difficult doing work/chores Not difficult at all Not difficult at all    [facility] ED from 12/16/2023 in [facility] ED from 11/09/2023 in [facility] ED from 10/19/2023 in [facility]  C-SSRS RISK CATEGORY No  Risk No Risk No Risk       Musculoskeletal  Strength & Muscle Tone: within normal limits Gait & Station: normal Patient leans: N/A  Psychiatric Specialty Exam  Presentation General Appearance: Appropriate for Environment   Eye Contact:Fair   Speech:Clear and Coherent   Speech Volume:Normal     Mood and Affect  Mood: "good"   Affect:  Animated     Thought Process  Thought Processes:Coherent   Descriptions of Associations:Intact   Orientation:Full (Time, Place and Person)   Thought Content:Abstract Reasoning    Hallucinations:Hallucinations: None   Ideas of Reference:None   Suicidal Thoughts: Denies   Homicidal Thoughts:Homicidal Thoughts: No     Sensorium  Memory:Immediate Fair; Recent Fair   Judgment:Improved   Insight: Improved    Executive Functions  Concentration:Fair   Attention Span:Fair   Recall:Fair   Fund of Knowledge:Fair   Language:Fair     Psychomotor Activity  Psychomotor Activity:Psychomotor Activity: Decreased     Assets  Assets:Communication Skills; Desire for Improvement; Housing; Health and safety inspector; Social Support     Sleep  Sleep:Sleep: Fair     No data recorded   Physical Exam Constitutional:      Appearance: Normal appearance.  HENT:     Head: Normocephalic and atraumatic.     Nose: No congestion.  Eyes:     Pupils: Pupils are equal, round, and reactive to light.  Cardiovascular:     Rate and Rhythm: Regular rhythm.  Pulmonary:     Effort: Pulmonary effort is normal.  Skin:    General: Skin is warm.  Neurological:     Mental Status: He is alert and oriented to person, place, and time.      Review of Systems  Constitutional:  Negative for chills and fever.  HENT:  Negative for hearing loss and sore throat.   Eyes:  Negative for blurred vision and double vision.  Respiratory:  Negative for cough and shortness of breath.   Cardiovascular:  Negative for chest pain and palpitations.  Gastrointestinal:  Negative for nausea and vomiting.  Neurological:  Negative for dizziness and seizures.   Blood pressure (!) 140/92, pulse 94, temperature 98.2 F (36.8 C), temperature source Oral, resp. rate 17, SpO2 98%. There is no height or weight on file to calculate BMI.  Demographic Factors:  Male and Unemployed  Loss Factors: Spouse's  illness  Historical Factors: NA  Risk Reduction Factors:   Sense of responsibility to family, Positive social support, and Positive therapeutic relationship  Continued Clinical Symptoms:  Alcohol/Substance Abuse/Dependencies Previous Psychiatric Diagnoses and Treatments  Cognitive Features That Contribute To Risk:  Thought constriction (tunnel vision)    Suicide Risk:  Minimal: No identifiable suicidal ideation.   Plan Of Care/Follow-up recommendations:  Activity:  As tolerated Diet:  Cardiac healthy Other:  Patient was encouraged to abstain use of alcohol and illicit drug, he was encouraged to work on his sobriety and coping strategies  Disposition: Home  Silas Drivers, MD

## 2023-12-18 NOTE — ED Notes (Signed)
 Patient sitting in dayroom interacting with peers. No acute distress noted. No concerns voiced. Informed patient to notify staff with any needs or assistance. Patient verbalized understanding or agreement. Safety checks in place per facility policy.

## 2023-12-18 NOTE — Group Note (Signed)
 Group Topic: Relapse and Recovery  Group Date: 12/18/2023 Start Time: 1215 End Time: 1223 Facilitators: Reata Petrov, [place]us [place]oll, RN  Department: [facility]  Number of Participants: 1  Group Focus: discharge education Treatment Modality:  Individual Therapy Interventions utilized were patient education Purpose: increase insight  Name: Samuel Williams Date of Birth: 08/20/1970  MR: [id number]    Level of Participation: active Quality of Participation: attentive, cooperative, and engaged Interactions with others: gave feedback Mood/Affect: appropriate Triggers (if applicable): None identified Cognition: coherent/clear, goal directed, insightful, and logical Progress: Significant Response: Patient voiced understanding of all discharge instructions as presented to him. AVS, RX's, and suicide safety plan reviewed. No questions at this time. 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

## 2023-12-18 NOTE — ED Notes (Signed)
 Pt is currently sleeping, no distress noted, environmental check complete, will continue to monitor patient for safety.

## 2023-12-18 NOTE — Discharge Instructions (Addendum)
[employer] [facility] in [place], [place] Filters1  TiffanyPerrigo  Clinical Social Work/Therapist, MSW, LCSWA Verified 1 Endorsed Online Only A strong therapeutic relationship is all about working together to create real change. My goal will be to help you move forward from past traumas or life events, feel your feelings and make positive steps for your future. I know finding a therapist and starting the process can be overwhelming, so my hope is to create a space where you feel safe, comfortable, and open to learning and growth. [phone]  Eliane Grooms) [place] Licensed Clinical Mental Health Counselor, [facility] Verified [place], [place] [address] I accept a variety of insurances to include Medicaid through [place], [employer] [place], [employer], [employer], and [place] as well as direct (out-of-pocket) payment. There are times in everyone's life when navigating day-to-day struggles and problems seem more than we can do on our own. Sometimes, we just need someone to talk to who will listen, not judge, and help us  to find our way again. [phone]  Reeves Canter Clinical Social Work/Therapist, LCSW, LCAS, CCS, BCD, CCM Verified [place], [place] [address] As a therapist, I understand that life's challenges are deeply personal and unique to each individual. My ideal clients are those who feel stuck--whether due to past trauma, substance use, family or relationship struggles, or legal and custody challenges--and are seeking a way to regain control and purpose. You might feel overwhelmed by the weight of life's responsibilities or held back by unresolved issues, yet you are ready to explore ways to move forward. Together, we can identify your strengths, uncover what holds you back, and create a plan to help you thrive . [phone]  [facility] Licensed Professional Counselor, LPC Verified [place], [place] [address] I am provisionally licensed as a  Pharmacist, hospital. I started [facility] to help those individuals and families in need specializing in At Risk/Special Needs youth. Abraham Hoffmann is [employer] certified utilizing horses experientially for mental health treatment and human development and has committed to uphold [employer] [phone]  Tiffany Folsom [facility] Social Work/Therapist, MSW, LCSW, CFSW Verified [place], [place] [address] An ideal client for me is someone is is willing and ready to make lasting and impactful changes in their lives. An individuals goals can be fluid and changing and I enjoy assisting clients in exploring what their goals for therapy, life, and the future are. [phone]  Delvin File Clinical Social Work/Therapist, MSW, LCSW Verified 5 Endorsed [place], [place] [address] Waitlist for new clients Are you feeling overwhelmed by the challenges in your life? Are the demands of your relationships, family, work, or school getting you down? If you are facing an unexpected life transition such as divorce, health issues, or Women's issues, I can help. Contact me today to schedule a 15-minute consultation. I have worked in Scientist, research (physical sciences) for over 20 years. [phone]  Jaymes Mew Clinical Social Work/Therapist, LCSW Verified Online Only I am a Licensed Visual merchandiser (LCSW) serving clients of ages 5+. My approach is eclectic, utilizing a variety of therapies including behavioral, cognitive, somatic, art, play, and internal communication techniques to create a personalized & effective path to healing. I specialize in : Trauma Recovery, Dissociation related to Trauma, Autism, ADHD, Depression, and Anxiety. In our 1st sessions, we will get to know each other, establish coping tools, & develop a plan for your healing journey. I encourage laughter, self expression, & movement. I believe that everyone deserves to experience healing &  joy. (  [phone] 

## 2023-12-18 NOTE — ED Notes (Signed)
 Patient alert & oriented x4. Denies intent to harm self or others when asked. Denies A/VH. Patient denies any physical complaints when asked. No acute distress noted. Scheduled medications administered with no complications. Support and encouragement provided. Routine safety checks conducted per facility protocol. Encouraged patient to notify staff if any thoughts of harm towards self or others arise. Patient verbalizes understanding and agreement.

## 2024-07-01 ENCOUNTER — Emergency Department (HOSPITAL_COMMUNITY)
Admission: EM | Admit: 2024-07-01 | Discharge: 2024-07-01 | Discharge status: Against Medical Advice | Payer: MEDICAID | Attending: Emergency Medicine | Admitting: Emergency Medicine

## 2024-07-01 ENCOUNTER — Other Ambulatory Visit: Payer: Self-pay

## 2024-07-01 ENCOUNTER — Encounter (HOSPITAL_COMMUNITY): Payer: Self-pay

## 2024-07-01 DIAGNOSIS — F1010 Alcohol abuse, uncomplicated: Secondary | ICD-10-CM | POA: Insufficient documentation

## 2024-07-01 DIAGNOSIS — R10A1 Flank pain, right side: Secondary | ICD-10-CM | POA: Insufficient documentation

## 2024-07-01 DIAGNOSIS — R350 Frequency of micturition: Secondary | ICD-10-CM | POA: Insufficient documentation

## 2024-07-01 DIAGNOSIS — M546 Pain in thoracic spine: Secondary | ICD-10-CM | POA: Diagnosis not present

## 2024-07-01 DIAGNOSIS — Z5321 Procedure and treatment not carried out due to patient leaving prior to being seen by health care provider: Secondary | ICD-10-CM | POA: Insufficient documentation

## 2024-07-01 NOTE — ED Notes (Signed)
 Pt decided he wanted to leave due to family issues. Iv removed and pt walked out

## 2024-07-01 NOTE — ED Triage Notes (Signed)
 Pt bib ems from home c.o r flank pain x 1-2 weeks, frequent urination. Pt has hx of alcohol abuse. States he drank 8 beers today.  Pt also c.o mild back pain and shoulder pain. Pt ambulatory to ED.
